# Patient Record
Sex: Male | Born: 1955 | Race: White | Hispanic: No | Marital: Married
Health system: Southern US, Academic
[De-identification: ages and names within clinical notes are randomized; demographics above are authoritative.]

## PROBLEM LIST (undated history)

## (undated) ENCOUNTER — Encounter

## (undated) ENCOUNTER — Ambulatory Visit: Payer: MEDICARE

## (undated) ENCOUNTER — Encounter
Attending: Student in an Organized Health Care Education/Training Program | Primary: Student in an Organized Health Care Education/Training Program

## (undated) ENCOUNTER — Ambulatory Visit

## (undated) ENCOUNTER — Telehealth

## (undated) ENCOUNTER — Telehealth
Attending: Student in an Organized Health Care Education/Training Program | Primary: Student in an Organized Health Care Education/Training Program

## (undated) ENCOUNTER — Ambulatory Visit: Payer: Medicare (Managed Care)

## (undated) ENCOUNTER — Ambulatory Visit
Payer: MEDICARE | Attending: Student in an Organized Health Care Education/Training Program | Primary: Student in an Organized Health Care Education/Training Program

## (undated) DIAGNOSIS — B019 Varicella without complication: Secondary | ICD-10-CM

## (undated) DIAGNOSIS — K5790 Diverticulosis of intestine, part unspecified, without perforation or abscess without bleeding: Secondary | ICD-10-CM

## (undated) DIAGNOSIS — C4491 Basal cell carcinoma of skin, unspecified: Secondary | ICD-10-CM

## (undated) DIAGNOSIS — K635 Polyp of colon: Secondary | ICD-10-CM

## (undated) DIAGNOSIS — K649 Unspecified hemorrhoids: Secondary | ICD-10-CM

## (undated) DIAGNOSIS — E785 Hyperlipidemia, unspecified: Secondary | ICD-10-CM

## (undated) HISTORY — DX: Basal cell carcinoma of skin, unspecified: C44.91

## (undated) HISTORY — DX: Unspecified hemorrhoids: K64.9

## (undated) HISTORY — DX: Polyp of colon: K63.5

## (undated) HISTORY — PX: COLONOSCOPY: SHX174

## (undated) HISTORY — DX: Varicella without complication: B01.9

---

## 1978-07-12 HISTORY — PX: ACHILLES TENDON REPAIR: SUR1153

## 2003-09-09 ENCOUNTER — Encounter (INDEPENDENT_AMBULATORY_CARE_PROVIDER_SITE_OTHER): Payer: Self-pay | Admitting: Specialist

## 2003-09-09 ENCOUNTER — Ambulatory Visit (HOSPITAL_COMMUNITY): Admission: RE | Admit: 2003-09-09 | Discharge: 2003-09-09 | Payer: Self-pay | Admitting: Gastroenterology

## 2007-07-17 ENCOUNTER — Ambulatory Visit: Payer: Self-pay | Admitting: Internal Medicine

## 2007-07-17 DIAGNOSIS — Z860100 Personal history of colon polyps, unspecified: Secondary | ICD-10-CM | POA: Insufficient documentation

## 2007-07-17 DIAGNOSIS — E7800 Pure hypercholesterolemia, unspecified: Secondary | ICD-10-CM | POA: Insufficient documentation

## 2007-07-19 LAB — CONVERTED CEMR LAB
Cholesterol: 194 mg/dL (ref 0–200)
LDL Cholesterol: 145 mg/dL — ABNORMAL HIGH (ref 0–99)
PSA: 1.54 ng/mL (ref 0.10–4.00)

## 2007-08-10 ENCOUNTER — Telehealth (INDEPENDENT_AMBULATORY_CARE_PROVIDER_SITE_OTHER): Payer: Self-pay | Admitting: *Deleted

## 2010-11-27 NOTE — Op Note (Signed)
NAME:  Larry Richardson, Larry Richardson                     ACCOUNT NO.:  [id number]   MEDICAL RECORD NO.:  [id number]                   PATIENT TYPE:  AMB   LOCATION:  ENDO                                 FACILITY:  [facility]   PHYSICIAN:  Graylin Shiver, M.D.                DATE OF BIRTH:  January 18, 1956   DATE OF PROCEDURE:  09/09/2003  DATE OF DISCHARGE:                                 OPERATIVE REPORT   PROCEDURE:  Colonoscopy with biopsy.   INDICATION:  History of colon polyps.   Informed consent was obtained after explanation of the risks of bleeding,  infection, and perforation.   PREMEDICATIONS:  1. Fentanyl 50 mcg IV.  2. Versed 6 mg IV.   DESCRIPTION OF PROCEDURE:  With the patient in the left lateral decubitus  position, a rectal exam was performed; no masses were felt.  The Olympus  colonoscope was inserted into the rectum and advanced around the colon to  the cecum.  Cecal landmarks were identified.  The cecum and ascending colon  were normal.  The transverse colon was normal.  The descending colon was  normal.  In the distal sigmoid, there was a small 2 mm polyp, biopsied off  with cold forceps.  The rectum was normal.  He tolerated the procedure well  without complications.   IMPRESSION:  Small sigmoid polyp.   PLAN:  The pathology will be checked.  I would recommend a follow-up colonoscopy again in 5 years.                                               Graylin Shiver, M.D.    Germain Osgood  D:  09/09/2003  T:  09/09/2003  Job:  [id number]

## 2014-02-01 ENCOUNTER — Other Ambulatory Visit: Payer: Self-pay | Admitting: Gastroenterology

## 2015-05-13 LAB — HEPATIC FUNCTION PANEL
ALK PHOS: 48 U/L (ref 25–125)
ALT: 21 U/L (ref 10–40)
AST: 26 U/L (ref 14–40)
Bilirubin, Total: 0.9 mg/dL

## 2015-05-13 LAB — HEMOGLOBIN A1C: HEMOGLOBIN A1C: 5.5 % (ref 4.0–6.0)

## 2015-05-13 LAB — BASIC METABOLIC PANEL
BUN: 17 mg/dL (ref 4–21)
CREATININE: 1 mg/dL (ref 0.6–1.3)
Glucose: 106 mg/dL
Potassium: 4.3 mmol/L (ref 3.4–5.3)
SODIUM: 142 mmol/L (ref 137–147)

## 2015-05-13 LAB — LIPID PANEL
CHOLESTEROL: 175 mg/dL (ref 0–200)
HDL: 41 mg/dL (ref 35–70)
LDL Cholesterol: 119 mg/dL
TRIGLYCERIDES: 76 mg/dL (ref 40–160)

## 2015-05-13 LAB — PSA: PSA: 1.8

## 2015-05-29 ENCOUNTER — Encounter: Payer: Self-pay | Admitting: Family Medicine

## 2015-05-29 ENCOUNTER — Ambulatory Visit (INDEPENDENT_AMBULATORY_CARE_PROVIDER_SITE_OTHER): Payer: BLUE CROSS/BLUE SHIELD | Admitting: Family Medicine

## 2015-05-29 VITALS — BP 110/66 | HR 81 | Temp 98.1°F | Ht 74.61 in | Wt 194.2 lb

## 2015-05-29 DIAGNOSIS — Z0000 Encounter for general adult medical examination without abnormal findings: Secondary | ICD-10-CM | POA: Diagnosis not present

## 2015-05-29 DIAGNOSIS — Z0001 Encounter for general adult medical examination with abnormal findings: Secondary | ICD-10-CM | POA: Insufficient documentation

## 2015-05-29 DIAGNOSIS — Z23 Encounter for immunization: Secondary | ICD-10-CM

## 2015-05-29 DIAGNOSIS — Z1159 Encounter for screening for other viral diseases: Secondary | ICD-10-CM

## 2015-05-29 DIAGNOSIS — Z114 Encounter for screening for human immunodeficiency virus [HIV]: Secondary | ICD-10-CM

## 2015-05-29 LAB — HIV ANTIBODY (ROUTINE TESTING W REFLEX): HIV: NONREACTIVE

## 2015-05-29 NOTE — Assessment & Plan Note (Signed)
Patient is doing well overall. His BMI is in the normal range. His blood pressures in the normal range. He is up-to-date on colonoscopies. He's had a flu shot this year. Zostavax last year. Tetanus shot was given today. Diet seems adequate. Exercise is good. Discussed continuing this. He had lab work recently drawn through [facility] and we will request these records from them. We will request records from his prior PCP as well. Follow-up in one year for physical.

## 2015-05-29 NOTE — Progress Notes (Signed)
Pre visit review using our clinic review tool, if applicable. No additional management support is needed unless otherwise documented below in the visit note. 

## 2015-05-29 NOTE — Patient Instructions (Signed)
Nice to meet you. Please continue exercising daily. Please continue her current diet. We will obtain some blood work today and request her lab work from [facility]. If there is anything abnormal we will call you, otherwise you will receive a letter in the mail.

## 2015-05-29 NOTE — Progress Notes (Signed)
Patient ID: Larry Richardson, male   DOB: 1955/12/22, 59 y.o.   MRN: [id number]  Larry Rumps, MD Phone: [phone]  Larry Richardson is a 59 y.o. male who presents today for new patient visit and physical exam.  Patient reports no complaints today. He states he is doing well. He had a colonoscopy in 2015. He reports prior colonoscopies have had polyps on them. He does not remember if the most recent colonoscopy had any polyps. He had a flu shot this year in October. He has not had a tetanus shot in greater than 10 years. He has not been checked for HIV or hepatitis C. Drinks wine 1-2 times per week. No tobacco use or illicit drug use. He sees an ophthalmologist yearly. Diet his reported to be pretty good. He does not eat any fried foods. He does eat red meat, chicken, and fish. He eats vegetables at dinner. He typically has a sandwich and fruit for lunch. Patient exercises daily by walking one hour day. He is currently competing against another couple to see who can get the most steps. Reports a family history of breast cancer in his mom. Prostate cancer in his grandfather. Leukemia in his father.  Active Ambulatory Problems    Diagnosis Date Noted  . HYPERLIPIDEMIA 07/17/2007  . COLONIC POLYPS, HX OF 07/17/2007  . Annual physical exam 05/29/2015   Resolved Ambulatory Problems    Diagnosis Date Noted  . No Resolved Ambulatory Problems   Past Medical History  Diagnosis Date  . Hemorrhoids   . Chickenpox   . Colon polyps     Family History  Problem Relation Age of Onset  . Breast cancer      Mom  . Prostate cancer      Grandfather  . Hyperlipidemia      Parent  . Hypertension      Parent  . Leukemia      Father    Social History   Social History  . Marital Status: Married    Spouse Name: N/A  . Number of Children: N/A  . Years of Education: N/A   Occupational History  . Not on file.   Social History Main Topics  . Smoking status: Never Smoker   .  Smokeless tobacco: Not on file  . Alcohol Use: 1.2 oz/week    2 Standard drinks or equivalent per week  . Drug Use: No  . Sexual Activity: Not on file   Other Topics Concern  . Not on file   Social History Narrative  . No narrative on file    ROS   General:  Negative for nexplained weight loss, fever Skin: Negative for new or changing mole, sore that won't heal HEENT: Negative for trouble hearing, trouble seeing, ringing in ears, mouth sores, hoarseness, change in voice, dysphagia. CV:  Negative for chest pain, dyspnea, edema, palpitations Resp: Negative for cough, dyspnea, hemoptysis GI: Negative for nausea, vomiting, diarrhea, constipation, abdominal pain, melena, hematochezia. GU: Negative for dysuria, incontinence, urinary hesitance, hematuria, vaginal or penile discharge, polyuria, sexual difficulty, lumps in testicle or breasts MSK: Negative for muscle cramps or aches, joint pain or swelling Neuro: Negative for headaches, weakness, numbness, dizziness, passing out/fainting Psych: Negative for depression, anxiety, memory problems  Objective  Physical Exam Filed Vitals:   05/29/15 1405  BP: 110/66  Pulse: 81  Temp: 98.1 F (36.7 C)    Physical Exam  Constitutional: He is well-developed, well-nourished, and in no distress.  HENT:  Head: Normocephalic and  atraumatic.  Right Ear: External ear normal.  Left Ear: External ear normal.  Mouth/Throat: Oropharynx is clear and moist. No oropharyngeal exudate.  Eyes: Conjunctivae are normal. Pupils are equal, round, and reactive to light.  Neck: Neck supple.  Cardiovascular: Normal rate, regular rhythm and normal heart sounds.  Exam reveals no gallop and no friction rub.   No murmur heard. Pulmonary/Chest: Effort normal and breath sounds normal. No respiratory distress. He has no wheezes. He has no rales.  Abdominal: Soft. Bowel sounds are normal. He exhibits no distension. There is no tenderness. There is no rebound and no  guarding.  Genitourinary:  Discussed rectal exam, patient opted to decline this today  Musculoskeletal: He exhibits no edema.  Lymphadenopathy:    He has no cervical adenopathy.  Neurological: He is alert. Gait normal.  Skin: Skin is warm and dry. He is not diaphoretic.  Psychiatric: Mood and affect normal.     Assessment/Plan:   Annual physical exam Patient is doing well overall. His BMI is in the normal range. His blood pressures in the normal range. He is up-to-date on colonoscopies. He's had a flu shot this year. Zostavax last year. Tetanus shot was given today. Diet seems adequate. Exercise is good. Discussed continuing this. He had lab work recently drawn through [facility] and we will request these records from them. We will request records from his prior PCP as well. Follow-up in one year for physical.    Orders Placed This Encounter  Procedures  . Tdap vaccine greater than or equal to 7yo IM  . HIV antibody (with reflex)  . Hepatitis C Antibody   Larry Richardson

## 2015-05-30 ENCOUNTER — Encounter: Payer: Self-pay | Admitting: Surgical

## 2015-05-30 LAB — HEPATITIS C ANTIBODY: HCV Ab: NEGATIVE

## 2015-06-04 ENCOUNTER — Telehealth: Payer: Self-pay | Admitting: Family Medicine

## 2015-06-04 MED ORDER — ATORVASTATIN CALCIUM 40 MG PO TABS: 40.0000 mg | ORAL_TABLET | Freq: Every day | ORAL | Status: DC

## 2015-06-04 NOTE — Telephone Encounter (Signed)
Spoke with patient regarding lab results that we received from [place]. advised that his cholesterol is mildly elevated. Advised that his ASCVD risk score was 8%. He is in a statin benefit group. We did discuss continuing diet and exercise, though patient opted for starting medication. We'll send and Lipitor. He will return in 1 month see how he is tolerating this medication.

## 2015-08-04 ENCOUNTER — Encounter: Payer: Self-pay | Admitting: Family Medicine

## 2015-08-04 ENCOUNTER — Ambulatory Visit (INDEPENDENT_AMBULATORY_CARE_PROVIDER_SITE_OTHER): Payer: BLUE CROSS/BLUE SHIELD | Admitting: Family Medicine

## 2015-08-04 VITALS — BP 104/66 | HR 73 | Temp 98.4°F | Ht 74.61 in | Wt 199.0 lb

## 2015-08-04 DIAGNOSIS — E7800 Pure hypercholesterolemia, unspecified: Secondary | ICD-10-CM | POA: Diagnosis not present

## 2015-08-04 DIAGNOSIS — J029 Acute pharyngitis, unspecified: Secondary | ICD-10-CM

## 2015-08-04 NOTE — Assessment & Plan Note (Signed)
Minimal sore throat that may be related to viral illness versus allergies. No focal findings on exam. He'll continue Zyrtec and start on Flonase. He is given return precautions.

## 2015-08-04 NOTE — Patient Instructions (Signed)
Nice to see you. We will check some lab work today and we'll call with the results. You can start on Flonase to help with your symptoms. Please continue Zyrtec. If you develop increasing congestion, cough productive of blood, shortness of breath, chest pain, fevers, or any new or changing symptoms please seek medical attention.

## 2015-08-04 NOTE — Progress Notes (Signed)
Patient ID: Larry Richardson, male   DOB: 27-Aug-1955, 60 y.o.   MRN: [id number]  Larry Rumps, MD Phone: [phone]  Larry Richardson is a 60 y.o. male who presents today for follow-up.  HYPERLIPIDEMIA Symptoms Chest pain on exertion:  No   Leg claudication:   No Medications: Compliance- taking Lipitor Right upper quadrant pain- no  Muscle aches- notes during the first 3 weeks of being on the medicine he felt stiff and achy. This is resolved and has not recurred in the last month. Has increased his exercise as well. Gets 12-18,000 steps a day. Is also watching what he eats. Has cut out fried foods. 1-2 sodas a day.  Sore throat: Patient notes starting with minimal sore throat yesterday. Mildly worse today. No postnasal drip, nasal congestion, cough, fever, shortness of breath, or ear symptoms. Notes his wife came home with the upper respiratory infection and he thinks he got it from her. Does have a history of allergic rhinitis. Take Zyrtec every day. Overall feels well.  PMH: nonsmoker.   ROS see history of present illness  Objective  Physical Exam Filed Vitals:   08/04/15 1454  BP: 104/66  Pulse: 73  Temp: 98.4 F (36.9 C)    Physical Exam  Constitutional: He is well-developed, well-nourished, and in no distress.  HENT:  Head: Normocephalic and atraumatic.  Right Ear: External ear normal.  Left Ear: External ear normal.  Mouth/Throat: Oropharynx is clear and moist. No oropharyngeal exudate.  Normal TMs bilaterally  Eyes: Conjunctivae are normal. Pupils are equal, round, and reactive to light.  Neck: Neck supple.  Cardiovascular: Normal rate, regular rhythm and normal heart sounds.  Exam reveals no gallop and no friction rub.   No murmur heard. Pulmonary/Chest: Effort normal and breath sounds normal. No respiratory distress. He has no wheezes. He has no rales.  Lymphadenopathy:    He has no cervical adenopathy.  Neurological: He is alert. Gait normal.  Skin:  Skin is warm and dry. He is not diaphoretic.     Assessment/Plan: Please see individual problem list.  Elevated LDL cholesterol level Patient with elevated LDL on last lab work. ASCVD risk score calculated at that time to be 8%. Patient has been on Lipitor since then. Did have some muscle aches, though this resolved. He is otherwise asymptomatic. We will continue Lipitor. We will check CMP, direct LDL, and CK.  Sore throat Minimal sore throat that may be related to viral illness versus allergies. No focal findings on exam. He'll continue Zyrtec and start on Flonase. He is given return precautions.    Orders Placed This Encounter  Procedures  . Direct LDL  . Comp Met (CMET)  . CK (Creatine Kinase)     Larry Richardson

## 2015-08-04 NOTE — Assessment & Plan Note (Addendum)
Patient with elevated LDL on last lab work. ASCVD risk score calculated at that time to be 8%. Patient has been on Lipitor since then. Did have some muscle aches, though this resolved. He is otherwise asymptomatic. We will continue Lipitor. We will check CMP, direct LDL, and CK.

## 2015-08-04 NOTE — Progress Notes (Signed)
Pre visit review using our clinic review tool, if applicable. No additional management support is needed unless otherwise documented below in the visit note. 

## 2015-08-05 ENCOUNTER — Telehealth: Payer: Self-pay | Admitting: Family Medicine

## 2015-08-05 DIAGNOSIS — R945 Abnormal results of liver function studies: Secondary | ICD-10-CM

## 2015-08-05 DIAGNOSIS — R7989 Other specified abnormal findings of blood chemistry: Secondary | ICD-10-CM

## 2015-08-05 DIAGNOSIS — R748 Abnormal levels of other serum enzymes: Secondary | ICD-10-CM

## 2015-08-05 LAB — COMPREHENSIVE METABOLIC PANEL
ALT: 34 U/L (ref 0–53)
AST: 40 U/L — AB (ref 0–37)
Albumin: 4.5 g/dL (ref 3.5–5.2)
Alkaline Phosphatase: 41 U/L (ref 39–117)
BILIRUBIN TOTAL: 1.5 mg/dL — AB (ref 0.2–1.2)
BUN: 15 mg/dL (ref 6–23)
CALCIUM: 9.2 mg/dL (ref 8.4–10.5)
CHLORIDE: 108 meq/L (ref 96–112)
CO2: 27 meq/L (ref 19–32)
CREATININE: 1.04 mg/dL (ref 0.40–1.50)
GFR: 77.28 mL/min (ref >60.00)
GLUCOSE: 100 mg/dL — AB (ref 70–99)
Potassium: 4.2 mEq/L (ref 3.5–5.1)
Sodium: 142 mEq/L (ref 135–145)
Total Protein: 6.1 g/dL (ref 6.0–8.3)

## 2015-08-05 LAB — LDL CHOLESTEROL, DIRECT: LDL DIRECT: 45 mg/dL

## 2015-08-05 LAB — CK: Total CK: 624 U/L — ABNORMAL HIGH (ref 7–232)

## 2015-08-05 NOTE — Telephone Encounter (Signed)
Called and spoke to patient regarding lab work. Advised that his CK level was elevated. Advised that this could be related to the Lipitor. We would need him to stop this today and come in later this week for recheck of the CK. Also advised that his liver function tests were minimally elevated. We will recheck these as well. Of note his LDL is improved.

## 2015-08-08 ENCOUNTER — Other Ambulatory Visit: Payer: BLUE CROSS/BLUE SHIELD

## 2015-08-08 DIAGNOSIS — R7989 Other specified abnormal findings of blood chemistry: Secondary | ICD-10-CM

## 2015-08-08 DIAGNOSIS — R748 Abnormal levels of other serum enzymes: Secondary | ICD-10-CM

## 2015-08-08 DIAGNOSIS — R945 Abnormal results of liver function studies: Secondary | ICD-10-CM

## 2015-08-08 LAB — COMPREHENSIVE METABOLIC PANEL
ALT: 34 U/L (ref 9–46)
AST: 31 U/L (ref 10–35)
Albumin: 4.4 g/dL (ref 3.6–5.1)
Alkaline Phosphatase: 44 U/L (ref 40–115)
BUN: 17 mg/dL (ref 7–25)
CHLORIDE: 104 mmol/L (ref 98–110)
CO2: 29 mmol/L (ref 20–31)
Calcium: 9.1 mg/dL (ref 8.6–10.3)
Creat: 1.14 mg/dL (ref 0.70–1.25)
Glucose, Bld: 81 mg/dL (ref 65–99)
POTASSIUM: 4.3 mmol/L (ref 3.5–5.3)
SODIUM: 138 mmol/L (ref 135–146)
TOTAL PROTEIN: 6.2 g/dL (ref 6.1–8.1)
Total Bilirubin: 1.7 mg/dL — ABNORMAL HIGH (ref 0.2–1.2)

## 2015-08-08 LAB — CK: CK TOTAL: 316 U/L — AB (ref 7–232)

## 2015-09-15 ENCOUNTER — Encounter: Payer: Self-pay | Admitting: Family Medicine

## 2015-09-15 ENCOUNTER — Ambulatory Visit (INDEPENDENT_AMBULATORY_CARE_PROVIDER_SITE_OTHER): Payer: BLUE CROSS/BLUE SHIELD | Admitting: Family Medicine

## 2015-09-15 VITALS — BP 124/72 | HR 56 | Temp 97.7°F | Ht 74.61 in | Wt 196.2 lb

## 2015-09-15 DIAGNOSIS — Z889 Allergy status to unspecified drugs, medicaments and biological substances status: Secondary | ICD-10-CM

## 2015-09-15 DIAGNOSIS — E7800 Pure hypercholesterolemia, unspecified: Secondary | ICD-10-CM | POA: Diagnosis not present

## 2015-09-15 DIAGNOSIS — Z789 Other specified health status: Secondary | ICD-10-CM

## 2015-09-15 NOTE — Assessment & Plan Note (Addendum)
Most recent in the normal range. Has recently come off the statin that was likely contributing to his decreased LDL. We will recheck his LDL today given that it has been greater than a month off the statin. We will also recheck a CK and CMP to ensure that these have returned to normal. He will continue off the Lipitor. He will continue diet and exercise. We'll see him back in 3 months.

## 2015-09-15 NOTE — Progress Notes (Signed)
Patient ID: ALIM CATTELL, male   DOB: Mar 13, 1956, 60 y.o.   MRN: [id number]  [name], MD Phone: [phone]  Larry Richardson is a 60 y.o. male who presents today for follow-up.  Elevated LDL: Patient noted to have prior elevated LDL. He was placed on statin and had some muscle aches and elevation of CK and minimal elevation of LFTs. He denies muscle aches at this time. No right upper quadrant pain. He is not currently taking a statin. Notes he's been working on diet and exercise. He has increased his exercise significantly [facility] Does treadmill for an hour per day. Lifts weights every other day. Diet is improved. Eats bacon one time per week. Lots of salads with minimal dressing. Toast for breakfast. Ricky sandwich, raisins, banana, and other fruit for lunch. Typically a meat and vegetable for supper. Some red meat.  PMH: nonsmoker.   ROS see history of present illness  Objective  Physical Exam Filed Vitals:   09/15/15 1602  BP: 124/72  Pulse: 56  Temp: 97.7 F (36.5 C)    BP Readings from Last 3 Encounters:  09/15/15 124/72  08/04/15 104/66  05/29/15 110/66   Wt Readings from Last 3 Encounters:  09/15/15 196 lb 4 oz (89.018 kg)  08/04/15 199 lb (90.266 kg)  05/29/15 194 lb 3.2 oz (88.089 kg)    Physical Exam  Constitutional: He is well-developed, well-nourished, and in no distress.  Cardiovascular: Normal rate, regular rhythm and normal heart sounds.  Exam reveals no gallop and no friction rub.   No murmur heard. Pulmonary/Chest: Effort normal and breath sounds normal. No respiratory distress. He has no wheezes. He has no rales.  Neurological: He is alert. Gait normal.  Skin: Skin is warm and dry. He is not diaphoretic.     Assessment/Plan: Please see individual problem list.  Elevated LDL cholesterol level Most recent in the normal range. Has recently come off the statin that was likely contributing to his decreased LDL. We  will recheck his LDL today given that it has been greater than a month off the statin. We will also recheck a CK and CMP to ensure that these have returned to normal. He will continue off the Lipitor. He will continue diet and exercise. We'll see him back in 3 months.    Orders Placed This Encounter  Procedures  . CK (Creatine Kinase)  . Direct LDL  . Comp Met (CMET)     [name], MD [place]

## 2015-09-15 NOTE — Progress Notes (Signed)
Pre visit review using our clinic review tool, if applicable. No additional management support is needed unless otherwise documented below in the visit note. 

## 2015-09-15 NOTE — Patient Instructions (Signed)
Nice to see you. You are doing a great job with her diet and exercise. Please continue your current regimen. We will check lab work today. If you develop muscle aches, abdominal pain, chest pain, shortness breath, or any new or changing symptoms please seek medical attention.

## 2015-09-16 LAB — COMPREHENSIVE METABOLIC PANEL
ALBUMIN: 4.5 g/dL (ref 3.5–5.2)
ALT: 22 U/L (ref 0–53)
AST: 24 U/L (ref 0–37)
Alkaline Phosphatase: 41 U/L (ref 39–117)
BUN: 16 mg/dL (ref 6–23)
CHLORIDE: 107 meq/L (ref 96–112)
CO2: 24 mEq/L (ref 19–32)
CREATININE: 1.04 mg/dL (ref 0.40–1.50)
Calcium: 9.2 mg/dL (ref 8.4–10.5)
GFR: 77.25 mL/min (ref >60.00)
GLUCOSE: 95 mg/dL (ref 70–99)
Potassium: 4.1 mEq/L (ref 3.5–5.1)
SODIUM: 141 meq/L (ref 135–145)
Total Bilirubin: 1.2 mg/dL (ref 0.2–1.2)
Total Protein: 6.2 g/dL (ref 6.0–8.3)

## 2015-09-16 LAB — CK: Total CK: 159 U/L (ref 7–232)

## 2015-09-16 LAB — LDL CHOLESTEROL, DIRECT: LDL DIRECT: 117 mg/dL

## 2015-11-05 ENCOUNTER — Ambulatory Visit (INDEPENDENT_AMBULATORY_CARE_PROVIDER_SITE_OTHER): Payer: BLUE CROSS/BLUE SHIELD

## 2015-11-05 ENCOUNTER — Ambulatory Visit (INDEPENDENT_AMBULATORY_CARE_PROVIDER_SITE_OTHER): Payer: BLUE CROSS/BLUE SHIELD | Admitting: Podiatry

## 2015-11-05 ENCOUNTER — Encounter: Payer: Self-pay | Admitting: Podiatry

## 2015-11-05 VITALS — BP 120/84 | HR 74 | Resp 16

## 2015-11-05 DIAGNOSIS — M722 Plantar fascial fibromatosis: Secondary | ICD-10-CM | POA: Diagnosis not present

## 2015-11-05 MED ORDER — TRIAMCINOLONE ACETONIDE 10 MG/ML IJ SUSP
10.0000 mg | Freq: Once | INTRAMUSCULAR | Status: AC
  Administered 2015-11-05: 10 mg

## 2015-11-05 NOTE — Patient Instructions (Addendum)

## 2015-11-05 NOTE — Progress Notes (Signed)
Subjective:     Patient ID: Larry Richardson, male   DOB: [birth year], 61 y.o.   MRN: [id number]  HPI patient states I started develop quite a bit of pain in my left heel again and it's mostly on the outside of the heel and I've had shockwave in the past but I think it was my right heel   Review of Systems  All other systems reviewed and are negative.      Objective:   Physical Exam  Constitutional: He is oriented to person, place, and time.  Cardiovascular: Intact distal pulses.   Musculoskeletal: Normal range of motion.  Neurological: He is oriented to person, place, and time.  Skin: Skin is warm.  Nursing note and vitals reviewed.  neurovascular status intact muscle strength adequate range of motion within normal limits with patient found to have discomfort in the plantar lateral aspect of the left heel with inflammation and fluid buildup around the central and lateral band. Patient is a high arch foot type and is had orthotics in the past which wore out but were beneficial and helped him     Assessment:     Plantar fasciitis of the plantar lateral left heel with mild central band involvement    Plan:     H&P conditions reviewed and today careful injection administered lateral side 3 mg Kenalog 5 mg Xylocaine and scanned for custom orthotics to reduce all pressure against his feet  X-ray report indicated spur formation with high arch foot type and no indication of stress fracture

## 2015-11-05 NOTE — Progress Notes (Signed)
   Subjective:    Patient ID: Larry Richardson, male    DOB: 1955/09/29, 60 y.o.   MRN: [id number]  HPI    Review of Systems  All other systems reviewed and are negative.      Objective:   Physical Exam        Assessment & Plan:

## 2015-11-26 ENCOUNTER — Encounter: Payer: Self-pay | Admitting: Podiatry

## 2015-11-26 ENCOUNTER — Ambulatory Visit (INDEPENDENT_AMBULATORY_CARE_PROVIDER_SITE_OTHER): Payer: BLUE CROSS/BLUE SHIELD | Admitting: Podiatry

## 2015-11-26 DIAGNOSIS — M722 Plantar fascial fibromatosis: Secondary | ICD-10-CM

## 2015-11-26 NOTE — Progress Notes (Signed)
   Subjective:    Patient ID: Larry Richardson, male    DOB: 1955-07-13, 60 y.o.   MRN: [id number]  HPI "I'm here to pick up my orthotics."  Patient presents to pick up orthotics, instructions were given.    Review of Systems     Objective:   Physical Exam        Assessment & Plan:

## 2015-11-26 NOTE — Patient Instructions (Signed)

## 2015-11-26 NOTE — Progress Notes (Signed)
Subjective:     Patient ID: Larry Richardson, male   DOB: [birth year], 60 y.o.   MRN: [id number]  HPI patient presents stating the pain in my heel is still present but improved quite dramatically   Review of Systems     Objective:   Physical Exam Neurovascular status intact muscle strength adequate with significant diminishment of discomfort plantar central and lateral aspect of the heel    Assessment:     Improved fasciitis symptoms    Plan:     Discussed physical therapy anti-inflammatory and dispensed orthotics which feel good to the patient. Patient will be seen back as needed and may require further treatment in the future

## 2016-05-31 ENCOUNTER — Encounter: Payer: BLUE CROSS/BLUE SHIELD | Admitting: Family Medicine

## 2016-08-09 ENCOUNTER — Encounter: Payer: Self-pay | Admitting: Family Medicine

## 2016-08-09 ENCOUNTER — Telehealth: Payer: Self-pay | Admitting: Family Medicine

## 2016-08-09 ENCOUNTER — Ambulatory Visit (INDEPENDENT_AMBULATORY_CARE_PROVIDER_SITE_OTHER): Payer: BLUE CROSS/BLUE SHIELD | Admitting: Family Medicine

## 2016-08-09 VITALS — BP 116/84 | HR 67 | Temp 98.4°F | Ht 75.5 in | Wt 204.8 lb

## 2016-08-09 DIAGNOSIS — R7309 Other abnormal glucose: Secondary | ICD-10-CM

## 2016-08-09 DIAGNOSIS — Z0001 Encounter for general adult medical examination with abnormal findings: Secondary | ICD-10-CM | POA: Diagnosis not present

## 2016-08-09 NOTE — Patient Instructions (Signed)
Nice to see you. Please start exercising again. Please contact us with the name of your GI physician who performed your colonoscopy. Please monitor your nasal congestion and continue the Zyrtec, Mucinex, and Flonase. You can also try Tylenol or ibuprofen for any discomfort.

## 2016-08-09 NOTE — Assessment & Plan Note (Signed)
Physical exam completed today. Overall doing well. Stressed diet and exercise. He'll contact us with the name of his GI physician so we can request colonoscopy reports. His work health assessment will be scanned into the system. Discussed continuing Zyrtec, Mucinex, and Flonase for likely viral upper respiratory infection. He will continue to monitor that and if it does not improve he'll let us know. Further lab work as outlined below.

## 2016-08-09 NOTE — Progress Notes (Signed)
Pre visit review using our clinic review tool, if applicable. No additional management support is needed unless otherwise documented below in the visit note. 

## 2016-08-09 NOTE — Progress Notes (Signed)
Larry Rumps, MD Phone: [phone]  Larry Richardson is a 61 y.o. male who presents today for physical exam.  Has not been exercising as much as he got plantar fasciitis though this is improved and he plans to start exercising. Diet has been relatively good. Not eating any fried foods. Trying to eat vegetables and fruits and healthy meats. Reports most recent colonoscopy was several years ago. He'll call us with the name of the doctor. PSA checked recently with health screening through work and it was 2.9. Up-to-date on vaccinations and HIV testing and hepatitis C. No tobacco use or illicit drug use. Drinks about a glass of wine a week. Sees a dentist and an ophthalmologist. Patient notes some mild sore throat and congestion with ear fullness and postnasal drip over the last several days. Blowing clear mucus out of his nose. Has tried Zyrtec, Mucinex, and Flonase. These were somewhat beneficial.  Active Ambulatory Problems    Diagnosis Date Noted  . Elevated LDL cholesterol level 07/17/2007  . COLONIC POLYPS, HX OF 07/17/2007  . Encounter for general adult medical examination with abnormal findings 05/29/2015  . Sore throat 08/04/2015   Resolved Ambulatory Problems    Diagnosis Date Noted  . No Resolved Ambulatory Problems   Past Medical History:  Diagnosis Date  . Chickenpox   . Colon polyps   . Hemorrhoids     Family History  Problem Relation Age of Onset  . Breast cancer      Mom  . Prostate cancer      Grandfather  . Hyperlipidemia      Parent  . Hypertension      Parent  . Leukemia      Father    Social History   Social History  . Marital status: Married    Spouse name: N/A  . Number of children: N/A  . Years of education: N/A   Occupational History  . Not on file.   Social History Main Topics  . Smoking status: Never Smoker  . Smokeless tobacco: Never Used  . Alcohol use 1.2 oz/week    2 Standard drinks or equivalent per week  . Drug use:  No  . Sexual activity: Not on file   Other Topics Concern  . Not on file   Social History Narrative  . No narrative on file    ROS  General:  Negative for nexplained weight loss, fever Skin: Negative for new or changing mole, sore that won't heal HEENT: Negative for trouble hearing, trouble seeing, ringing in ears, mouth sores, hoarseness, change in voice, dysphagia. CV:  Negative for chest pain, dyspnea, edema, palpitations Resp: Negative for cough, dyspnea, hemoptysis GI: Negative for nausea, vomiting, diarrhea, constipation, abdominal pain, melena, hematochezia. GU: Negative for dysuria, incontinence, urinary hesitance, hematuria, vaginal or penile discharge, polyuria, sexual difficulty, lumps in testicle or breasts MSK: Negative for muscle cramps or aches, joint pain or swelling Neuro: Negative for headaches, weakness, numbness, dizziness, passing out/fainting Psych: Negative for depression, anxiety, memory problems  Objective  Physical Exam Vitals:   08/09/16 1538  BP: 116/84  Pulse: 67  Temp: 98.4 F (36.9 C)    BP Readings from Last 3 Encounters:  08/09/16 116/84  11/05/15 120/84  09/15/15 124/72   Wt Readings from Last 3 Encounters:  08/09/16 204 lb 12.8 oz (92.9 kg)  09/15/15 196 lb 4 oz (89 kg)  08/04/15 199 lb (90.3 kg)    Physical Exam  Constitutional: No distress.  HENT:  Head: Normocephalic  and atraumatic.  Mouth/Throat: Oropharynx is clear and moist. No oropharyngeal exudate.  Normal TMs bilaterally  Eyes: Conjunctivae are normal. Pupils are equal, round, and reactive to light.  Neck: Neck supple.  Cardiovascular: Normal rate, regular rhythm and normal heart sounds.   Pulmonary/Chest: Effort normal and breath sounds normal.  Abdominal: Soft. Bowel sounds are normal. He exhibits no distension. There is no tenderness. There is no rebound and no guarding.  Musculoskeletal: He exhibits no edema.  Lymphadenopathy:    He has no cervical adenopathy.    Neurological: He is alert. Gait normal.  Skin: Skin is warm and dry. He is not diaphoretic.  Psychiatric: Mood and affect normal.     Assessment/Plan:   Encounter for general adult medical examination with abnormal findings Physical exam completed today. Overall doing well. Stressed diet and exercise. He'll contact us with the name of his GI physician so we can request colonoscopy reports. His work health assessment will be scanned into the system. Discussed continuing Zyrtec, Mucinex, and Flonase for likely viral upper respiratory infection. He will continue to monitor that and if it does not improve he'll let us know. Further lab work as outlined below.   Orders Placed This Encounter  Procedures  . HgB A1c  . Comp Met (CMET)    Larry Rumps, MD [place]

## 2016-08-09 NOTE — Telephone Encounter (Signed)
Pt called back in stating he was advised by Dr Caryl Bis to call and give some information. Pt colonscopy was done on 02/01/2014 at [facility] endoscopy center Dr Anson Fret. Thank you!

## 2016-08-10 LAB — HEMOGLOBIN A1C: Hgb A1c MFr Bld: 5.5 % (ref 4.6–6.5)

## 2016-08-10 LAB — COMPREHENSIVE METABOLIC PANEL
ALBUMIN: 4.7 g/dL (ref 3.5–5.2)
ALT: 19 U/L (ref 0–53)
AST: 22 U/L (ref 0–37)
Alkaline Phosphatase: 44 U/L (ref 39–117)
BILIRUBIN TOTAL: 0.9 mg/dL (ref 0.2–1.2)
BUN: 13 mg/dL (ref 6–23)
CALCIUM: 9.5 mg/dL (ref 8.4–10.5)
CO2: 33 mEq/L — ABNORMAL HIGH (ref 19–32)
Chloride: 108 mEq/L (ref 96–112)
Creatinine, Ser: 1.16 mg/dL (ref 0.40–1.50)
GFR: 67.9 mL/min (ref >60.00)
GLUCOSE: 106 mg/dL — AB (ref 70–99)
POTASSIUM: 4.4 meq/L (ref 3.5–5.1)
Sodium: 142 mEq/L (ref 135–145)
TOTAL PROTEIN: 6.8 g/dL (ref 6.0–8.3)

## 2016-08-10 NOTE — Telephone Encounter (Signed)
Noted and faxed.  

## 2017-09-26 ENCOUNTER — Encounter: Payer: Self-pay | Admitting: Podiatry

## 2017-09-26 ENCOUNTER — Ambulatory Visit: Payer: BLUE CROSS/BLUE SHIELD | Admitting: Podiatry

## 2017-09-26 DIAGNOSIS — L600 Ingrowing nail: Secondary | ICD-10-CM | POA: Diagnosis not present

## 2017-09-26 DIAGNOSIS — B351 Tinea unguium: Secondary | ICD-10-CM | POA: Diagnosis not present

## 2017-09-26 NOTE — Progress Notes (Signed)
Subjective:   Patient ID: Larry Richardson, male   DOB: 62 y.o.   MRN: [id number]   HPI Patient presents with 2 yellow toenails and the hallux nail being loose on the right foot with approximate 6-9 months duration   ROS      Objective:  Physical Exam  Neurovascular status intact with patient hallux nail being discolored in the distal two thirds of the nail bed with looseness of the nail bed noted and no current drainage with patient also noted to have discoloration of the fifth nail     Assessment:  Combination mycotic nail infection along with probability for trauma to the right hallux     Plan:  Discussed treatment options and I do not recommend treatment as it is not hurting now it appears to be more of a soft tissue nature and related to trauma.  May require removal of the nail at one point future possible treatment with antifungal depending on how it presents but at this time it appears to be mostly trauma

## 2017-09-27 ENCOUNTER — Encounter: Payer: BLUE CROSS/BLUE SHIELD | Admitting: Family Medicine

## 2018-03-03 ENCOUNTER — Telehealth: Payer: Self-pay

## 2018-03-03 NOTE — Telephone Encounter (Signed)
Patient notified that he does not need another tdap at this time

## 2018-03-03 NOTE — Telephone Encounter (Signed)
Copied from [place] [id number]. Topic: Inquiry >> Mar 03, 2018 11:06 AM Mylinda Latina, NT wrote: Reason for CRM:  Patient wife called and states they are having a grand baby soon and he is wondering if he needs another Tdap. He got his Tdap in 2016 please call  CB# [phone] leave a detailed message on voicemail

## 2020-05-16 LAB — HM COLONOSCOPY

## 2020-06-30 ENCOUNTER — Encounter: Payer: Self-pay | Admitting: Family Medicine

## 2020-06-30 ENCOUNTER — Ambulatory Visit (INDEPENDENT_AMBULATORY_CARE_PROVIDER_SITE_OTHER): Payer: Medicare HMO | Admitting: Family Medicine

## 2020-06-30 ENCOUNTER — Other Ambulatory Visit: Payer: Self-pay

## 2020-06-30 VITALS — BP 118/70 | HR 68 | Temp 97.8°F | Ht 74.0 in | Wt 208.2 lb

## 2020-06-30 DIAGNOSIS — Z23 Encounter for immunization: Secondary | ICD-10-CM | POA: Diagnosis not present

## 2020-06-30 DIAGNOSIS — Z125 Encounter for screening for malignant neoplasm of prostate: Secondary | ICD-10-CM

## 2020-06-30 DIAGNOSIS — M7918 Myalgia, other site: Secondary | ICD-10-CM | POA: Diagnosis not present

## 2020-06-30 DIAGNOSIS — E7800 Pure hypercholesterolemia, unspecified: Secondary | ICD-10-CM | POA: Diagnosis not present

## 2020-06-30 DIAGNOSIS — Z130 Encounter for screening for diseases of the blood and blood-forming organs and certain disorders involving the immune mechanism: Secondary | ICD-10-CM | POA: Diagnosis not present

## 2020-06-30 DIAGNOSIS — E663 Overweight: Secondary | ICD-10-CM

## 2020-06-30 DIAGNOSIS — B351 Tinea unguium: Secondary | ICD-10-CM | POA: Insufficient documentation

## 2020-06-30 DIAGNOSIS — Z1329 Encounter for screening for other suspected endocrine disorder: Secondary | ICD-10-CM

## 2020-06-30 DIAGNOSIS — Z0001 Encounter for general adult medical examination with abnormal findings: Secondary | ICD-10-CM | POA: Diagnosis not present

## 2020-06-30 NOTE — Progress Notes (Signed)
Marikay Alar, MD Phone: [phone]  Larry Richardson is a 64 y.o. male who presents today for CPE.  Diet: Generally healthy.  No fried foods.  Eats plenty of fruits and vegetables.  Eats a variety of meats.  One soda per day. Exercise: Exercises 4 days a week by doing cardio and weightlifting. Colonoscopy: Reports he had this done sometime in the last month. Prostate cancer screening: Due Family history-  Prostate cancer: No  Colon cancer: No Vaccines-   Flu: Up-to-date  Tetanus: Up-to-date  Shingles: Up-to-date  COVID19: Up-to-date  Pneumonia: Due HIV screening: Up-to-date Hep C Screening: Up-to-date Tobacco use: No Alcohol use: Rare Illicit Drug use: No  Dentist: Yes Ophthalmology: Yes  Right buttock pain: This has been going on intermittently for years.  Currently bothers him more if he is blowing leaves or after he uses it.  He will do some squats and that will help.  He takes Aleve and that resolves symptoms.  Onychomycosis: Patient notes his right toenail is thickened and discolored.  Its been this way for some time.  He wanted to make sure this was onychomycosis.   Active Ambulatory Problems    Diagnosis Date Noted  . Elevated LDL cholesterol level 07/17/2007  . COLONIC POLYPS, HX OF 07/17/2007  . Encounter for general adult medical examination with abnormal findings 05/29/2015  . Right buttock pain 06/30/2020  . Onychomycosis 06/30/2020   Resolved Ambulatory Problems    Diagnosis Date Noted  . Sore throat 08/04/2015   Past Medical History:  Diagnosis Date  . Chickenpox   . Colon polyps   . Hemorrhoids     Family History  Problem Relation Age of Onset  . Breast cancer Unknown        Mom  . Prostate cancer Unknown        Grandfather  . Hyperlipidemia Unknown        Parent  . Hypertension Unknown        Parent  . Leukemia Unknown        Father    Social History   Socioeconomic History  . Marital status: Married    Spouse name: Not on  file  . Number of children: Not on file  . Years of education: Not on file  . Highest education level: Not on file  Occupational History  . Not on file  Tobacco Use  . Smoking status: Never Smoker  . Smokeless tobacco: Never Used  Substance and Sexual Activity  . Alcohol use: Yes    Alcohol/week: 2.0 standard drinks    Types: 2 Standard drinks or equivalent per week  . Drug use: No  . Sexual activity: Not on file  Other Topics Concern  . Not on file  Social History Narrative  . Not on file   Social Determinants of Health   Financial Resource Strain: Not on file  Food Insecurity: Not on file  Transportation Needs: Not on file  Physical Activity: Not on file  Stress: Not on file  Social Connections: Not on file  Intimate Partner Violence: Not on file    ROS  General:  Negative for nexplained weight loss, fever Skin: Negative for new or changing mole, sore that won't heal HEENT: Negative for trouble hearing, trouble seeing, ringing in ears, mouth sores, hoarseness, change in voice, dysphagia. CV:  Negative for chest pain, dyspnea, edema, palpitations Resp: Negative for cough, dyspnea, hemoptysis GI: Negative for nausea, vomiting, diarrhea, constipation, abdominal pain, melena, hematochezia. GU: Negative for dysuria, incontinence,  urinary hesitance, hematuria, vaginal or penile discharge, polyuria, sexual difficulty, lumps in testicle or breasts MSK: Negative for muscle cramps or aches, joint pain or swelling Neuro: Negative for headaches, weakness, numbness, dizziness, passing out/fainting Psych: Negative for depression, anxiety, memory problems  Objective  Physical Exam Vitals:   06/30/20 1449  BP: 118/70  Pulse: 68  Temp: 97.8 F (36.6 C)  SpO2: 99%    BP Readings from Last 3 Encounters:  06/30/20 118/70  08/09/16 116/84  11/05/15 120/84   Wt Readings from Last 3 Encounters:  06/30/20 208 lb 3.2 oz (94.4 kg)  08/09/16 204 lb 12.8 oz (92.9 kg)  09/15/15  196 lb 4 oz (89 kg)    Physical Exam Constitutional:      General: He is not in acute distress.    Appearance: He is not diaphoretic.  HENT:     Head: Normocephalic and atraumatic.  Eyes:     Conjunctiva/sclera: Conjunctivae normal.     Pupils: Pupils are equal, round, and reactive to light.  Cardiovascular:     Rate and Rhythm: Normal rate and regular rhythm.     Heart sounds: Normal heart sounds.  Pulmonary:     Effort: Pulmonary effort is normal.     Breath sounds: Normal breath sounds.  Abdominal:     General: Bowel sounds are normal. There is no distension.     Palpations: Abdomen is soft.     Tenderness: There is no abdominal tenderness. There is no guarding or rebound.  Musculoskeletal:        General: No edema.     Comments: Right buttocks with no tenderness  Lymphadenopathy:     Cervical: No cervical adenopathy.  Skin:    General: Skin is warm and dry.     Comments: Right great toenail thickened with yellow/darkened discoloration  Neurological:     Mental Status: He is alert.      Assessment/Plan:   Problem List Items Addressed This Visit    Elevated LDL cholesterol level   Relevant Orders   Comp Met (CMET)   Lipid panel   Encounter for general adult medical examination with abnormal findings - Primary    Physical exam completed.  Encouraged continued healthy diet and exercise.  Discussed decreasing sodas.  We will confirm vaccine history with his pharmacy.  Prevnar given today.  We will request colonoscopy records.  Lab work as outlined.      Onychomycosis    Reassurance provided.  Discussed that we could treat with topical medication though they are not terribly effective.  Discussed oral Lamisil though I did note the risk to the liver with this medication and he declined.  He will monitor.      Right buttock pain    Likely muscular strain.  We will have him complete exercises for this and if not improving he will let us know.  Discussed the next step  would be seeing sports medicine or orthopedics.       Other Visit Diagnoses    Overweight       Relevant Orders   HgB A1c   Screening for deficiency anemia       Relevant Orders   CBC   Thyroid disorder screen       Relevant Orders   TSH   Prostate cancer screening       Relevant Orders   PSA, Medicare ( [place] only)      This visit occurred during the SARS-CoV-2 public health emergency.  Safety protocols were in place, including screening questions prior to the visit, additional usage of staff PPE, and extensive cleaning of exam room while observing appropriate contact time as indicated for disinfecting solutions.    [name], MD [place]

## 2020-06-30 NOTE — Patient Instructions (Signed)
Nice to see you. Please complete the exercises. We will contact you with your lab results.   Piriformis Syndrome Rehab Ask your health care provider which exercises are safe for you. Do exercises exactly as told by your health care provider and adjust them as directed. It is normal to feel mild stretching, pulling, tightness, or discomfort as you do these exercises. Stop right away if you feel sudden pain or your pain gets worse. Do not begin these exercises until told by your health care provider. Stretching and range-of-motion exercises These exercises warm up your muscles and joints and improve the movement and flexibility of your hip and pelvis. The exercises also help to relieve pain, numbness, and tingling. Hip rotation This is an exercise in which you lie on your back and stretch the muscles that rotate your hip (hip rotators) to stretch your buttocks. 1. Lie on your back on a firm surface. 2. Pull your left / right knee toward your same shoulder with your left / right hand until your knee is pointing toward the ceiling. Hold your left / right ankle with your other hand. 3. Keeping your knee steady, gently pull your left / right ankle toward your other shoulder until you feel a stretch in your buttocks. 4. Hold this position for __________ seconds. Repeat __________ times. Complete this exercise __________ times a day. Hip extensor This is an exercise in which you lie on your back and pull your knee to your chest. 1. Lie on your back on a firm surface. Both of your legs should be straight. 2. Pull your left / right knee to your chest. Hold your leg in this position by holding onto the back of your thigh or the front of your knee. 3. Hold this position for __________ seconds. 4. Slowly return to the starting position. Repeat __________ times. Complete this exercise __________ times a day. Strengthening exercises These exercises build strength and endurance in your hip and thigh muscles.  Endurance is the ability to use your muscles for a long time, even after they get tired. Straight leg raises, side-lying This exercise strengthens the muscles that rotate the leg at the hip and move it away from your body (hip abductors). 1. Lie on your side with your left / right leg in the top position. Lie so your head, shoulder, knee, and hip line up. Bend your bottom knee to help you balance. 2. Lift your top leg 4-6 inches (10-15 cm) while keeping your toes pointed straight ahead. 3. Hold this position for __________ seconds. 4. Slowly lower your leg to the starting position. 5. Let your muscles relax completely after each repetition. Repeat __________ times. Complete this exercise __________ times a day. Hip abduction and rotation This is sometimes called quadruped (on hands and knees) exercises. 1. Get on your hands and knees on a firm, lightly padded surface. Your hands should be directly below your shoulders, and your knees should be directly below your hips. 2. Lift your left / right knee out to the side. Keep your knee bent. Do not twist your body. 3. Hold this position for __________ seconds. 4. Slowly lower your leg. Repeat __________ times. Complete this exercise __________ times a day. Straight leg raises, face-down This exercise stretches the muscles that move your hips away from the front of the pelvis (hip extensors). 1. Lie on your abdomen on a bed or a firm surface with a pillow under your hips. 2. Squeeze your buttocks muscles and lift your left /  right leg about 4-6 inches (10-15 cm) off the bed. Do not let your back arch. 3. Hold this position for __________ seconds. 4. Slowly lower your leg to the starting position. 5. Let your muscles relax completely after each repetition. Repeat __________ times. Complete this exercise __________ times a day. This information is not intended to replace advice given to you by your health care provider. Make sure you discuss any  questions you have with your health care provider. Document Revised: 10/19/2018 Document Reviewed: 04/20/2018 Elsevier Patient Education  [place].

## 2020-06-30 NOTE — Assessment & Plan Note (Signed)
Physical exam completed.  Encouraged continued healthy diet and exercise.  Discussed decreasing sodas.  We will confirm vaccine history with his pharmacy.  Prevnar given today.  We will request colonoscopy records.  Lab work as outlined.

## 2020-06-30 NOTE — Addendum Note (Signed)
Addended by: Fulton Mole D on: 06/30/2020 03:45 PM   Modules accepted: Orders

## 2020-06-30 NOTE — Assessment & Plan Note (Addendum)
Likely muscular strain.  We will have him complete exercises for this and if not improving he will let us know.  Discussed the next step would be seeing sports medicine or orthopedics.

## 2020-06-30 NOTE — Assessment & Plan Note (Signed)
Reassurance provided.  Discussed that we could treat with topical medication though they are not terribly effective.  Discussed oral Lamisil though I did note the risk to the liver with this medication and he declined.  He will monitor.

## 2020-07-01 LAB — LIPID PANEL
Cholesterol: 199 mg/dL (ref 0–200)
HDL: 33 mg/dL — ABNORMAL LOW (ref >39.00)
LDL Cholesterol: 144 mg/dL — ABNORMAL HIGH (ref 0–99)
NonHDL: 166.09
Total CHOL/HDL Ratio: 6
Triglycerides: 110 mg/dL (ref 0.0–149.0)
VLDL: 22 mg/dL (ref 0.0–40.0)

## 2020-07-01 LAB — COMPREHENSIVE METABOLIC PANEL
ALT: 23 U/L (ref 0–53)
AST: 25 U/L (ref 0–37)
Albumin: 4.7 g/dL (ref 3.5–5.2)
Alkaline Phosphatase: 55 U/L (ref 39–117)
BUN: 18 mg/dL (ref 6–23)
CO2: 31 mEq/L (ref 19–32)
Calcium: 9.6 mg/dL (ref 8.4–10.5)
Chloride: 105 mEq/L (ref 96–112)
Creatinine, Ser: 1.18 mg/dL (ref 0.40–1.50)
GFR: 64.78 mL/min (ref >60.00)
Glucose, Bld: 91 mg/dL (ref 70–99)
Potassium: 4.4 mEq/L (ref 3.5–5.1)
Sodium: 141 mEq/L (ref 135–145)
Total Bilirubin: 1.2 mg/dL (ref 0.2–1.2)
Total Protein: 6.7 g/dL (ref 6.0–8.3)

## 2020-07-01 LAB — CBC
HCT: 45.3 % (ref 39.0–52.0)
Hemoglobin: 15.6 g/dL (ref 13.0–17.0)
MCHC: 34.4 g/dL (ref 30.0–36.0)
MCV: 87.1 fl (ref 78.0–100.0)
Platelets: 196 10*3/uL (ref 150.0–400.0)
RBC: 5.2 Mil/uL (ref 4.22–5.81)
RDW: 13.3 % (ref 11.5–15.5)
WBC: 7.5 10*3/uL (ref 4.0–10.5)

## 2020-07-01 LAB — HEMOGLOBIN A1C: Hgb A1c MFr Bld: 5.5 % (ref 4.6–6.5)

## 2020-07-01 LAB — PSA, MEDICARE: PSA: 2.29 ng/ml (ref 0.10–4.00)

## 2020-07-01 LAB — TSH: TSH: 2.51 u[IU]/mL (ref 0.35–4.50)

## 2020-07-01 NOTE — Progress Notes (Signed)
I called to [facility] and updated the patient's chart with the vaccines.  Charlene Cowdrey,cma

## 2020-07-07 ENCOUNTER — Telehealth: Payer: Self-pay

## 2020-07-07 DIAGNOSIS — E785 Hyperlipidemia, unspecified: Secondary | ICD-10-CM

## 2020-07-07 MED ORDER — ROSUVASTATIN CALCIUM 20 MG PO TABS: 20.0000 mg | ORAL_TABLET | Freq: Every day | ORAL | 1 refills | Status: DC

## 2020-07-07 NOTE — Telephone Encounter (Signed)
-----   Message from Leone Haven, MD sent at 07/05/2020 10:23 AM EST ----- Please let the patient know that his LDL cholesterol is elevated. He is at increased risk for a stroke or heart attack based on his 10 year risk calculation and he would benefit from going on a statin to help lower this risk. I would like to place him on crestor. This can be sent in for crestor 20 mg tablets to take one tablet daily. 90 tablets with 1 refill. He would need a hepatic function panel and direct LDL for a diagnosis of hyperlipidemia in 6 weeks if he starts the crestor. His other labs are acceptable.   The 10-year ASCVD risk score Mikey Bussing DC Brooke Bonito., et al., 2013) is: 14%   Values used to calculate the score:     Age: 64 years     Sex: Male     Is Non-Hispanic African American: No     Diabetic: No     Tobacco smoker: No     Systolic Blood Pressure: 818 mmHg     Is BP treated: No     HDL Cholesterol: 33 mg/dL     Total Cholesterol: 199 mg/dL

## 2020-07-07 NOTE — Telephone Encounter (Signed)
Called the patient and gave lab results and he understood.  I ordered his future labs and his Crestor 20 mg was sent to pharmacy.  Brittnei Jagiello,cma

## 2020-07-14 NOTE — Addendum Note (Signed)
Addended by: Glori Luis on: 07/14/2020 09:50 AM   Modules accepted: Orders

## 2020-08-18 ENCOUNTER — Other Ambulatory Visit (INDEPENDENT_AMBULATORY_CARE_PROVIDER_SITE_OTHER): Payer: Medicare HMO

## 2020-08-18 ENCOUNTER — Other Ambulatory Visit: Payer: Self-pay

## 2020-08-18 DIAGNOSIS — E785 Hyperlipidemia, unspecified: Secondary | ICD-10-CM

## 2020-08-18 LAB — HEPATIC FUNCTION PANEL
ALT: 38 U/L (ref 0–53)
AST: 31 U/L (ref 0–37)
Albumin: 4.5 g/dL (ref 3.5–5.2)
Alkaline Phosphatase: 49 U/L (ref 39–117)
Bilirubin, Direct: 0.2 mg/dL (ref 0.0–0.3)
Total Bilirubin: 1 mg/dL (ref 0.2–1.2)
Total Protein: 6.3 g/dL (ref 6.0–8.3)

## 2020-08-18 LAB — LDL CHOLESTEROL, DIRECT: Direct LDL: 57 mg/dL

## 2020-08-22 DIAGNOSIS — H43812 Vitreous degeneration, left eye: Secondary | ICD-10-CM | POA: Diagnosis not present

## 2020-11-07 ENCOUNTER — Other Ambulatory Visit: Payer: Self-pay

## 2020-11-10 ENCOUNTER — Encounter: Payer: Self-pay | Admitting: Family Medicine

## 2020-11-10 ENCOUNTER — Ambulatory Visit (INDEPENDENT_AMBULATORY_CARE_PROVIDER_SITE_OTHER): Payer: Medicare HMO | Admitting: Family Medicine

## 2020-11-10 ENCOUNTER — Other Ambulatory Visit: Payer: Self-pay

## 2020-11-10 DIAGNOSIS — R112 Nausea with vomiting, unspecified: Secondary | ICD-10-CM

## 2020-11-10 DIAGNOSIS — R1110 Vomiting, unspecified: Secondary | ICD-10-CM | POA: Insufficient documentation

## 2020-11-10 LAB — COMPREHENSIVE METABOLIC PANEL
ALT: 15 U/L (ref 0–53)
AST: 16 U/L (ref 0–37)
Albumin: 3.6 g/dL (ref 3.5–5.2)
Alkaline Phosphatase: 55 U/L (ref 39–117)
BUN: 15 mg/dL (ref 6–23)
CO2: 34 mEq/L — ABNORMAL HIGH (ref 19–32)
Calcium: 8.9 mg/dL (ref 8.4–10.5)
Chloride: 103 mEq/L (ref 96–112)
Creatinine, Ser: 1.2 mg/dL (ref 0.40–1.50)
GFR: 63.32 mL/min (ref >60.00)
Glucose, Bld: 121 mg/dL — ABNORMAL HIGH (ref 70–99)
Potassium: 3.9 mEq/L (ref 3.5–5.1)
Sodium: 145 mEq/L (ref 135–145)
Total Bilirubin: 0.6 mg/dL (ref 0.2–1.2)
Total Protein: 6.1 g/dL (ref 6.0–8.3)

## 2020-11-10 LAB — CBC WITH DIFFERENTIAL/PLATELET
Basophils Absolute: 0 10*3/uL (ref 0.0–0.1)
Basophils Relative: 0.3 % (ref 0.0–3.0)
Eosinophils Absolute: 0.4 10*3/uL (ref 0.0–0.7)
Eosinophils Relative: 5.4 % — ABNORMAL HIGH (ref 0.0–5.0)
HCT: 39.8 % (ref 39.0–52.0)
Hemoglobin: 13.5 g/dL (ref 13.0–17.0)
Lymphocytes Relative: 20.3 % (ref 12.0–46.0)
Lymphs Abs: 1.5 10*3/uL (ref 0.7–4.0)
MCHC: 33.9 g/dL (ref 30.0–36.0)
MCV: 83.4 fl (ref 78.0–100.0)
Monocytes Absolute: 0.6 10*3/uL (ref 0.1–1.0)
Monocytes Relative: 7.4 % (ref 3.0–12.0)
Neutro Abs: 5 10*3/uL (ref 1.4–7.7)
Neutrophils Relative %: 66.6 % (ref 43.0–77.0)
Platelets: 300 10*3/uL (ref 150.0–400.0)
RBC: 4.77 Mil/uL (ref 4.22–5.81)
RDW: 12.6 % (ref 11.5–15.5)
WBC: 7.5 10*3/uL (ref 4.0–10.5)

## 2020-11-10 NOTE — Assessment & Plan Note (Signed)
The patient has had 3 episodes of vomiting and nausea followed by right mid abdominal discomfort and tenderness.  These are spaced out by about every 6 to 7 months.  He is recovering at this time.  It is certainly possible these all represent separate gastroenteritis episodes though could also represent some other underlying cause.  He does have some tenderness on exam today though no guarding or rebound.  We will check lab work to evaluate for underlying cause.  Discussed the potential for an ultrasound or CT scan depending on lab results.  Advised to seek medical attention for worsening abdominal pain, blood in his stool, blood in his vomit, or other worsening symptoms.

## 2020-11-10 NOTE — Patient Instructions (Signed)
Nice to see you. We will check lab work today and contact you with results. If you develop worsening symptoms or blood in your stool or blood in your vomit please seek medical attention.

## 2020-11-10 NOTE — Progress Notes (Signed)
[name], MD Phone: [phone]  Larry Richardson is a 65 y.o. male who presents today for same day visit.   Nausea/vomiting: The patient notes last week he had a day where he had a soft bowel movement then about 2 to 3 hours later he had several hours worth of vomiting and then dry heaves.  The vomiting was nonbloody nonbilious.  There was no diarrhea.  After vomiting for so long he developed right mid abdominal discomfort and soreness that has progressively been improving.  He has had no blood in his stool.  No dysphagia.  No fevers.  No sick contacts.  He does still have his gallbladder and appendix.  He had a colonoscopy in November that had sigmoid diverticulosis.  He notes 2 prior episodes of the same exact thing occurring 6 to 7 months ago and also a little over a year prior to now as well.  Social History   Tobacco Use  Smoking Status Never Smoker  Smokeless Tobacco Never Used    Current Outpatient Medications on File Prior to Visit  Medication Sig Dispense Refill  . cetirizine (ZYRTEC) 10 MG tablet Take 10 mg by mouth daily.    . fluticasone (FLONASE) 50 MCG/ACT nasal spray Place into both nostrils daily.    . naproxen (NAPROSYN) 500 MG tablet Take 500 mg by mouth 2 (two) times daily with a meal.    . rosuvastatin (CRESTOR) 20 MG tablet Take 1 tablet (20 mg total) by mouth daily. 90 tablet 1   No current facility-administered medications on file prior to visit.     ROS see history of present illness  Objective  Physical Exam Vitals:   11/10/20 1325 11/10/20 1335  BP: (!) 150/80 130/80  Pulse: (!) 102   Temp: 97.9 F (36.6 C)   SpO2: 97%     BP Readings from Last 3 Encounters:  11/10/20 130/80  06/30/20 118/70  08/09/16 116/84   Wt Readings from Last 3 Encounters:  11/10/20 195 lb 3.2 oz (88.5 kg)  06/30/20 208 lb 3.2 oz (94.4 kg)  08/09/16 204 lb 12.8 oz (92.9 kg)    Physical Exam Constitutional:      General: He is not in acute distress.     Appearance: He is not diaphoretic.  Cardiovascular:     Rate and Rhythm: Normal rate and regular rhythm.     Heart sounds: Normal heart sounds.  Pulmonary:     Effort: Pulmonary effort is normal.     Breath sounds: Normal breath sounds.  Abdominal:     General: Bowel sounds are normal. There is no distension.     Palpations: Abdomen is soft.     Tenderness: There is abdominal tenderness (Right mid abdominal tenderness, does not worsen with tensing of the abdominal muscles). There is no guarding or rebound.  Skin:    General: Skin is warm and dry.  Neurological:     Mental Status: He is alert.      Assessment/Plan: Please see individual problem list.  Problem List Items Addressed This Visit    Vomiting    The patient has had 3 episodes of vomiting and nausea followed by right mid abdominal discomfort and tenderness.  These are spaced out by about every 6 to 7 months.  He is recovering at this time.  It is certainly possible these all represent separate gastroenteritis episodes though could also represent some other underlying cause.  He does have some tenderness on exam today though no guarding or  rebound.  We will check lab work to evaluate for underlying cause.  Discussed the potential for an ultrasound or CT scan depending on lab results.  Advised to seek medical attention for worsening abdominal pain, blood in his stool, blood in his vomit, or other worsening symptoms.      Relevant Orders   Comp Met (CMET)   CBC w/Diff     This visit occurred during the SARS-CoV-2 public health emergency.  Safety protocols were in place, including screening questions prior to the visit, additional usage of staff PPE, and extensive cleaning of exam room while observing appropriate contact time as indicated for disinfecting solutions.    [name], MD [place]

## 2020-11-11 NOTE — Progress Notes (Signed)
Error. ng 

## 2020-11-17 ENCOUNTER — Other Ambulatory Visit: Payer: Self-pay | Admitting: Family Medicine

## 2020-11-17 DIAGNOSIS — R112 Nausea with vomiting, unspecified: Secondary | ICD-10-CM

## 2020-11-18 ENCOUNTER — Telehealth: Payer: Self-pay | Admitting: Family Medicine

## 2020-11-18 NOTE — Telephone Encounter (Signed)
Patient called about his referral for a MRI. Patient is going out of town next week.

## 2020-11-28 ENCOUNTER — Telehealth: Payer: Self-pay | Admitting: Family Medicine

## 2020-11-28 NOTE — Telephone Encounter (Signed)
lft vm for pt to call ofc to sch CT abdomen and pelvis. thanks

## 2020-11-28 NOTE — Telephone Encounter (Signed)
Pt returned your call.  

## 2020-12-10 DIAGNOSIS — C801 Malignant (primary) neoplasm, unspecified: Secondary | ICD-10-CM

## 2020-12-10 HISTORY — DX: Malignant (primary) neoplasm, unspecified: C80.1

## 2020-12-12 ENCOUNTER — Other Ambulatory Visit: Payer: Self-pay

## 2020-12-12 ENCOUNTER — Ambulatory Visit
Admission: RE | Admit: 2020-12-12 | Discharge: 2020-12-12 | Disposition: A | Discharge status: Home / Self Care | Payer: Medicare HMO | Source: Ambulatory Visit | Attending: Family Medicine | Admitting: Family Medicine

## 2020-12-12 DIAGNOSIS — R1110 Vomiting, unspecified: Secondary | ICD-10-CM | POA: Diagnosis not present

## 2020-12-12 DIAGNOSIS — N2889 Other specified disorders of kidney and ureter: Secondary | ICD-10-CM | POA: Diagnosis not present

## 2020-12-12 DIAGNOSIS — R112 Nausea with vomiting, unspecified: Secondary | ICD-10-CM | POA: Insufficient documentation

## 2020-12-12 DIAGNOSIS — R109 Unspecified abdominal pain: Secondary | ICD-10-CM | POA: Diagnosis not present

## 2020-12-12 LAB — POCT I-STAT CREATININE: Creatinine, Ser: 1.1 mg/dL (ref 0.61–1.24)

## 2020-12-12 MED ORDER — IOHEXOL 300 MG/ML  SOLN
100.0000 mL | Freq: Once | INTRAMUSCULAR | Status: AC | PRN
  Administered 2020-12-12: 100 mL via INTRAVENOUS

## 2020-12-13 ENCOUNTER — Other Ambulatory Visit: Payer: Self-pay | Admitting: Family Medicine

## 2020-12-13 DIAGNOSIS — N2889 Other specified disorders of kidney and ureter: Secondary | ICD-10-CM

## 2020-12-16 ENCOUNTER — Ambulatory Visit: Payer: Medicare HMO | Admitting: Urology

## 2020-12-16 ENCOUNTER — Encounter: Payer: Self-pay | Admitting: Urology

## 2020-12-16 ENCOUNTER — Other Ambulatory Visit: Payer: Self-pay

## 2020-12-16 VITALS — BP 152/77 | HR 82 | Ht 74.0 in | Wt 193.0 lb

## 2020-12-16 DIAGNOSIS — N2889 Other specified disorders of kidney and ureter: Secondary | ICD-10-CM | POA: Diagnosis not present

## 2020-12-16 LAB — URINALYSIS, COMPLETE
Bilirubin, UA: NEGATIVE
Glucose, UA: NEGATIVE
Ketones, UA: NEGATIVE
Leukocytes,UA: NEGATIVE
Nitrite, UA: NEGATIVE
Protein,UA: NEGATIVE
RBC, UA: NEGATIVE
Specific Gravity, UA: <1.005 — ABNORMAL LOW (ref 1.005–1.030)
Urobilinogen, Ur: 0.2 mg/dL (ref 0.2–1.0)
pH, UA: 6 (ref 5.0–7.5)

## 2020-12-16 LAB — MICROSCOPIC EXAMINATION
Bacteria, UA: NONE SEEN
Epithelial Cells (non renal): NONE SEEN /hpf (ref 0–10)
RBC, Urine: NONE SEEN /hpf (ref 0–2)

## 2020-12-16 NOTE — H&P (View-Only) (Signed)
12/16/2020 3:45 PM   Altha Harm 01/01/1956 [id number]  Referring provider: Leone Haven, MD [address] [place],  [place] [address]  Chief Complaint  Patient presents with  . Renal Mass    New Patient    HPI: 65 year old male who presents for evaluation of large right renal mass.  He initially presented to his primary care physician with several episodes of nausea vomiting and right abdominal/flank pain.  These been going on for up to a year and were somewhat cyclic in nature.  He ended up having abdominal CT abdomen pelvis with contrast completed on 12/12/2020 which shows a large 10.7 x 9.9 cm right posterior midpole renal mass which is enhancing with a necrotic center.  The tumor abuts the right psoas.  There is no evidence of lymphadenopathy or renal vein/IVC thrombus.  Left kidney is normal.  Baseline creatinine appears to be around 1.1.  CMP 11/10/2020 normal.  He is otherwise very healthy.  No previous abdominal surgeries.  No gross hematuria or weight loss.    PMH: Past Medical History:  Diagnosis Date  . Chickenpox   . Colon polyps   . Hemorrhoids    Had blood in his stool 12-13 years ago, had colonoscopy and was found to have hemorrhoids    Surgical History: No past surgical history on file.  Home Medications:  Allergies as of 12/16/2020   No Known Allergies     Medication List       Accurate as of December 16, 2020  3:45 PM. If you have any questions, ask your nurse or doctor.        STOP taking these medications   fluticasone 50 MCG/ACT nasal spray Commonly known as: FLONASE Stopped by: Hollice Espy, MD   naproxen 500 MG tablet Commonly known as: NAPROSYN Stopped by: Hollice Espy, MD     TAKE these medications   cetirizine 10 MG tablet Commonly known as: ZYRTEC Take 10 mg by mouth daily.   rosuvastatin 20 MG tablet Commonly known as: Crestor Take 1 tablet (20 mg total) by mouth daily.       Allergies: No Known  Allergies  Family History: Family History  Problem Relation Age of Onset  . Breast cancer Unknown        Mom  . Prostate cancer Unknown        Grandfather  . Hyperlipidemia Unknown        Parent  . Hypertension Unknown        Parent  . Leukemia Unknown        Father    Social History:  reports that he has never smoked. He has never used smokeless tobacco. He reports current alcohol use of about 2.0 standard drinks of alcohol per week. He reports that he does not use drugs.   Physical Exam: BP (!) 152/77   Pulse 82   Ht 6\' 2"  (1.88 m)   Wt 193 lb (87.5 kg)   BMI 24.78 kg/m   Constitutional:  Alert and oriented, No acute distress.  Accompanied by his wife today. HEENT: [place] AT, moist mucus membranes.  Trachea midline, no masses. Cardiovascular: No clubbing, cyanosis, or edema. Respiratory: Normal respiratory effort, no increased work of breathing. GI: Abdomen is soft, nontender, nondistended, no abdominal masses GU: Right renal mass is palpable Skin: No rashes, bruises or suspicious lesions. Neurologic: Grossly intact, no focal deficits, moving all 4 extremities. Psychiatric: Normal mood and affect.  Laboratory Data: Lab Results  Component  Value Date   WBC 7.5 11/10/2020   HGB 13.5 11/10/2020   HCT 39.8 11/10/2020   MCV 83.4 11/10/2020   PLT 300.0 11/10/2020    Lab Results  Component Value Date   CREATININE 1.10 12/12/2020    Lab Results  Component Value Date   HGBA1C 5.5 06/30/2020    Pertinent Imaging: CLINICAL DATA:  Right-sided abdominal pain for several months. Nausea and vomiting.  EXAM: CT ABDOMEN AND PELVIS WITH CONTRAST  TECHNIQUE: Multidetector CT imaging of the abdomen and pelvis was performed using the standard protocol following bolus administration of intravenous contrast.  CONTRAST:  16mL OMNIPAQUE IOHEXOL 300 MG/ML  SOLN  COMPARISON:  None.  FINDINGS: Lower Chest: No acute findings.  Hepatobiliary: No hepatic masses  identified. A few tiny sub-cm cysts are noted. Gallbladder is unremarkable. No evidence of biliary ductal dilatation.  Pancreas:  No mass or inflammatory changes.  Spleen: Within normal limits in size and appearance.  Adrenals/Urinary Tract: A large heterogeneously enhancing soft tissue mass is seen arising from the posterior midpole of the right kidney which measures 10.7 x 9.9 cm. This abuts the right psoas muscle, indicating invasion through Gerota's fascia. The left kidney and adrenal glands are normal in appearance. No evidence ureteral calculi or hydronephrosis.  Stomach/Bowel: No evidence of obstruction, inflammatory process or abnormal fluid collections.  Vascular/Lymphatic: No pathologically enlarged lymph nodes. No acute vascular findings. No evidence of renal vein or IVC thrombus.  Reproductive:  No mass or other significant abnormality.  Other:  None.  Musculoskeletal:  No suspicious bone lesions identified.  IMPRESSION: 11 cm right renal mass, consistent with renal cell carcinoma, with probable extension through Gerota fascia adjacent to the right psoas muscle.  No evidence of metastatic disease.  These results will be called to the ordering clinician or representative by the Radiologist Assistant, and communication documented in the PACS or [employer].   Electronically Signed   By: Marlaine Hind M.D.   On: 12/12/2020 22:09  CT abdomen/pelvis with contrast personally reviewed.  Agree with radiologic interpretation.  There is not extensive fat around the kidney and this is unclear whether or not this is direct tumor extension to the psoas versus just absence of fat plane around the kidney  Assessment & Plan:    1.  Right renal mass Large 10.7 cm right renal mass without evidence of hilar invasion or lymphadenopathy  There is questionable involvement of the psoas however he is very little Gerota's fat and thus it is unclear whether or  not the tumor locally invasive psoas based on the study alone, discussed my intraoperative experience with local invasion  I recommended right radical nephrectomy.  We will approach this via laparoscopic hand-assisted with low threshold to convert to an open procedure especially if there is evidence of psoas invasion.  We discussed the procedure today at length.  We discussed the risks of significant/serious life threatening bleeding, risk of infection, damage surrounding structures, hernia formation, ileus and other bowel related complications, progression of end-stage renal disease and risk of recurrence amongst others.  We also discussed more generalized risk of complications such as heart attack, stroke, blood clot, death, etc.  He understands all this and is willing to proceed.  We also had a lengthy discussion today about postoperative pathology dictating need for surveillance imaging interval postoperatively versus additional adjuvant treatment.  We discussed the postoperative course including lifting restrictions for 4 to 6 weeks.  All questions were answered.  We will plan to complete  the staging with a noncontrast chest CT (contrast shortage).  He is agreeable this plan.  All questions were answered today.    Imaging shared with patient today.  Care coordinated with Dr. Diamantina Providence who has aggreed to assist.  Surgery schedule for June 24th.    - Urinalysis, Complete   Return for surgery.  Hollice Espy, MD  [facility] [address] [place], [place] [address] [phone]   I spent 60 total minutes on the day of the encounter including pre-visit review of the medical record, face-to-face time with the patient, and post visit ordering of labs/imaging/tests.

## 2020-12-16 NOTE — Patient Instructions (Signed)
Minimally Invasive Nephrectomy Minimally invasive nephrectomy is a surgical procedure to remove a kidney. This procedure is done through several small incisions in the abdomen. You may have surgery to:  Remove the entire kidney and some surrounding structures (radical nephrectomy).  Remove only the damaged or diseased part of the kidney (partial nephrectomy). You may need this surgery if:  Your kidney is severely damaged from disease, infection, or cancer.  You were born with an abnormal kidney.  You are donating a healthy kidney. Tell a health care provider about:  Any allergies you have.  All medicines you are taking, including vitamins, herbs, eye drops, creams, and over-the-counter medicines.  Any problems you or family members have had with anesthetic medicines.  Any blood disorders you have.  Any surgeries you have had.  Any medical conditions you have.  Whether you are pregnant or may be pregnant. What are the risks? Generally, this is a safe procedure. However, problems may occur, including:  Bleeding.  Infection.  Damage to other body structures near the kidney.  Urine leaking into the abdomen.  Pneumonia.  A blood clot that forms in the leg and travels to the lung (pulmonary embolism).  Allergic reactions to medicines. A bone (usually part of a rib) or more tissue may need to be removed so the surgeon can get to the kidney. If this happens, the minimally invasive procedure may need to be changed to an open procedure. What happens before the procedure? Staying hydrated Follow instructions from your health care provider about hydration, which may include:  Up to 2 hours before the procedure - you may continue to drink clear liquids, such as water, clear fruit juice, black coffee, and plain tea.   Eating and drinking restrictions Follow instructions from your health care provider about eating and drinking, which may include:  8 hours before the procedure -  stop eating heavy meals or foods, such as meat, fried foods, or fatty foods.  6 hours before the procedure - stop eating light meals or foods, such as toast or cereal.  6 hours before the procedure - stop drinking milk or drinks that contain milk.  2 hours before the procedure - stop drinking clear liquids. Medicines Ask your health care provider about:  Changing or stopping your regular medicines. This is especially important if you are taking diabetes medicines or blood thinners.  Taking medicines such as aspirin and ibuprofen. These medicines can thin your blood. Do not take these medicines unless your health care provider tells you to take them.  Taking over-the-counter medicines, vitamins, herbs, and supplements. Surgery safety Ask your health care provider:  How your surgery site will be marked.  What steps will be taken to help prevent infection. These steps may include: ? Removing hair at the surgery site. ? Washing skin with a germ-killing soap. ? Taking antibiotic medicine. General instructions  Do not use any products that contain nicotine or tobacco for at least 4 weeks before the procedure. These products include cigarettes, e-cigarettes, and chewing tobacco. If you need help quitting, ask your health care provider.  Your health care provider may instruct you to do breathing exercises before the procedure to help prevent pneumonia. Do these exercises as directed.  You may need to have your blood drawn (type and cross) in case you need to receive blood (get a transfusion) during the procedure.  Plan to have a responsible adult take you home from the hospital or clinic.  Plan to have a responsible adult  care for you for the time you are told after you leave the hospital or clinic. This is important. What happens during the procedure? Before surgery begins  An IV will be inserted into one of your veins.  You will be given a medicine to make you fall asleep (general  anesthetic).  Your health care provider may take steps to help blood flow in your legs. You may have: ? Tight stockings, also called compression stockings, on your legs. ? Compression sleeves wrapped around your legs. A machine called a sequential compression device (SCD) will pump air into the sleeves.  A small, thin tube will be placed in your bladder (urinary catheter) to drain urine.  A tube will be placed through your nose and into your stomach (nasogastric tube) to drain stomach fluids. During surgery  A small incision will be made in your abdomen to insert a thin, lighted tube (laparoscope) with a camera attached. This lets your surgeon see your kidney during the procedure.  More small incisions will be made to insert other surgical tools. One incision may be slightly larger to remove your kidney. ? In some cases, the surgeon will do the procedure by controlling tools that are attached to a surgical robot. This is called a robot-assisted nephrectomy.  The next steps depend on the type of procedure you are having. ? If you are having a partial nephrectomy:  The blood vessels attached to your kidney will be clamped.  Part of your kidney will be removed. The kidney will be closed with stitches (sutures), and the clamp on the blood vessels will be removed. ? If you are having a radical nephrectomy:  All of the blood vessels that attach to your kidney will be closed and cut.  Part of the tube that carries urine from your kidney to your bladder (ureter) will be removed.  Your kidney will be removed.  All of the surgical tools will be removed from your body.  A small tube (drain) may be placed near one of the incisions to drain extra fluid from the surgery area.  The incisions may be closed with sutures or another type of closure.  A bandage (dressing) will be placed over the incision area. The procedure may vary among health care providers and hospitals.      What happens  after the procedure?  Your blood pressure, heart rate, breathing rate, and blood oxygen level will be monitored until you leave the hospital or clinic.  Your nasogastric tube will be removed.  You may continue to have: ? An IV until you can drink fluids on your own. ? A urinary catheter. Your urine output will be checked. ? A drain. Your surgical drain will be removed after a few days. Your health care provider will tell you how to care for the drain at home. ? Pain medicine. This will be given through your IV. ? Compression stockings or compression sleeves.This helps to prevent blood clots and reduce swelling in your legs.  You will be encouraged to: ? Get out of bed and walk as soon as possible. ? Do breathing exercises, such as coughing and breathing deeply. This helps prevent pneumonia. Summary  Minimally invasive nephrectomy is a surgical procedure to remove a kidney. In some cases, only the damaged or diseased part of the kidney is removed.  This procedure is done through several small incisions in the abdomen.  Follow instructions from your health care provider about taking medicines and about eating and drinking before  the procedure.  You will be given a medicine to make you fall asleep (general anesthetic) during the procedure. This information is not intended to replace advice given to you by your health care provider. Make sure you discuss any questions you have with your health care provider. Document Revised: 10/22/2019 Document Reviewed: 10/22/2019 Elsevier Patient Education  2021 [employer].

## 2020-12-16 NOTE — Progress Notes (Signed)
12/16/2020 3:45 PM   Larry Richardson January 24, 1956 [id number]  Referring provider: Leone Haven, MD [address] [place],  [place] [address]  Chief Complaint  Patient presents with  . Renal Mass    New Patient    HPI: 65 year old male who presents for evaluation of large right renal mass.  He initially presented to his primary care physician with several episodes of nausea vomiting and right abdominal/flank pain.  These been going on for up to a year and were somewhat cyclic in nature.  He ended up having abdominal CT abdomen pelvis with contrast completed on 12/12/2020 which shows a large 10.7 x 9.9 cm right posterior midpole renal mass which is enhancing with a necrotic center.  The tumor abuts the right psoas.  There is no evidence of lymphadenopathy or renal vein/IVC thrombus.  Left kidney is normal.  Baseline creatinine appears to be around 1.1.  CMP 11/10/2020 normal.  He is otherwise very healthy.  No previous abdominal surgeries.  No gross hematuria or weight loss.    PMH: Past Medical History:  Diagnosis Date  . Chickenpox   . Colon polyps   . Hemorrhoids    Had blood in his stool 12-13 years ago, had colonoscopy and was found to have hemorrhoids    Surgical History: No past surgical history on file.  Home Medications:  Allergies as of 12/16/2020   No Known Allergies     Medication List       Accurate as of December 16, 2020  3:45 PM. If you have any questions, ask your nurse or doctor.        STOP taking these medications   fluticasone 50 MCG/ACT nasal spray Commonly known as: FLONASE Stopped by: Hollice Espy, MD   naproxen 500 MG tablet Commonly known as: NAPROSYN Stopped by: Hollice Espy, MD     TAKE these medications   cetirizine 10 MG tablet Commonly known as: ZYRTEC Take 10 mg by mouth daily.   rosuvastatin 20 MG tablet Commonly known as: Crestor Take 1 tablet (20 mg total) by mouth daily.       Allergies: No Known  Allergies  Family History: Family History  Problem Relation Age of Onset  . Breast cancer Unknown        Mom  . Prostate cancer Unknown        Grandfather  . Hyperlipidemia Unknown        Parent  . Hypertension Unknown        Parent  . Leukemia Unknown        Father    Social History:  reports that he has never smoked. He has never used smokeless tobacco. He reports current alcohol use of about 2.0 standard drinks of alcohol per week. He reports that he does not use drugs.   Physical Exam: BP (!) 152/77   Pulse 82   Ht 6\' 2"  (1.88 m)   Wt 193 lb (87.5 kg)   BMI 24.78 kg/m   Constitutional:  Alert and oriented, No acute distress.  Accompanied by his wife today. HEENT: [place] AT, moist mucus membranes.  Trachea midline, no masses. Cardiovascular: No clubbing, cyanosis, or edema. Respiratory: Normal respiratory effort, no increased work of breathing. GI: Abdomen is soft, nontender, nondistended, no abdominal masses GU: Right renal mass is palpable Skin: No rashes, bruises or suspicious lesions. Neurologic: Grossly intact, no focal deficits, moving all 4 extremities. Psychiatric: Normal mood and affect.  Laboratory Data: Lab Results  Component  Value Date   WBC 7.5 11/10/2020   HGB 13.5 11/10/2020   HCT 39.8 11/10/2020   MCV 83.4 11/10/2020   PLT 300.0 11/10/2020    Lab Results  Component Value Date   CREATININE 1.10 12/12/2020    Lab Results  Component Value Date   HGBA1C 5.5 06/30/2020    Pertinent Imaging: CLINICAL DATA:  Right-sided abdominal pain for several months. Nausea and vomiting.  EXAM: CT ABDOMEN AND PELVIS WITH CONTRAST  TECHNIQUE: Multidetector CT imaging of the abdomen and pelvis was performed using the standard protocol following bolus administration of intravenous contrast.  CONTRAST:  135mL OMNIPAQUE IOHEXOL 300 MG/ML  SOLN  COMPARISON:  None.  FINDINGS: Lower Chest: No acute findings.  Hepatobiliary: No hepatic masses  identified. A few tiny sub-cm cysts are noted. Gallbladder is unremarkable. No evidence of biliary ductal dilatation.  Pancreas:  No mass or inflammatory changes.  Spleen: Within normal limits in size and appearance.  Adrenals/Urinary Tract: A large heterogeneously enhancing soft tissue mass is seen arising from the posterior midpole of the right kidney which measures 10.7 x 9.9 cm. This abuts the right psoas muscle, indicating invasion through Gerota's fascia. The left kidney and adrenal glands are normal in appearance. No evidence ureteral calculi or hydronephrosis.  Stomach/Bowel: No evidence of obstruction, inflammatory process or abnormal fluid collections.  Vascular/Lymphatic: No pathologically enlarged lymph nodes. No acute vascular findings. No evidence of renal vein or IVC thrombus.  Reproductive:  No mass or other significant abnormality.  Other:  None.  Musculoskeletal:  No suspicious bone lesions identified.  IMPRESSION: 11 cm right renal mass, consistent with renal cell carcinoma, with probable extension through Gerota fascia adjacent to the right psoas muscle.  No evidence of metastatic disease.  These results will be called to the ordering clinician or representative by the Radiologist Assistant, and communication documented in the PACS or [employer].   Electronically Signed   By: Marlaine Hind M.D.   On: 12/12/2020 22:09  CT abdomen/pelvis with contrast personally reviewed.  Agree with radiologic interpretation.  There is not extensive fat around the kidney and this is unclear whether or not this is direct tumor extension to the psoas versus just absence of fat plane around the kidney  Assessment & Plan:    1.  Right renal mass Large 10.7 cm right renal mass without evidence of hilar invasion or lymphadenopathy  There is questionable involvement of the psoas however he is very little Gerota's fat and thus it is unclear whether or  not the tumor locally invasive psoas based on the study alone, discussed my intraoperative experience with local invasion  I recommended right radical nephrectomy.  We will approach this via laparoscopic hand-assisted with low threshold to convert to an open procedure especially if there is evidence of psoas invasion.  We discussed the procedure today at length.  We discussed the risks of significant/serious life threatening bleeding, risk of infection, damage surrounding structures, hernia formation, ileus and other bowel related complications, progression of end-stage renal disease and risk of recurrence amongst others.  We also discussed more generalized risk of complications such as heart attack, stroke, blood clot, death, etc.  He understands all this and is willing to proceed.  We also had a lengthy discussion today about postoperative pathology dictating need for surveillance imaging interval postoperatively versus additional adjuvant treatment.  We discussed the postoperative course including lifting restrictions for 4 to 6 weeks.  All questions were answered.  We will plan to complete  the staging with a noncontrast chest CT (contrast shortage).  He is agreeable this plan.  All questions were answered today.    Imaging shared with patient today.  Care coordinated with Dr. Diamantina Providence who has aggreed to assist.  Surgery schedule for June 24th.    - Urinalysis, Complete   Return for surgery.  Hollice Espy, MD  [facility] [address] [place], [place] [address] [phone]   I spent 60 total minutes on the day of the encounter including pre-visit review of the medical record, face-to-face time with the patient, and post visit ordering of labs/imaging/tests.

## 2020-12-18 ENCOUNTER — Encounter: Payer: Self-pay | Admitting: Urology

## 2020-12-18 LAB — CULTURE, URINE COMPREHENSIVE

## 2020-12-19 ENCOUNTER — Ambulatory Visit: Payer: Self-pay | Admitting: Urology

## 2020-12-19 ENCOUNTER — Other Ambulatory Visit: Payer: Self-pay | Admitting: Urology

## 2020-12-19 DIAGNOSIS — N2889 Other specified disorders of kidney and ureter: Secondary | ICD-10-CM

## 2020-12-21 ENCOUNTER — Other Ambulatory Visit: Payer: Self-pay | Admitting: Family Medicine

## 2020-12-21 DIAGNOSIS — E785 Hyperlipidemia, unspecified: Secondary | ICD-10-CM

## 2020-12-24 ENCOUNTER — Other Ambulatory Visit: Payer: Self-pay

## 2020-12-24 ENCOUNTER — Ambulatory Visit
Admission: RE | Admit: 2020-12-24 | Discharge: 2020-12-24 | Disposition: A | Discharge status: Home / Self Care | Payer: Medicare HMO | Source: Ambulatory Visit | Attending: Urology | Admitting: Urology

## 2020-12-24 DIAGNOSIS — R911 Solitary pulmonary nodule: Secondary | ICD-10-CM | POA: Diagnosis not present

## 2020-12-24 DIAGNOSIS — N2889 Other specified disorders of kidney and ureter: Secondary | ICD-10-CM | POA: Insufficient documentation

## 2020-12-25 ENCOUNTER — Telehealth: Payer: Self-pay | Admitting: *Deleted

## 2020-12-25 NOTE — Telephone Encounter (Addendum)
Patient advised, voiced understanding.  ----- Message from Hollice Espy, MD sent at 12/24/2020  2:19 PM EDT ----- The chest CT looks fine, no obvious metastatic disease.  Great news.  Hollice Espy, MD

## 2020-12-25 NOTE — Telephone Encounter (Addendum)
Patient notified, voiced understanding.   ----- Message from Hollice Espy, MD sent at 12/24/2020  2:19 PM EDT ----- The chest CT looks fine, no obvious metastatic disease.  Great news.  Hollice Espy, MD

## 2020-12-26 ENCOUNTER — Other Ambulatory Visit: Payer: Self-pay

## 2020-12-26 ENCOUNTER — Encounter
Admission: RE | Admit: 2020-12-26 | Discharge: 2020-12-26 | Disposition: A | Discharge status: Home / Self Care | Payer: Medicare HMO | Source: Ambulatory Visit | Attending: Urology | Admitting: Urology

## 2020-12-26 HISTORY — DX: Hyperlipidemia, unspecified: E78.5

## 2020-12-26 HISTORY — DX: Diverticulosis of intestine, part unspecified, without perforation or abscess without bleeding: K57.90

## 2020-12-26 NOTE — Patient Instructions (Addendum)
INSTRUCTIONS FOR SURGERY     Your surgery is scheduled for: Friday, June 24TH       To find out your arrival time for the day of surgery,          please call [phone] between 1 pm and 3 pm on :  Thursday, June 23RD     When you arrive for surgery, report to the [facility]. ONCE THEY HAVE COMPLETED THEIR PROCESS, PROCEED TO THE  SECOND FLOOR AND CHECK IN AT THE SURGICAL DESK.    REMEMBER: Instructions that are not followed completely may result in serious medical risk,  up to and including death, or upon the discretion of your surgeon and anesthesiologist,            your surgery may need to be rescheduled.  __X__ 1. Do not eat food after midnight the night before your procedure.                    No gum, candy, lozenger, tic tacs, tums or hard candies.                  ABSOLUTELY NOTHING SOLID IN YOUR MOUTH AFTER MIDNIGHT                    You may drink unlimited clear liquids up to 2 hours before you are scheduled to arrive for surgery.                   Do not drink anything within those 2 hours unless you need to take medicine, then take the                   smallest amount you need.  Clear liquids include:  water, apple juice without pulp,                   any flavor Gatorade, Black coffee, black tea.  Sugar may be added but no dairy/ honey /lemon.                        Broth and jello is not considered a clear liquid.  __x__  2. On the morning of surgery, please brush your teeth with toothpaste and water. You may rinse with                  mouthwash if you wish but DO NOT SWALLOW TOOTHPASTE OR MOUTHWASH  __X___3. NO alcohol for 24 hours before or after surgery.  __x___ 4.  Do NOT smoke or use e-cigarettes for 24 HOURS PRIOR TO SURGERY.                      DO NOT  Use any chewable tobacco products for at least 6 hours prior to surgery.  __x___ 5. If you start any new  medication after this appointment and prior to surgery, please                   Bring it with you on the day of surgery.  ___x__ 6.  Notify your doctor if there is any change in your medical condition, such as fever,                   infection, vomitting, diarrhea or any open sores.  __x___ 7.  USE the CHG SOAP as instructed, the night before surgery and the day of surgery.                   Once you have washed with this soap, do NOT use any of the following: Powders, perfumes                    or lotions. Please do not wear make up, hairpins, clips or nail polish. You may wear deodorant.                                                              Men may shave their face and neck.                     DO NOT wear ANY jewelry on the day of surgery. If there are rings that are too tight to                    remove easily, please address this prior to the surgery day. Piercings need to be removed.                                                                     NO METAL ON YOUR BODY.                    Do NOT bring any valuables.  If you came to Pre-Admit testing then you will not need license,                     insurance card or credit card.  If you will be staying overnight, please either leave your things in                     the car or have your family be responsible for these items.                     [facility] IS NOT RESPONSIBLE FOR BELONGINGS OR VALUABLES.  ___X__ 8. DO NOT wear contact lenses on surgery day.  You may not have dentures,                     Hearing aides, contacts or glasses in the operating room. These items can be                    Placed in the Recovery Room to receive immediately after surgery.  ___x__ 10. Take the following medications on the morning of surgery with a sip of water:  1. CRESTOR                     2.                     3.  _____ 11.  Follow any instructions provided to you by your surgeon.                         Such as enema, clear liquid bowel prep  __X__  12. STOP ALL ASPIRIN PRODUCTS ONE WEEK PRIOR TO SURGERY (12/26/20)                       THIS INCLUDES BC POWDERS / GOODIES POWDER  __x___ 13. STOP Anti-inflammatories as of ONE WEEK PRIOR TO SURGERY (12/26/20)                      This includes IBUPROFEN / MOTRIN / ADVIL / [place] TO SURGERY.  __X___ 14.  Stop supplements until after surgery.                ___X___18. If staying overnight, please have appropriate shoes to wear to be able to walk around the unit.                   Wear clean and comfortable clothing to the hospital.  REMEMBER TO [facility] PHONE AND CHARGER. HAVE PHONE NUMBERS FOR YOUR CONTACTS.  GOOD LUCK!!

## 2020-12-27 ENCOUNTER — Encounter: Payer: Self-pay | Admitting: Family Medicine

## 2020-12-27 DIAGNOSIS — E785 Hyperlipidemia, unspecified: Secondary | ICD-10-CM

## 2020-12-28 ENCOUNTER — Encounter: Payer: Self-pay | Admitting: Urology

## 2020-12-31 ENCOUNTER — Other Ambulatory Visit: Payer: Self-pay

## 2020-12-31 ENCOUNTER — Encounter
Admission: RE | Admit: 2020-12-31 | Discharge: 2020-12-31 | Disposition: A | Discharge status: Home / Self Care | Payer: Medicare HMO | Source: Ambulatory Visit | Attending: Urology | Admitting: Urology

## 2020-12-31 DIAGNOSIS — Z20822 Contact with and (suspected) exposure to covid-19: Secondary | ICD-10-CM | POA: Insufficient documentation

## 2020-12-31 DIAGNOSIS — Z01818 Encounter for other preprocedural examination: Secondary | ICD-10-CM | POA: Insufficient documentation

## 2020-12-31 DIAGNOSIS — Z01810 Encounter for preprocedural cardiovascular examination: Secondary | ICD-10-CM | POA: Diagnosis not present

## 2020-12-31 LAB — BASIC METABOLIC PANEL
Anion gap: 5 (ref 5–15)
BUN: 10 mg/dL (ref 8–23)
CO2: 29 mmol/L (ref 22–32)
Calcium: 8.8 mg/dL — ABNORMAL LOW (ref 8.9–10.3)
Chloride: 106 mmol/L (ref 98–111)
Creatinine, Ser: 1.09 mg/dL (ref 0.61–1.24)
GFR, Estimated: >60 mL/min (ref >60)
Glucose, Bld: 108 mg/dL — ABNORMAL HIGH (ref 70–99)
Potassium: 4.2 mmol/L (ref 3.5–5.1)
Sodium: 140 mmol/L (ref 135–145)

## 2020-12-31 LAB — TYPE AND SCREEN
ABO/RH(D): A POS
Antibody Screen: NEGATIVE

## 2020-12-31 LAB — PROTIME-INR
INR: 1.2 (ref 0.8–1.2)
Prothrombin Time: 15.3 seconds — ABNORMAL HIGH (ref 11.4–15.2)

## 2020-12-31 LAB — SARS CORONAVIRUS 2 (TAT 6-24 HRS): SARS Coronavirus 2: NEGATIVE

## 2020-12-31 LAB — APTT: aPTT: 36 seconds (ref 24–36)

## 2021-01-01 MED ORDER — ORAL CARE MOUTH RINSE: 15.0000 mL | Freq: Once | OROMUCOSAL | Status: AC

## 2021-01-01 MED ORDER — CHLORHEXIDINE GLUCONATE 0.12 % MT SOLN: 15.0000 mL | Freq: Once | OROMUCOSAL | Status: AC

## 2021-01-01 MED ORDER — FAMOTIDINE 20 MG PO TABS: 20.0000 mg | ORAL_TABLET | Freq: Once | ORAL | Status: AC

## 2021-01-01 MED ORDER — CEFAZOLIN SODIUM-DEXTROSE 2-4 GM/100ML-% IV SOLN
2.0000 g | INTRAVENOUS | Status: AC
Start: 2021-01-01 — End: 2021-01-02
  Administered 2021-01-02: 2 g via INTRAVENOUS

## 2021-01-01 MED ORDER — LACTATED RINGERS IV SOLN: INTRAVENOUS | Status: DC

## 2021-01-02 ENCOUNTER — Encounter
Admission: RE | Disposition: A | Discharge status: Home / Self Care | Payer: Self-pay | Source: Home / Self Care | Attending: Urology

## 2021-01-02 ENCOUNTER — Inpatient Hospital Stay: Payer: Medicare HMO | Admitting: Anesthesiology

## 2021-01-02 ENCOUNTER — Inpatient Hospital Stay
Admission: RE | Admit: 2021-01-02 | Discharge: 2021-01-03 | DRG: 658 | Disposition: A | Discharge status: Home / Self Care | Payer: Medicare HMO | Attending: Urology | Admitting: Urology

## 2021-01-02 ENCOUNTER — Other Ambulatory Visit: Payer: Self-pay

## 2021-01-02 ENCOUNTER — Encounter: Payer: Self-pay | Admitting: Urology

## 2021-01-02 DIAGNOSIS — Z20822 Contact with and (suspected) exposure to covid-19: Secondary | ICD-10-CM | POA: Diagnosis present

## 2021-01-02 DIAGNOSIS — N2889 Other specified disorders of kidney and ureter: Secondary | ICD-10-CM | POA: Diagnosis not present

## 2021-01-02 DIAGNOSIS — D3001 Benign neoplasm of right kidney: Secondary | ICD-10-CM | POA: Diagnosis present

## 2021-01-02 DIAGNOSIS — Z7689 Persons encountering health services in other specified circumstances: Secondary | ICD-10-CM | POA: Diagnosis not present

## 2021-01-02 DIAGNOSIS — N289 Disorder of kidney and ureter, unspecified: Secondary | ICD-10-CM | POA: Diagnosis not present

## 2021-01-02 DIAGNOSIS — C641 Malignant neoplasm of right kidney, except renal pelvis: Principal | ICD-10-CM | POA: Diagnosis present

## 2021-01-02 HISTORY — PX: LAPAROSCOPIC NEPHRECTOMY, HAND ASSISTED: SHX1929

## 2021-01-02 LAB — ABO/RH: ABO/RH(D): A POS

## 2021-01-02 SURGERY — NEPHRECTOMY, HAND-ASSISTED, LAPAROSCOPIC
Anesthesia: General | Laterality: Right

## 2021-01-02 MED ORDER — SODIUM CHLORIDE 0.9 % IV SOLN: INTRAVENOUS | Status: DC

## 2021-01-02 MED ORDER — BELLADONNA ALKALOIDS-OPIUM 16.2-60 MG RE SUPP: 1.0000 | Freq: Four times a day (QID) | RECTAL | Status: DC | PRN

## 2021-01-02 MED ORDER — DOCUSATE SODIUM 100 MG PO CAPS: 100.0000 mg | ORAL_CAPSULE | Freq: Two times a day (BID) | ORAL | 0 refills | Status: AC

## 2021-01-02 MED ORDER — THROMBIN 5000 UNITS EX SOLR
CUTANEOUS | Status: AC
  Filled 2021-01-02: qty 5000

## 2021-01-02 MED ORDER — BUPIVACAINE LIPOSOME 1.3 % IJ SUSP
INTRAMUSCULAR | Status: AC
  Filled 2021-01-02: qty 20

## 2021-01-02 MED ORDER — ROCURONIUM BROMIDE 100 MG/10ML IV SOLN
INTRAVENOUS | Status: DC | PRN
  Administered 2021-01-02: 10 mg via INTRAVENOUS
  Administered 2021-01-02 (×2): 20 mg via INTRAVENOUS
  Administered 2021-01-02: 50 mg via INTRAVENOUS

## 2021-01-02 MED ORDER — FAMOTIDINE 20 MG PO TABS
ORAL_TABLET | ORAL | Status: AC
  Administered 2021-01-02: 20 mg via ORAL
  Filled 2021-01-02: qty 1

## 2021-01-02 MED ORDER — SODIUM CHLORIDE 0.9 % IV SOLN
INTRAVENOUS | Status: DC | PRN
  Administered 2021-01-02: 25 ug/min via INTRAVENOUS

## 2021-01-02 MED ORDER — PROPOFOL 10 MG/ML IV BOLUS
INTRAVENOUS | Status: DC | PRN
  Administered 2021-01-02: 200 mg via INTRAVENOUS

## 2021-01-02 MED ORDER — CHLORHEXIDINE GLUCONATE CLOTH 2 % EX PADS
6.0000 | MEDICATED_PAD | Freq: Every day | CUTANEOUS | Status: DC
  Administered 2021-01-03: 6 via TOPICAL

## 2021-01-02 MED ORDER — MIDAZOLAM HCL 2 MG/2ML IJ SOLN
INTRAMUSCULAR | Status: AC
  Filled 2021-01-02: qty 2

## 2021-01-02 MED ORDER — BUPIVACAINE HCL 0.5 % IJ SOLN
INTRAMUSCULAR | Status: DC | PRN
  Administered 2021-01-02: 20 mL

## 2021-01-02 MED ORDER — DOCUSATE SODIUM 100 MG PO CAPS
100.0000 mg | ORAL_CAPSULE | Freq: Two times a day (BID) | ORAL | Status: DC
  Administered 2021-01-02 – 2021-01-03 (×2): 100 mg via ORAL
  Filled 2021-01-02 (×2): qty 1

## 2021-01-02 MED ORDER — ACETAMINOPHEN 10 MG/ML IV SOLN
INTRAVENOUS | Status: DC | PRN
  Administered 2021-01-02: 1000 mg via INTRAVENOUS

## 2021-01-02 MED ORDER — ROSUVASTATIN CALCIUM 10 MG PO TABS
20.0000 mg | ORAL_TABLET | Freq: Every day | ORAL | Status: DC
  Administered 2021-01-03: 20 mg via ORAL
  Filled 2021-01-02: qty 2

## 2021-01-02 MED ORDER — CHLORHEXIDINE GLUCONATE 0.12 % MT SOLN
OROMUCOSAL | Status: AC
  Administered 2021-01-02: 15 mL via OROMUCOSAL
  Filled 2021-01-02: qty 15

## 2021-01-02 MED ORDER — DIPHENHYDRAMINE HCL 12.5 MG/5ML PO ELIX
12.5000 mg | ORAL_SOLUTION | Freq: Four times a day (QID) | ORAL | Status: DC | PRN
  Filled 2021-01-02: qty 5

## 2021-01-02 MED ORDER — FENTANYL CITRATE (PF) 100 MCG/2ML IJ SOLN
25.0000 ug | INTRAMUSCULAR | Status: DC | PRN
  Administered 2021-01-02 (×5): 25 ug via INTRAVENOUS

## 2021-01-02 MED ORDER — BUPIVACAINE HCL (PF) 0.5 % IJ SOLN
INTRAMUSCULAR | Status: AC
  Filled 2021-01-02: qty 30

## 2021-01-02 MED ORDER — THROMBIN 5000 UNITS EX SOLR
CUTANEOUS | Status: DC | PRN
  Administered 2021-01-02: 5000 [IU] via TOPICAL

## 2021-01-02 MED ORDER — FENTANYL CITRATE (PF) 100 MCG/2ML IJ SOLN
INTRAMUSCULAR | Status: DC | PRN
  Administered 2021-01-02 (×2): 50 ug via INTRAVENOUS
  Administered 2021-01-02: 100 ug via INTRAVENOUS

## 2021-01-02 MED ORDER — LIDOCAINE HCL (CARDIAC) PF 100 MG/5ML IV SOSY
PREFILLED_SYRINGE | INTRAVENOUS | Status: DC | PRN
  Administered 2021-01-02: 100 mg via INTRAVENOUS

## 2021-01-02 MED ORDER — ACETAMINOPHEN 325 MG PO TABS: 650.0000 mg | ORAL_TABLET | ORAL | Status: DC | PRN

## 2021-01-02 MED ORDER — FENTANYL CITRATE (PF) 100 MCG/2ML IJ SOLN
INTRAMUSCULAR | Status: AC
  Filled 2021-01-02: qty 2

## 2021-01-02 MED ORDER — ONDANSETRON HCL 4 MG/2ML IJ SOLN
4.0000 mg | INTRAMUSCULAR | Status: DC | PRN
  Administered 2021-01-03: 4 mg via INTRAVENOUS
  Filled 2021-01-02: qty 2

## 2021-01-02 MED ORDER — MEPERIDINE HCL 25 MG/ML IJ SOLN: 6.2500 mg | INTRAMUSCULAR | Status: DC | PRN

## 2021-01-02 MED ORDER — DIPHENHYDRAMINE HCL 50 MG/ML IJ SOLN: 12.5000 mg | Freq: Four times a day (QID) | INTRAMUSCULAR | Status: DC | PRN

## 2021-01-02 MED ORDER — DEXAMETHASONE SODIUM PHOSPHATE 10 MG/ML IJ SOLN
INTRAMUSCULAR | Status: DC | PRN
  Administered 2021-01-02: 10 mg via INTRAVENOUS

## 2021-01-02 MED ORDER — 0.9 % SODIUM CHLORIDE (POUR BTL) OPTIME
TOPICAL | Status: DC | PRN
  Administered 2021-01-02: 500 mL

## 2021-01-02 MED ORDER — HEPARIN SODIUM (PORCINE) 5000 UNIT/ML IJ SOLN: 5000.0000 [IU] | Freq: Three times a day (TID) | INTRAMUSCULAR | Status: DC

## 2021-01-02 MED ORDER — ONDANSETRON HCL 4 MG/2ML IJ SOLN: 4.0000 mg | Freq: Once | INTRAMUSCULAR | Status: DC | PRN

## 2021-01-02 MED ORDER — OXYCODONE-ACETAMINOPHEN 5-325 MG PO TABS
1.0000 | ORAL_TABLET | ORAL | Status: DC | PRN
  Administered 2021-01-02 – 2021-01-03 (×4): 2 via ORAL
  Filled 2021-01-02 (×4): qty 2

## 2021-01-02 MED ORDER — ACETAMINOPHEN 10 MG/ML IV SOLN
INTRAVENOUS | Status: AC
  Filled 2021-01-02: qty 100

## 2021-01-02 MED ORDER — CEFAZOLIN SODIUM-DEXTROSE 2-4 GM/100ML-% IV SOLN
INTRAVENOUS | Status: AC
  Filled 2021-01-02: qty 100

## 2021-01-02 MED ORDER — CEFAZOLIN SODIUM-DEXTROSE 1-4 GM/50ML-% IV SOLN
1.0000 g | Freq: Three times a day (TID) | INTRAVENOUS | Status: AC
  Administered 2021-01-02 (×2): 1 g via INTRAVENOUS
  Filled 2021-01-02 (×3): qty 50

## 2021-01-02 MED ORDER — FENTANYL CITRATE (PF) 250 MCG/5ML IJ SOLN
INTRAMUSCULAR | Status: AC
  Filled 2021-01-02: qty 5

## 2021-01-02 MED ORDER — MIDAZOLAM HCL 2 MG/2ML IJ SOLN
INTRAMUSCULAR | Status: DC | PRN
  Administered 2021-01-02: 2 mg via INTRAVENOUS

## 2021-01-02 MED ORDER — PHENYLEPHRINE HCL (PRESSORS) 10 MG/ML IV SOLN
INTRAVENOUS | Status: DC | PRN
  Administered 2021-01-02 (×3): 100 ug via INTRAVENOUS
  Administered 2021-01-02: 200 ug via INTRAVENOUS

## 2021-01-02 MED ORDER — MORPHINE SULFATE (PF) 2 MG/ML IV SOLN
2.0000 mg | INTRAVENOUS | Status: DC | PRN
  Administered 2021-01-02 – 2021-01-03 (×4): 2 mg via INTRAVENOUS
  Filled 2021-01-02 (×4): qty 1

## 2021-01-02 MED ORDER — OXYBUTYNIN CHLORIDE 5 MG PO TABS: 5.0000 mg | ORAL_TABLET | Freq: Three times a day (TID) | ORAL | Status: DC | PRN

## 2021-01-02 MED ORDER — OXYCODONE-ACETAMINOPHEN 5-325 MG PO TABS: 1.0000 | ORAL_TABLET | ORAL | 0 refills | Status: DC | PRN

## 2021-01-02 MED ORDER — PROPOFOL 10 MG/ML IV BOLUS
INTRAVENOUS | Status: AC
  Filled 2021-01-02: qty 20

## 2021-01-02 MED ORDER — ONDANSETRON HCL 4 MG/2ML IJ SOLN
INTRAMUSCULAR | Status: DC | PRN
  Administered 2021-01-02: 4 mg via INTRAVENOUS

## 2021-01-02 MED ORDER — PHENYLEPHRINE HCL (PRESSORS) 10 MG/ML IV SOLN
INTRAVENOUS | Status: AC
  Filled 2021-01-02: qty 1

## 2021-01-02 MED ORDER — LACTATED RINGERS IV SOLN: INTRAVENOUS | Status: DC | PRN

## 2021-01-02 MED ORDER — SUGAMMADEX SODIUM 200 MG/2ML IV SOLN
INTRAVENOUS | Status: DC | PRN
  Administered 2021-01-02: 200 mg via INTRAVENOUS

## 2021-01-02 MED ORDER — ZOLPIDEM TARTRATE 5 MG PO TABS: 5.0000 mg | ORAL_TABLET | Freq: Every evening | ORAL | Status: DC | PRN

## 2021-01-02 MED ORDER — BUPIVACAINE LIPOSOME 1.3 % IJ SUSP
INTRAMUSCULAR | Status: DC | PRN
  Administered 2021-01-02: 20 mL

## 2021-01-02 SURGICAL SUPPLY — 100 items
"PENCIL ELECTRO HAND CTR " (MISCELLANEOUS) ×1 IMPLANT
ADH SKN CLS APL DERMABOND .7 (GAUZE/BANDAGES/DRESSINGS) ×2
AGENT HMST MTR 8 SURGIFLO (HEMOSTASIS) ×1
APL ESCP 34 STRL LF DISP (HEMOSTASIS) ×1
APL PRP STRL LF DISP 70% ISPRP (MISCELLANEOUS) ×1
APPLICATOR SURGIFLO ENDO (HEMOSTASIS) ×2 IMPLANT
APPLIER CLIP ROT 10 11.4 M/L (STAPLE)
APPLIER CLIP ROT 13.4 12 LRG (CLIP)
APR CLP LRG 13.4X12 ROT 20 MLT (CLIP)
APR CLP MED LRG 11.4X10 (STAPLE)
BAG LAPAROSCOPIC 12 15 PORT 16 (BASKET) ×1 IMPLANT
BAG RETRIEVAL 12/15 (BASKET) ×2
BAG RETRIEVAL 12/15MM (BASKET) ×1
CANISTER SUCT 1200ML W/VALVE (MISCELLANEOUS) ×1 IMPLANT
CHLORAPREP W/TINT 26 (MISCELLANEOUS) ×3 IMPLANT
CLEANER CAUTERY TIP 5X5 PAD (MISCELLANEOUS) ×1 IMPLANT
CLIP APPLIE ROT 10 11.4 M/L (STAPLE) IMPLANT
CLIP APPLIE ROT 13.4 12 LRG (CLIP) IMPLANT
CLIP VESOLOCK LG 6/CT PURPLE (CLIP) ×5 IMPLANT
CLOSURE WOUND 1/2 X4 (GAUZE/BANDAGES/DRESSINGS)
COVER WAND RF STERILE (DRAPES) ×3 IMPLANT
CUTTER ECHEON FLEX ENDO 45 340 (ENDOMECHANICALS) IMPLANT
DEFOGGER SCOPE WARMER CLEARIFY (MISCELLANEOUS) ×3 IMPLANT
DERMABOND ADVANCED (GAUZE/BANDAGES/DRESSINGS) ×4
DERMABOND ADVANCED .7 DNX12 (GAUZE/BANDAGES/DRESSINGS) ×2 IMPLANT
DRAPE INCISE IOBAN 66X45 STRL (DRAPES) ×3 IMPLANT
DRAPE STERI POUCH LG 24X46 STR (DRAPES) ×3 IMPLANT
DRAPE SURG 17X11 SM STRL (DRAPES) ×12 IMPLANT
DRSG OPSITE POSTOP 4X8 (GAUZE/BANDAGES/DRESSINGS) ×2 IMPLANT
DRSG TEGADERM 2-3/8X2-3/4 SM (GAUZE/BANDAGES/DRESSINGS) ×4 IMPLANT
DRSG TEGADERM 4X4.75 (GAUZE/BANDAGES/DRESSINGS) ×2 IMPLANT
DRSG TELFA 3X8 NADH (GAUZE/BANDAGES/DRESSINGS) ×3 IMPLANT
ELECT CAUTERY BLADE 6.4 (BLADE) ×2 IMPLANT
ELECT CAUTERY NDL 2.0 MIC (NEEDLE) IMPLANT
ELECT CAUTERY NEEDLE 2.0 MIC (NEEDLE) ×3 IMPLANT
ELECT REM PT RETURN 9FT ADLT (ELECTROSURGICAL) ×3
ELECTRODE REM PT RTRN 9FT ADLT (ELECTROSURGICAL) ×1 IMPLANT
GLOVE SURG ENC MOIS LTX SZ6.5 (GLOVE) ×6 IMPLANT
GLOVE SURG UNDER LTX SZ6.5 (GLOVE) ×3 IMPLANT
GOWN STRL REUS W/ TWL LRG LVL3 (GOWN DISPOSABLE) ×3 IMPLANT
GOWN STRL REUS W/TWL LRG LVL3 (GOWN DISPOSABLE) ×9
GRASPER SUT TROCAR 14GX15 (MISCELLANEOUS) ×5 IMPLANT
HANDLE YANKAUER SUCT BULB TIP (MISCELLANEOUS) ×3 IMPLANT
HEMOSTAT SURGICEL 2X14 (HEMOSTASIS) IMPLANT
HOLDER FOLEY CATH W/STRAP (MISCELLANEOUS) ×3 IMPLANT
IRRIGATION STRYKERFLOW (MISCELLANEOUS) ×1 IMPLANT
IRRIGATOR STRYKERFLOW (MISCELLANEOUS) ×3
IV NS 1000ML (IV SOLUTION) ×3
IV NS 1000ML BAXH (IV SOLUTION) ×1 IMPLANT
KIT PINK PAD W/HEAD ARE REST (MISCELLANEOUS) ×3
KIT PINK PAD W/HEAD ARM REST (MISCELLANEOUS) ×1 IMPLANT
KIT TURNOVER KIT A (KITS) ×3 IMPLANT
KITTNER LAPARASCOPIC 5X40 (MISCELLANEOUS) ×3 IMPLANT
L-HOOK LAP DISP 36CM (ELECTROSURGICAL) ×3
LABEL OR SOLS (LABEL) ×3 IMPLANT
LHOOK LAP DISP 36CM (ELECTROSURGICAL) ×1 IMPLANT
LIGASURE LAP ATLAS 10MM 37CM (INSTRUMENTS) ×5 IMPLANT
LOOP RED MAXI  1X406MM (MISCELLANEOUS) ×2
LOOP VESSEL MAXI  1X406 RED (MISCELLANEOUS) ×1
LOOP VESSEL MAXI 1X406 RED (MISCELLANEOUS) ×1 IMPLANT
MANIFOLD NEPTUNE II (INSTRUMENTS) ×3 IMPLANT
NDL HYPO 21X1.5 SAFETY (NEEDLE) ×1 IMPLANT
NEEDLE HYPO 21X1.5 SAFETY (NEEDLE) ×3 IMPLANT
PACK LAP CHOLECYSTECTOMY (MISCELLANEOUS) ×3 IMPLANT
PAD CLEANER CAUTERY TIP 5X5 (MISCELLANEOUS) ×2
PAD DRESSING TELFA 3X8 NADH (GAUZE/BANDAGES/DRESSINGS) IMPLANT
PENCIL ELECTRO HAND CTR (MISCELLANEOUS) ×3 IMPLANT
RELOAD STAPLE 35X2.5 WHT THIN (STAPLE) IMPLANT
RELOAD STAPLE 45 2.6 WHT THIN (STAPLE) IMPLANT
RELOAD STAPLE 60 2.6 WHT THN (STAPLE) IMPLANT
RELOAD STAPLER WHITE 60MM (STAPLE) ×7 IMPLANT
SCISSORS METZENBAUM CVD 33 (INSTRUMENTS) ×3 IMPLANT
SET TUBE SMOKE EVAC HIGH FLOW (TUBING) ×3 IMPLANT
SPOGE SURGIFLO 8M (HEMOSTASIS) ×3
SPONGE LAP 18X18 RF (DISPOSABLE) ×5 IMPLANT
SPONGE SURGIFLO 8M (HEMOSTASIS) IMPLANT
STAPLE ECHEON FLEX 60 POW ENDO (STAPLE) ×2 IMPLANT
STAPLE RELOAD 2.5MM WHITE (STAPLE) IMPLANT
STAPLE RELOAD 45 WHT (STAPLE) IMPLANT
STAPLE RELOAD 45MM WHITE (STAPLE)
STAPLER RELOAD WHITE 60MM (STAPLE) ×21
STAPLER SKIN PROX 35W (STAPLE) ×5 IMPLANT
STAPLER VASCULAR ECHELON 35 (CUTTER) IMPLANT
STRIP CLOSURE SKIN 1/2X4 (GAUZE/BANDAGES/DRESSINGS) IMPLANT
SUT CHROMIC 0 CT 1 (SUTURE) IMPLANT
SUT MNCRL AB 4-0 PS2 18 (SUTURE) ×6 IMPLANT
SUT PDS AB 1 CT1 36 (SUTURE) ×12 IMPLANT
SUT VIC AB 0 CT1 36 (SUTURE) ×6 IMPLANT
SUT VIC AB 1 CT1 36 (SUTURE) IMPLANT
SUT VIC AB 2-0 SH 27 (SUTURE)
SUT VIC AB 2-0 SH 27XBRD (SUTURE) IMPLANT
SUT VIC AB 2-0 UR6 27 (SUTURE) IMPLANT
SUT VIC AB 4-0 FS2 27 (SUTURE) ×3 IMPLANT
SUT VICRYL 0 AB UR-6 (SUTURE) ×3 IMPLANT
SYS LAPSCP GELPORT 120MM (MISCELLANEOUS) ×3
SYSTEM LAPSCP GELPORT 120MM (MISCELLANEOUS) ×1 IMPLANT
TRAY FOLEY MTR SLVR 16FR STAT (SET/KITS/TRAYS/PACK) ×3 IMPLANT
TROCAR ENDOPATH XCEL 12X100 BL (ENDOMECHANICALS) ×3 IMPLANT
TROCAR XCEL 12X100 BLDLESS (ENDOMECHANICALS) ×3 IMPLANT
TROCAR XCEL NON-BLD 5MMX100MML (ENDOMECHANICALS) IMPLANT

## 2021-01-02 NOTE — Anesthesia Procedure Notes (Signed)
Procedure Name: Intubation Date/Time: 01/02/2021 7:52 AM Performed by: Philbert Riser, CRNA Pre-anesthesia Checklist: Patient identified, Emergency Drugs available, Suction available and Patient being monitored Patient Re-evaluated:Patient Re-evaluated prior to induction Oxygen Delivery Method: Circle system utilized Preoxygenation: Pre-oxygenation with 100% oxygen Induction Type: IV induction Ventilation: Mask ventilation without difficulty Laryngoscope Size: McGraph and 4 Grade View: Grade II Tube type: Oral Tube size: 7.5 mm Number of attempts: 1 Airway Equipment and Method: Stylet and Oral airway Placement Confirmation: ETT inserted through vocal cords under direct vision, positive ETCO2 and breath sounds checked- equal and bilateral Secured at: 23 cm Tube secured with: Tape Dental Injury: Teeth and Oropharynx as per pre-operative assessment

## 2021-01-02 NOTE — Interval H&P Note (Signed)
History and Physical Interval Note:  01/02/2021 7:19 AM  Larry Richardson  has presented today for surgery, with the diagnosis of Right Renal Mass.  The various methods of treatment have been discussed with the patient and family. After consideration of risks, benefits and other options for treatment, the patient has consented to  Procedure(s): HAND ASSISTED LAPAROSCOPIC NEPHRECTOMY (Right) as a surgical intervention.  The patient's history has been reviewed, patient examined, no change in status, stable for surgery.  I have reviewed the patient's chart and labs.  Questions were answered to the patient's satisfaction.    [facility] CTAB  Hollice Espy

## 2021-01-02 NOTE — Anesthesia Postprocedure Evaluation (Signed)
Anesthesia Post Note  Patient: Larry Richardson  Procedure(s) Performed: HAND ASSISTED LAPAROSCOPIC NEPHRECTOMY (Right)  Patient location during evaluation: PACU Anesthesia Type: General Level of consciousness: awake and alert, awake and oriented Pain management: pain level controlled Vital Signs Assessment: post-procedure vital signs reviewed and stable Respiratory status: spontaneous breathing, nonlabored ventilation and respiratory function stable Cardiovascular status: blood pressure returned to baseline and stable Postop Assessment: no apparent nausea or vomiting Anesthetic complications: no   No notable events documented.   Last Vitals:  Vitals:   01/02/21 1200 01/02/21 1215  BP: 111/72 111/71  Pulse: 70 63  Resp: 13 14  Temp: 36.6 C   SpO2: 98% 96%    Last Pain:  Vitals:   01/02/21 1215  TempSrc:   PainSc: 5                  Phill Mutter

## 2021-01-02 NOTE — Discharge Instructions (Signed)
·   Activity:  You are encouraged to ambulate frequently (about every hour during waking hours) to help prevent blood clots from forming in your legs or lungs.  However, you should not engage in any heavy lifting (> 5-10 lbs), strenuous activity, or straining. ° °· Diet: You should advance your diet as instructed by your physician.  It will be normal to have some bloating, nausea, and abdominal discomfort intermittently. ° °· Prescriptions:  You will be provided a prescription for pain medication to take as needed.  If your pain is not severe enough to require the prescription pain medication, you may take extra strength Tylenol instead which will have less side effects.  You should also take a prescribed stool softener to avoid straining with bowel movements as the prescription pain medication may constipate you. ° °· Incisions: You may remove your dressing bandages 48 hours after surgery if not removed in the hospital.  You will either have some small staples or special tissue glue at each of the incision sites. Once the bandages are removed (if present), the incisions may stay open to air.  You may start showering (but not soaking or bathing in water) the 2nd day after surgery and the incisions simply need to be patted dry after the shower.  No additional care is needed. ° °What to call us about: You should call the office if you develop fever > 101 or develop persistent vomiting, redness or draining around your incision, or any other concerning symptoms.   ° °[facility] °[address], [address] °[place], [place] [address] °[phone] ° ° °

## 2021-01-02 NOTE — Transfer of Care (Signed)
Immediate Anesthesia Transfer of Care Note  Patient: Larry Richardson  Procedure(s) Performed: HAND ASSISTED LAPAROSCOPIC NEPHRECTOMY (Right)  Patient Location: PACU  Anesthesia Type:General  Level of Consciousness: awake, alert  and oriented  Airway & Oxygen Therapy: Patient Spontanous Breathing and Patient connected to face mask oxygen  Post-op Assessment: Report given to RN and Post -op Vital signs reviewed and stable  Post vital signs: Reviewed and stable  Last Vitals:  Vitals Value Taken Time  BP    Temp    Pulse    Resp    SpO2      Last Pain:  Vitals:   01/02/21 0629  TempSrc: Temporal  PainSc: 0-No pain         Complications: No notable events documented.

## 2021-01-02 NOTE — Op Note (Signed)
PREOP DIAGNOSIS: Right renal mass  POSTOPERATIVE DIAGNOSIS: Right renal mass  OPERATION PERFORMED: Hand-assisted laparoscopic right radical nephrectomy.   SURGEON: Hollice Espy, MD   Assistant: Nickolas Madrid, MD   ANESTHESIA: General.   ESTIMATED BLOOD LOSS: 150 cc   DRAINS: 16-French Foley catheter.   COMPLICATIONS: None.  Indications: Large right renal mass, see H&P  FINDINGS: Large mass which clinically appeared to be encapsulated, was able to develop a posterior plane although slightly stuck in this area.  Hilum taken en bloc.  Minimal bleeding.   DESCRIPTION OF OPERATION: Informed consent was obtained. The patient was marked on the right side. IV antibiotics were given for bacterial prophylaxis on call to the operating room. SCDs were provided for DVT prophylaxis. The patient was taken to the operating room and placed supine on the operating table. General anesthesia was provided. A Foley catheter was placed to drain the bladder.  The patient was positioned in right lateral decubitus with the Right flank elevated about 70 degrees and the table flexed Lateral in the mid back. The right arm was placed in a padded airplane for support.  The patient was secured to the table with soft straps and then prepped and draped sterilely.   We had a time-out confirming the patient identification, planned procedure, surgical site, and all present were in agreement. All present were in agreement.   A 12 cm incision was made for the hand port in the midline which was carried around the umbilicus on the right side and back to the midline. The anterior fascia was incised and elevated.  The peritoneum was incised. An incisional block was provided with liposomal Marcaine. The GelPort was assembled. A 12 mm trocar was placed above the umbilicus in the midclavicular line in the right upper quadrant, and then the abdomen was insufflated. Laparoscopic survey revealed  no abnormalities or injuries. An additional 12 mm trocar placed slightly more lateral and inferior to the first to triangulate the kidney.  A 5 mm assistant port was placed superior near the midline and uses a liver retractor.  All port sites were then infiltrated with liposomal Marcaine. Zero Vicryls were placed at the 12 mm trocar sites with the Carter-Thomason device for closure at the end of the case.   The white line of Toldt was incised. The colon was reflected from the spleen to the pelvis. Gerota's fascia was lifted up off the lower pole of the kidney and the ureter and gonadal vessels were identified.  The upper pole of the kidney was mobilized off of the quadratus lumborum and further mobilized away from the liver. The adrenal gland was identified and spared during the upper pole dissection. The lower pole and lateral attachments were freed. The kidney was held laterally and the hilar dissection was completed.  I was able to encircle the hilar vessels en bloc including the renal artery and vein with my fingers.    The ureter was exposed, clipped distally using a Weck clip, and divided sharply. The ureter stump was confirmed hemostatic.  At this point, the renal hilum was divided with a 60 mm vascular load staple using the battery operated endovascular stapler.  I used additional staple loads posteriorly was relatively stuck as well as superior and superior medially to free the kidney.  The kidney was too large once it was completely freed to place an Endo Catch bag.  Pneumoperitoneal pressure was reduced to 7 mmHg and the abdomen was inspected; hemostasis was confirmed. The 12 mm trocars  were removed and the port sites closed with previously placed 0 Vicryl. The Gelport was removed.  I ended up having to extend this incision both a little superiorly and inferiorly in order to accommodate the very large tumor which not fit in the Endo Catch bag.  Ultimately, the tumor and kidney  was able to be delivered.  The midline fascia was closed using #1 PDS in figure-of-eight interrupted.  The subcutaneous tissue was closed using 0 vicryl. All the incisions were irrigated, patted dry, and then the skin was reapproximated with staples. The wounds were cleaned and dried and covered with honeycomb dressing and the smaller with Telfa and Tegaderm. All sponge, needle, and instrument counts were reported correct x2.   The patient was awakened from anesthesia and transferred to recovery in stable condition. There were no complications. The patient tolerated the procedure well. ______________________________    Hollice Espy, MD

## 2021-01-02 NOTE — Anesthesia Preprocedure Evaluation (Signed)
Anesthesia Evaluation  Patient identified by MRN, date of birth, ID band Patient awake    Reviewed: Allergy & Precautions, NPO status , Patient's Chart, lab work & pertinent test results  Airway Mallampati: II  TM Distance: >3 FB Neck ROM: Full    Dental no notable dental hx. (+) Chipped,    Pulmonary neg pulmonary ROS,    Pulmonary exam normal        Cardiovascular negative cardio ROS Normal cardiovascular exam     Neuro/Psych negative neurological ROS  negative psych ROS   GI/Hepatic negative GI ROS, Neg liver ROS,   Endo/Other  negative endocrine ROS  Renal/GU negative Renal ROS  negative genitourinary   Musculoskeletal negative musculoskeletal ROS (+)   Abdominal   Peds negative pediatric ROS (+)  Hematology negative hematology ROS (+)   Anesthesia Other Findings   Reproductive/Obstetrics negative OB ROS                            Anesthesia Physical Anesthesia Plan  ASA: 2  Anesthesia Plan: General   Post-op Pain Management:    Induction: Intravenous  PONV Risk Score and Plan: 2 and Ondansetron, Midazolam and Propofol infusion  Airway Management Planned: Oral ETT  Additional Equipment:   Intra-op Plan:   Post-operative Plan: Extubation in OR  Informed Consent:   Plan Discussed with: CRNA, Anesthesiologist and Surgeon  Anesthesia Plan Comments:         Anesthesia Quick Evaluation

## 2021-01-03 LAB — BASIC METABOLIC PANEL
Anion gap: 4 — ABNORMAL LOW (ref 5–15)
BUN: 18 mg/dL (ref 8–23)
CO2: 26 mmol/L (ref 22–32)
Calcium: 8.4 mg/dL — ABNORMAL LOW (ref 8.9–10.3)
Chloride: 109 mmol/L (ref 98–111)
Creatinine, Ser: 1.67 mg/dL — ABNORMAL HIGH (ref 0.61–1.24)
GFR, Estimated: 45 mL/min — ABNORMAL LOW (ref >60)
Glucose, Bld: 141 mg/dL — ABNORMAL HIGH (ref 70–99)
Potassium: 5 mmol/L (ref 3.5–5.1)
Sodium: 139 mmol/L (ref 135–145)

## 2021-01-03 LAB — CBC
HCT: 36.5 % — ABNORMAL LOW (ref 39.0–52.0)
Hemoglobin: 12.2 g/dL — ABNORMAL LOW (ref 13.0–17.0)
MCH: 28 pg (ref 26.0–34.0)
MCHC: 33.4 g/dL (ref 30.0–36.0)
MCV: 83.7 fL (ref 80.0–100.0)
Platelets: 194 10*3/uL (ref 150–400)
RBC: 4.36 MIL/uL (ref 4.22–5.81)
RDW: 13.2 % (ref 11.5–15.5)
WBC: 13.2 10*3/uL — ABNORMAL HIGH (ref 4.0–10.5)
nRBC: 0 % (ref 0.0–0.2)

## 2021-01-03 MED ORDER — ONDANSETRON HCL 4 MG PO TABS: 4.0000 mg | ORAL_TABLET | Freq: Three times a day (TID) | ORAL | 0 refills | Status: DC | PRN

## 2021-01-03 NOTE — Discharge Summary (Signed)
Date of admission: 01/02/2021  Date of discharge: 01/03/2021  Admission diagnosis: Right renal mass  Discharge diagnosis: Right renal mass  Secondary diagnoses:  Patient Active Problem List   Diagnosis Date Noted   Right renal mass 01/02/2021   Vomiting 11/10/2020   Right buttock pain 06/30/2020   Onychomycosis 06/30/2020   Encounter for general adult medical examination with abnormal findings 05/29/2015   Elevated LDL cholesterol level 07/17/2007   COLONIC POLYPS, HX OF 07/17/2007    History and Physical: For full details, please see admission history and physical. Briefly, Larry Richardson is a 65 y.o. year old patient with 11 cm right renal mass. He has elected to peruse radical lap HA nephrectomy.  Hospital Course: Patient tolerated the procedure well.  He was then transferred to the floor after an uneventful PACU stay.  His hospital course was uncomplicated.  On POD#1 he had met discharge criteria: was eating a regular diet, was up and ambulating independently,  pain was well controlled, was voiding without a catheter, and was ready to for discharge.  Physical Exam Constitutional:      Appearance: Normal appearance.  Cardiovascular:     Rate and Rhythm: Normal rate.  Abdominal:     General: Abdomen is flat.     Comments: Wounds c/d/I, no erythema under honeycomb dressing, c/d/i  Genitourinary:    Penis: Normal.      Comments: Clear yellow urine with foley Skin:    General: Skin is warm and dry.  Neurological:     General: No focal deficit present.     Mental Status: He is alert and oriented to person, place, and time.  Psychiatric:        Mood and Affect: Mood normal.      Laboratory values:  Recent Labs    01/03/21 0513  WBC 13.2*  HGB 12.2*  HCT 36.5*   Recent Labs    01/03/21 0513  NA 139  K 5.0  CL 109  CO2 26  GLUCOSE 141*  BUN 18  CREATININE 1.67*  CALCIUM 8.4*   No results for input(s): LABPT, INR in the last 72 hours. No results for  input(s): LABURIN in the last 72 hours. Results for orders placed or performed during the hospital encounter of 12/31/20  SARS CORONAVIRUS 2 (TAT 6-24 HRS) Nasopharyngeal Nasopharyngeal Swab     Status: None   Collection Time: 12/31/20  9:08 AM   Specimen: Nasopharyngeal Swab  Result Value Ref Range Status   SARS Coronavirus 2 NEGATIVE NEGATIVE Final    Comment: (NOTE) SARS-CoV-2 target nucleic acids are NOT DETECTED.  The SARS-CoV-2 RNA is generally detectable in upper and lower respiratory specimens during the acute phase of infection. Negative results do not preclude SARS-CoV-2 infection, do not rule out co-infections with other pathogens, and should not be used as the sole basis for treatment or other patient management decisions. Negative results must be combined with clinical observations, patient history, and epidemiological information. The expected result is Negative.  Fact Sheet for Patients: SugarRoll.be  Fact Sheet for Healthcare Providers: https://www.woods-mathews.com/  This test is not yet approved or cleared by the [place] FDA and  has been authorized for detection and/or diagnosis of SARS-CoV-2 by FDA under an Emergency Use Authorization (EUA). This EUA will remain  in effect (meaning this test can be used) for the duration of the COVID-19 declaration under Se ction 564(b)(1) of the Act, 21 U.S.C. section 360bbb-3(b)(1), unless the authorization is terminated or revoked sooner.  Performed  at [facility], [place] [address] [place], [place] [address]     Disposition: Home  Discharge instruction: The patient was instructed to be ambulatory but told to refrain from heavy lifting, strenuous activity, or driving.   Discharge medications:  Allergies as of 01/03/2021   No Active Allergies      Medication List     TAKE these medications    docusate sodium 100 MG capsule Commonly known as: COLACE Take 1  capsule (100 mg total) by mouth 2 (two) times daily.   ondansetron 4 MG tablet Commonly known as: Zofran Take 1 tablet (4 mg total) by mouth every 8 (eight) hours as needed for nausea or vomiting.   oxyCODONE-acetaminophen 5-325 MG tablet Commonly known as: Percocet Take 1-2 tablets by mouth every 4 (four) hours as needed for moderate pain or severe pain.       ASK your doctor about these medications    rosuvastatin 20 MG tablet Commonly known as: CRESTOR TAKE 1 TABLET BY MOUTH EVERY DAY        Followup:   Follow-up Information     Debroah Loop, PA-C Follow up in 1 week(s).   Specialty: Urology Why: staple removal Contact information: [place] [address] [phone]

## 2021-01-03 NOTE — Plan of Care (Signed)
Patient has been up and ambulating, foley removed and has voided, tolerating diet.  Discharged to home with wife

## 2021-01-08 ENCOUNTER — Encounter: Payer: Self-pay | Admitting: Physician Assistant

## 2021-01-08 ENCOUNTER — Ambulatory Visit: Payer: Medicare HMO | Admitting: Physician Assistant

## 2021-01-08 ENCOUNTER — Other Ambulatory Visit: Payer: Self-pay

## 2021-01-08 VITALS — BP 167/80 | HR 103 | Ht 74.0 in | Wt 183.0 lb

## 2021-01-08 DIAGNOSIS — N2889 Other specified disorders of kidney and ureter: Secondary | ICD-10-CM

## 2021-01-08 NOTE — Progress Notes (Signed)
Patient presented to clinic today for postoperative staple removal.  He is accompanied by his wife, who contributes to HPI.  He reports overall feeling well since surgery with mild abdominal soreness.  His primary complaint today is ongoing postoperative constipation.  He continues to take stool softeners, drink high-fiber fruit juices, ambulate, and has tried MiraLAX as well.  He has not yet had a bowel movement since surgery, however he is passing flatus and he denies severe abdominal pain or distention today.  On physical exam, patient has 4 abdominal wounds which appear to be well-healing without significant erythema or purulent drainage.  Each of these are held together in part by overlying staples.  Using Betadine , I cleaned along the incisions and staple lines, then I removed his staples in an alternating pattern, replacing Steri-Strips following removal.  I then removed his remaining staples and added additional Steri-Strips where there was sufficient gap to allow for placement.  I elected not to place a Steri-Strip over his smallest incision, located anteriorly on the right hemiabdomen, which was held with a solitary staple as this appeared to be well-healed and I did not feel it was necessary.  I counseled the patient that the Steri-Strips will fall off on their own over the course of the next 7 to 10 days.  Patient tolerated staple removal well.  I counseled him to continue stool softeners, hydration, and ambulation and return to clinic or proceed to the emergency department if he develops abdominal distention, pain, or stops passing flatus.  He expressed understanding.   Surgical pathology pending, will defer to Dr. Erlene Quan to discuss results with the patient.  Additionally, we have scheduled his postop follow-up with her in 4 weeks.

## 2021-01-15 ENCOUNTER — Other Ambulatory Visit: Payer: Self-pay

## 2021-01-15 ENCOUNTER — Telehealth: Payer: Self-pay

## 2021-01-15 ENCOUNTER — Ambulatory Visit (INDEPENDENT_AMBULATORY_CARE_PROVIDER_SITE_OTHER): Payer: Medicare HMO | Admitting: Physician Assistant

## 2021-01-15 ENCOUNTER — Encounter: Payer: Self-pay | Admitting: Emergency Medicine

## 2021-01-15 ENCOUNTER — Emergency Department
Admission: EM | Admit: 2021-01-15 | Discharge: 2021-01-15 | Disposition: A | Discharge status: Home / Self Care | Payer: Medicare HMO | Attending: Emergency Medicine | Admitting: Emergency Medicine

## 2021-01-15 ENCOUNTER — Ambulatory Visit
Admission: RE | Admit: 2021-01-15 | Discharge: 2021-01-15 | Disposition: A | Discharge status: Home / Self Care | Payer: Medicare HMO | Source: Ambulatory Visit | Attending: Physician Assistant | Admitting: Physician Assistant

## 2021-01-15 ENCOUNTER — Telehealth: Payer: Self-pay | Admitting: Urology

## 2021-01-15 ENCOUNTER — Other Ambulatory Visit
Admission: RE | Admit: 2021-01-15 | Discharge: 2021-01-15 | Disposition: A | Discharge status: Home / Self Care | Payer: Medicare HMO | Source: Ambulatory Visit | Attending: Urology | Admitting: Urology

## 2021-01-15 ENCOUNTER — Emergency Department: Payer: Medicare HMO

## 2021-01-15 ENCOUNTER — Telehealth: Payer: Self-pay | Admitting: Physician Assistant

## 2021-01-15 ENCOUNTER — Encounter: Payer: Self-pay | Admitting: Physician Assistant

## 2021-01-15 VITALS — BP 114/78 | HR 89 | Ht 74.0 in | Wt 183.0 lb

## 2021-01-15 DIAGNOSIS — R0602 Shortness of breath: Secondary | ICD-10-CM | POA: Insufficient documentation

## 2021-01-15 DIAGNOSIS — K7689 Other specified diseases of liver: Secondary | ICD-10-CM | POA: Diagnosis not present

## 2021-01-15 DIAGNOSIS — Z905 Acquired absence of kidney: Secondary | ICD-10-CM | POA: Diagnosis not present

## 2021-01-15 DIAGNOSIS — Z9889 Other specified postprocedural states: Secondary | ICD-10-CM

## 2021-01-15 DIAGNOSIS — R42 Dizziness and giddiness: Secondary | ICD-10-CM | POA: Diagnosis not present

## 2021-01-15 DIAGNOSIS — R0902 Hypoxemia: Secondary | ICD-10-CM

## 2021-01-15 DIAGNOSIS — R55 Syncope and collapse: Secondary | ICD-10-CM | POA: Diagnosis not present

## 2021-01-15 DIAGNOSIS — N2889 Other specified disorders of kidney and ureter: Secondary | ICD-10-CM | POA: Diagnosis not present

## 2021-01-15 DIAGNOSIS — R188 Other ascites: Secondary | ICD-10-CM | POA: Diagnosis not present

## 2021-01-15 DIAGNOSIS — Z85528 Personal history of other malignant neoplasm of kidney: Secondary | ICD-10-CM | POA: Diagnosis not present

## 2021-01-15 DIAGNOSIS — N400 Enlarged prostate without lower urinary tract symptoms: Secondary | ICD-10-CM | POA: Diagnosis not present

## 2021-01-15 LAB — COMPREHENSIVE METABOLIC PANEL
ALT: 49 U/L — ABNORMAL HIGH (ref 0–44)
AST: 28 U/L (ref 15–41)
Albumin: 3.9 g/dL (ref 3.5–5.0)
Alkaline Phosphatase: 57 U/L (ref 38–126)
Anion gap: 7 (ref 5–15)
BUN: 18 mg/dL (ref 8–23)
CO2: 29 mmol/L (ref 22–32)
Calcium: 9.1 mg/dL (ref 8.9–10.3)
Chloride: 103 mmol/L (ref 98–111)
Creatinine, Ser: 1.49 mg/dL — ABNORMAL HIGH (ref 0.61–1.24)
GFR, Estimated: 52 mL/min — ABNORMAL LOW (ref >60)
Glucose, Bld: 105 mg/dL — ABNORMAL HIGH (ref 70–99)
Potassium: 4.6 mmol/L (ref 3.5–5.1)
Sodium: 139 mmol/L (ref 135–145)
Total Bilirubin: 0.6 mg/dL (ref 0.3–1.2)
Total Protein: 6.9 g/dL (ref 6.5–8.1)

## 2021-01-15 LAB — BASIC METABOLIC PANEL
Anion gap: 8 (ref 5–15)
BUN: 18 mg/dL (ref 8–23)
CO2: 30 mmol/L (ref 22–32)
Calcium: 9.1 mg/dL (ref 8.9–10.3)
Chloride: 103 mmol/L (ref 98–111)
Creatinine, Ser: 1.44 mg/dL — ABNORMAL HIGH (ref 0.61–1.24)
GFR, Estimated: 54 mL/min — ABNORMAL LOW (ref >60)
Glucose, Bld: 101 mg/dL — ABNORMAL HIGH (ref 70–99)
Potassium: 4.9 mmol/L (ref 3.5–5.1)
Sodium: 141 mmol/L (ref 135–145)

## 2021-01-15 LAB — CBC WITH DIFFERENTIAL/PLATELET
Abs Immature Granulocytes: 0.05 10*3/uL (ref 0.00–0.07)
Abs Immature Granulocytes: 0.05 10*3/uL (ref 0.00–0.07)
Basophils Absolute: 0.1 10*3/uL (ref 0.0–0.1)
Basophils Absolute: 0.1 10*3/uL (ref 0.0–0.1)
Basophils Relative: 1 %
Basophils Relative: 1 %
Eosinophils Absolute: 0.9 10*3/uL — ABNORMAL HIGH (ref 0.0–0.5)
Eosinophils Absolute: 0.9 10*3/uL — ABNORMAL HIGH (ref 0.0–0.5)
Eosinophils Relative: 9 %
Eosinophils Relative: 8 %
HCT: 40.8 % (ref 39.0–52.0)
HCT: 38.5 % — ABNORMAL LOW (ref 39.0–52.0)
Hemoglobin: 13.5 g/dL (ref 13.0–17.0)
Hemoglobin: 12.8 g/dL — ABNORMAL LOW (ref 13.0–17.0)
Immature Granulocytes: 1 %
Immature Granulocytes: 1 %
Lymphocytes Relative: 27 %
Lymphocytes Relative: 19 %
Lymphs Abs: 2.8 10*3/uL (ref 0.7–4.0)
Lymphs Abs: 2 10*3/uL (ref 0.7–4.0)
MCH: 28.1 pg (ref 26.0–34.0)
MCH: 28.4 pg (ref 26.0–34.0)
MCHC: 33.1 g/dL (ref 30.0–36.0)
MCHC: 33.2 g/dL (ref 30.0–36.0)
MCV: 85 fL (ref 80.0–100.0)
MCV: 85.4 fL (ref 80.0–100.0)
Monocytes Absolute: 0.7 10*3/uL (ref 0.1–1.0)
Monocytes Absolute: 0.6 10*3/uL (ref 0.1–1.0)
Monocytes Relative: 6 %
Monocytes Relative: 6 %
Neutro Abs: 6.2 10*3/uL (ref 1.7–7.7)
Neutro Abs: 7 10*3/uL (ref 1.7–7.7)
Neutrophils Relative %: 56 %
Neutrophils Relative %: 65 %
Platelets: 233 10*3/uL (ref 150–400)
Platelets: 214 10*3/uL (ref 150–400)
RBC: 4.8 MIL/uL (ref 4.22–5.81)
RBC: 4.51 MIL/uL (ref 4.22–5.81)
RDW: 13.4 % (ref 11.5–15.5)
RDW: 13.5 % (ref 11.5–15.5)
WBC: 10.7 10*3/uL — ABNORMAL HIGH (ref 4.0–10.5)
WBC: 10.6 10*3/uL — ABNORMAL HIGH (ref 4.0–10.5)
nRBC: 0 % (ref 0.0–0.2)
nRBC: 0 % (ref 0.0–0.2)

## 2021-01-15 LAB — TROPONIN I (HIGH SENSITIVITY): Troponin I (High Sensitivity): 4 ng/L (ref <18)

## 2021-01-15 LAB — LACTIC ACID, PLASMA: Lactic Acid, Venous: 0.9 mmol/L (ref 0.5–1.9)

## 2021-01-15 MED ORDER — IOHEXOL 350 MG/ML SOLN
60.0000 mL | Freq: Once | INTRAVENOUS | Status: AC | PRN
  Administered 2021-01-15: 60 mL via INTRAVENOUS

## 2021-01-15 NOTE — Telephone Encounter (Signed)
Patient's wife called the office this morning to report syncopal episodes (once on Sunday evening and twice yesterday).

## 2021-01-15 NOTE — Addendum Note (Signed)
Addended by: Mickle Plumb on: 01/15/2021 02:25 PM   Modules accepted: Orders

## 2021-01-15 NOTE — Discharge Instructions (Addendum)
Dr. Erlene Quan should call you with a follow-up appointment tomorrow.  Please return here for further episodes of lightheadedness or passing out.  Also return for chest pain fever vomiting or if you develop anything more than mild pain in your back or belly.

## 2021-01-15 NOTE — Telephone Encounter (Signed)
Both.  We need to see him ASAP for labs (at hospital lab) and vitals.    Hollice Espy, MD

## 2021-01-15 NOTE — ED Provider Notes (Signed)
[facility] Emergency Department Provider Note  ____________________________________________   Event Date/Time   First MD Initiated Contact with Patient 01/15/21 1953     (approximate)  I have reviewed the triage vital signs and the nursing notes.   HISTORY  Chief Complaint Loss of Consciousness    HPI Larry Richardson is a 65 y.o. male who comes in after having a nephrectomy for renal cell cancer last month on the 24th.  On Sunday he got lightheaded and passed out.  Last night Wednesday night he also got lightheaded twice but did not pass out.  Today he feels fine.  He has been eating and drinking normally.  Has been urinating normally.  He is not having any belly pain.  He does have a small bruise on his left flank it was a right nephrectomy.  That has been there for at least 2 days without changing any.  Patient had some shortness of breath today.  He had a CT angio of the chest today which did not show any PE but did show some fluid in the bed of the kidney and a lot of free air more than was thought likely due to the surgery on the 24th.  Patient came to the emergency room for further evaluation.  Patient is not been having any chest pain or belly pain today.         Past Medical History:  Diagnosis Date   Cancer ([place]) 12/2020   renal cell cancer   Chickenpox    Colon polyps    Diverticulosis    Hemorrhoids    Had blood in his stool 12-13 years ago, had colonoscopy and was found to have hemorrhoids   Hyperlipidemia     Patient Active Problem List   Diagnosis Date Noted   Right renal mass 01/02/2021   Vomiting 11/10/2020   Right buttock pain 06/30/2020   Onychomycosis 06/30/2020   Encounter for general adult medical examination with abnormal findings 05/29/2015   Elevated LDL cholesterol level 07/17/2007   COLONIC POLYPS, HX OF 07/17/2007    Past Surgical History:  Procedure Laterality Date   ACHILLES TENDON REPAIR Right 1980    COLONOSCOPY     LAPAROSCOPIC NEPHRECTOMY, HAND ASSISTED Right 01/02/2021   Procedure: HAND ASSISTED LAPAROSCOPIC NEPHRECTOMY;  Surgeon: Hollice Espy, MD;  Location: [facility] ORS;  Service: Urology;  Laterality: Right;    Prior to Admission medications   Medication Sig Start Date End Date Taking? Authorizing Provider  docusate sodium (COLACE) 100 MG capsule Take 1 capsule (100 mg total) by mouth 2 (two) times daily. 01/02/21   Hollice Espy, MD  ondansetron (ZOFRAN) 4 MG tablet Take 1 tablet (4 mg total) by mouth every 8 (eight) hours as needed for nausea or vomiting. Patient not taking: Reported on 01/15/2021 01/03/21   Hollice Espy, MD  oxyCODONE-acetaminophen (PERCOCET) 5-325 MG tablet Take 1-2 tablets by mouth every 4 (four) hours as needed for moderate pain or severe pain. Patient not taking: Reported on 01/15/2021 01/02/21   Hollice Espy, MD  rosuvastatin (CRESTOR) 20 MG tablet TAKE 1 TABLET BY MOUTH EVERY DAY Patient taking differently: Take 20 mg by mouth daily. 12/22/20   Leone Haven, MD    Allergies Patient has no known allergies.  Family History  Problem Relation Age of Onset   Breast cancer Unknown        Mom   Prostate cancer Unknown        Grandfather   Hyperlipidemia Unknown  Parent   Hypertension Unknown        Parent   Leukemia Unknown        Father    Social History Social History   Tobacco Use   Smoking status: Never    Passive exposure: Never   Smokeless tobacco: Never  Vaping Use   Vaping Use: Never used  Substance Use Topics   Alcohol use: Yes    Alcohol/week: 2.0 standard drinks    Types: 2 Standard drinks or equivalent per week   Drug use: No    Review of Systems Currently Constitutional: No fever/chills Eyes: No visual changes. ENT: No sore throat. Cardiovascular: Denies chest pain. Respiratory: Denies shortness of breath. Gastrointestinal: No abdominal pain.  No nausea, no vomiting.  No diarrhea.  No  constipation. Genitourinary: Negative for dysuria. Musculoskeletal: Negative for back pain. Skin: Negative for rash. Neurological: Negative for headaches, focal weakness   ____________________________________________   PHYSICAL EXAM:  VITAL SIGNS: ED Triage Vitals  Enc Vitals Group     BP 01/15/21 1710 130/76     Pulse Rate 01/15/21 1952 63     Resp 01/15/21 1710 20     Temp 01/15/21 1710 97.9 F (36.6 C)     Temp Source 01/15/21 1710 Oral     SpO2 01/15/21 1710 98 %     Weight 01/15/21 1707 183 lb (83 kg)     Height 01/15/21 1707 6\' 2"  (1.88 m)     Head Circumference --      Peak Flow --      Pain Score 01/15/21 1706 0     Pain Loc --      Pain Edu? --      Excl. in [place]? --    Constitutional: Alert and oriented. Well appearing and in no acute distress. Eyes: Conjunctivae are normal. PERRL. EOMI. Head: Atraumatic except for healing laceration along the left eyebrow from his first fall. Nose: No congestion/rhinnorhea. Mouth/Throat: Mucous membranes are moist.  Oropharynx non-erythematous. Neck: No stridor.   Cardiovascular: Normal rate, regular rhythm. Grossly normal heart sounds.  Good peripheral circulation. Respiratory: Normal respiratory effort.  No retractions. Lungs CTAB. Gastrointestinal: Soft and nontender except for minimally around incision. No distention. No abdominal bruits. No CVA tenderness. Musculoskeletal: No lower extremity tenderness nor edema.   Neurologic:  Normal speech and language. No gross focal neurologic deficits are appreciated.  Skin:  Skin is warm, dry and intact. No rash noted. Psychiatric: Mood and affect are normal. Speech and behavior are normal.  ____________________________________________   LABS (all labs ordered are listed, but only abnormal results are displayed)  Labs Reviewed  COMPREHENSIVE METABOLIC PANEL - Abnormal; Notable for the following components:      Result Value   Glucose, Bld 105 (*)    Creatinine, Ser 1.49 (*)     ALT 49 (*)    GFR, Estimated 52 (*)    All other components within normal limits  CBC WITH DIFFERENTIAL/PLATELET - Abnormal; Notable for the following components:   WBC 10.6 (*)    Hemoglobin 12.8 (*)    HCT 38.5 (*)    Eosinophils Absolute 0.9 (*)    All other components within normal limits  LACTIC ACID, PLASMA  LACTIC ACID, PLASMA  TROPONIN I (HIGH SENSITIVITY)  TROPONIN I (HIGH SENSITIVITY)   ____________________________________________  EKG  EKG read interpreted by me shows inverted P waves diffusely otherwise normal.  The computer is reading ST elevation consider inferior injury.  There may be less than  a millimeter of ST elevation in lead III only. ____________________________________________  RADIOLOGY Gertha Calkin, personally viewed and evaluated these images (plain radiographs) as part of my medical decision making, as well as reviewing the written report by the radiologist.  ED MD interpretation: CT reviewed, report below appears to be correct.  Official radiology report(s): CT ABDOMEN PELVIS WO CONTRAST  Result Date: 01/15/2021 CLINICAL DATA:  Abnormal chest CT showing free intraperitoneal air. History of right nephrectomy 15 days ago. EXAM: CT ABDOMEN AND PELVIS WITHOUT CONTRAST TECHNIQUE: Multidetector CT imaging of the abdomen and pelvis was performed following the standard protocol without IV contrast. COMPARISON:  Chest CT from earlier today. FINDINGS: Lower chest: The lung bases are clear. Hepatobiliary: Small hepatic cysts are noted but no worrisome hepatic lesions or intrahepatic biliary dilatation. The gallbladder is grossly normal. No common bile duct dilatation. Pancreas: No mass, inflammation or ductal dilatation. Spleen: Normal size.  No focal lesions. Adrenals/Urinary Tract: Surgical changes from a right nephrectomy. There is a sizable air and fluid collection in the right nephrectomy bed consistent with an abscess. This measures approximately 5.7 x 4.3  cm. Both adrenal glands in the left kidney are unremarkable. Stomach/Bowel: The stomach, duodenum, small bowel colon grossly normal without contrast. I do not see any obvious bowel injury, bowel wall thickening or definite site bowel perforation. The hepatic flexure region of the colon is near the nephrectomy site but I do not see any obvious colonic injury. It is possible the patient perforated a gastric ulcer from the stress of surgery that sealed off. There is also a small to moderate amount of complex fluid in the pelvis which could be hematoma. Vascular/Lymphatic: Scattered vascular calcifications. No aneurysm. No mesenteric or retroperitoneal adenopathy. Reproductive: The prostate gland is mildly enlarged. The seminal vesicles are unremarkable. Other: Expected surgical changes involving the anterior abdominal. Musculoskeletal: No significant bony findings. IMPRESSION: 1. 5.7 x 4.3 cm air and fluid collection in the right nephrectomy bed consistent with an abscess. 2. Large amount of free intraperitoneal air suggesting a bowel perforation although no obvious source is identified. It is possible there was a gastric ulcer that perforated and sealed off. 3. Small to moderate high attenuation fluid in the pelvis suggesting hematoma or infection. Electronically Signed   By: Marijo Sanes M.D.   On: 01/15/2021 20:48   CT Angio Chest Pulmonary Embolism (PE) W or WO Contrast  Result Date: 01/15/2021 CLINICAL DATA:  Hypoxia for 4 days.  Recent right nephrectomy. EXAM: CT ANGIOGRAPHY CHEST WITH CONTRAST TECHNIQUE: Multidetector CT imaging of the chest was performed using the standard protocol during bolus administration of intravenous contrast. Multiplanar CT image reconstructions and MIPs were obtained to evaluate the vascular anatomy. CONTRAST:  69mL OMNIPAQUE IOHEXOL 350 MG/ML SOLN COMPARISON:  12/24/2020 FINDINGS: Cardiovascular: The heart is normal in size. No pericardial effusion. The aorta is normal in caliber.  No dissection. No aortic or coronary artery calcifications. The pulmonary arterial tree is fairly well opacified. No filling defects to suggest pulmonary embolism. Mediastinum/Nodes: No mediastinal or hilar mass or lymphadenopathy. The esophagus is grossly normal. Lungs/Pleura: No acute pulmonary findings. No pulmonary lesions or pulmonary nodules. Upper Abdomen: Surgical changes from recent right nephrectomy 2 weeks ago. There is a significant amount of free intraperitoneal air not expected 2 weeks after surgery and worrisome for bowel perforation. There is also an air-fluid level in the nephrectomy bed which could represent an abscess. Recommend surgical consultation. A CT abdomen pelvis may be helpful for further  evaluation. Musculoskeletal: No significant bony findings. Review of the MIP images confirms the above findings. IMPRESSION: 1. No CT findings for pulmonary embolism. 2. Normal thoracic aorta. 3. No acute pulmonary findings. 4. Significant amount of free intraperitoneal air, not expected 2 weeks after surgery and worrisome for bowel perforation. There is also an air-fluid level in the nephrectomy bed which could represent an abscess. These results were called by telephone at the time of interpretation on 01/15/2021 at 4:46 pm to provider Debroah Loop , who verbally acknowledged these results. Electronically Signed   By: Marijo Sanes M.D.   On: 01/15/2021 16:46   DG Chest Portable 1 View  Result Date: 01/15/2021 CLINICAL DATA:  Syncope EXAM: PORTABLE CHEST 1 VIEW COMPARISON:  Chest CT 01/15/2021 FINDINGS: The heart size and mediastinal contours are within normal limits. Both lungs are clear. The visualized skeletal structures are unremarkable. There is free air beneath the diaphragm. IMPRESSION: 1. Clear lung fields. 2. Free air beneath the diaphragm as seen on chest CT performed earlier today. Consider further evaluation with dedicated abdominal and pelvic CT as concern was raised for bowel  perforation on the chest CT performed earlier today. Electronically Signed   By: Donavan Foil M.D.   On: 01/15/2021 19:47    ____________________________________________   PROCEDURES  Procedure(s) performed (including Critical Care):  Procedures   ____________________________________________   INITIAL IMPRESSION / ASSESSMENT AND PLAN / ED COURSE  Patient looks well his labs are normal.  He is feeling well today.  I discussed the patient in detail with Dr. Glori Luis and Dr. Erlene Quan.  They reviewed his labs and CT scans and discussed his case among themselves.  They feel he is stable to go home they will follow him up outpatient and he will will of course return if he has any further problems.              ____________________________________________   FINAL CLINICAL IMPRESSION(S) / ED DIAGNOSES  Final diagnoses:  Syncope, unspecified syncope type     ED Discharge Orders     None        Note:  This document was prepared using Dragon voice recognition software and may include unintentional dictation errors.    Nena Polio, MD 01/15/21 [phone]

## 2021-01-15 NOTE — Progress Notes (Addendum)
Patient presented to clinic today with his wife with reports of 3 syncopal episodes over the past 4 days.  He states they have occurred when he stood up rather quickly and he immediately felt things were "going dark."  Stat labs obtained in the hospital lab today reveal no electrolyte abnormalities, downtrending creatinine following nephrectomy, and improving H&H.  He is normotensive and normocardic, but SpO2 is low at 94%. He reports no chest pain, but states he has felt "winded" over the past two days.  On physical exam, abdomen is flat, soft, and nontender. Surgical incisions are well healing with SteriStrips in place.  With hypoxemia, syncope, postoperative status, SOB, and known malignancy, recommend STAT CT angio chest to evaluate for possible PE.  Vitals:   01/15/21 1252  BP: 114/78  Pulse: 89  SpO2: 94%    Results for orders placed or performed during the hospital encounter of 88/28/00  Basic metabolic panel  Result Value Ref Range   Sodium 141 135 - 145 mmol/L   Potassium 4.9 3.5 - 5.1 mmol/L   Chloride 103 98 - 111 mmol/L   CO2 30 22 - 32 mmol/L   Glucose, Bld 101 (H) 70 - 99 mg/dL   BUN 18 8 - 23 mg/dL   Creatinine, Ser 1.44 (H) 0.61 - 1.24 mg/dL   Calcium 9.1 8.9 - 10.3 mg/dL   GFR, Estimated 54 (L) >60 mL/min   Anion gap 8 5 - 15  CBC with Differential/Platelet  Result Value Ref Range   WBC 10.7 (H) 4.0 - 10.5 K/uL   RBC 4.80 4.22 - 5.81 MIL/uL   Hemoglobin 13.5 13.0 - 17.0 g/dL   HCT 40.8 39.0 - 52.0 %   MCV 85.0 80.0 - 100.0 fL   MCH 28.1 26.0 - 34.0 pg   MCHC 33.1 30.0 - 36.0 g/dL   RDW 13.4 11.5 - 15.5 %   Platelets 233 150 - 400 K/uL   nRBC 0.0 0.0 - 0.2 %   Neutrophils Relative % 56 %   Neutro Abs 6.2 1.7 - 7.7 K/uL   Lymphocytes Relative 27 %   Lymphs Abs 2.8 0.7 - 4.0 K/uL   Monocytes Relative 6 %   Monocytes Absolute 0.7 0.1 - 1.0 K/uL   Eosinophils Relative 9 %   Eosinophils Absolute 0.9 (H) 0.0 - 0.5 K/uL   Basophils Relative 1 %   Basophils  Absolute 0.1 0.0 - 0.1 K/uL   Immature Granulocytes 1 %   Abs Immature Granulocytes 0.05 0.00 - 0.07 K/uL

## 2021-01-15 NOTE — Telephone Encounter (Signed)
Contacted patient's wife and notified her that we would like for patient to have labs done at the hospital today and for him to follow up with PA after for review and vital check. If normal we will then send to PCP for further evaluation. Patient's wife verbalized understanding and patient was added on to PA schedule at 1pm today, orders placed for lab draw at [facility]

## 2021-01-15 NOTE — ED Triage Notes (Signed)
Pt reports that he he has been having passing out they took his blood work and did a CT scan today here. He reports that his wife took a phone call for him to come to the ER to have an EKG and to be evaluated.

## 2021-01-15 NOTE — Telephone Encounter (Signed)
CT angio chest has resulted and I spoke with Dr. Candise Che via telephone.  No evidence of PE, however there is free intraperitoneal air with an air-fluid level in the nephrectomy bed.  In consultation with Dr. Erlene Quan, given no evidence of acute abdomen on physical exam today, stable vitals, and no leukocytosis, I have low concern for an intra-abdominal process at this time.  I attempted to expedite PCP follow-up, however they were not able to schedule him for another 6 days.  Given concerns for possible cardiogenic source of his syncopal episodes, I spoke with the patient and his wife via telephone today to report his results and encouraged him to follow-up in the emergency department for further evaluation including likely EKG.  They expressed understanding and are on their way there.  I contacted the emergency department to notify them of his impending arrival.

## 2021-01-15 NOTE — Telephone Encounter (Signed)
Sw Social worker at [facility]. CT angiogram approved. PA # [id number]. Radiology notified appt made STAT.

## 2021-01-16 ENCOUNTER — Encounter: Payer: Self-pay | Admitting: Urology

## 2021-01-16 NOTE — Telephone Encounter (Signed)
Pt called office and I read message from [employer].

## 2021-01-16 NOTE — Telephone Encounter (Signed)
Called patient to inform-scheduled follow up with Dr. Erlene Quan. Asked to return call

## 2021-01-21 ENCOUNTER — Ambulatory Visit: Payer: Medicare HMO | Admitting: Family Medicine

## 2021-01-21 ENCOUNTER — Telehealth: Payer: Self-pay | Admitting: Urology

## 2021-01-21 ENCOUNTER — Ambulatory Visit: Payer: Self-pay | Admitting: Urology

## 2021-01-21 NOTE — Telephone Encounter (Signed)
Called to check in with the patient today.  His pathology is still pending thus we pushed out his appointment for another week.  He is doing extremely well.  He is walking a mile a day.  No fevers or chills.  He has not had any more vasovagal/passing out episodes.  He denies any abdominal pain other than some small incisional pain.  He is having normal bowel movements.  Very reassuring, speaks against evolving abscess or bowel injury.  As such, we will continue to follow clinically.  Follow-up next week as scheduled.  Hollice Espy, MD

## 2021-01-22 ENCOUNTER — Other Ambulatory Visit: Payer: Self-pay | Admitting: Urology

## 2021-01-27 ENCOUNTER — Ambulatory Visit (INDEPENDENT_AMBULATORY_CARE_PROVIDER_SITE_OTHER): Payer: Medicare HMO | Admitting: Urology

## 2021-01-27 ENCOUNTER — Other Ambulatory Visit: Payer: Self-pay

## 2021-01-27 ENCOUNTER — Encounter: Payer: Self-pay | Admitting: Urology

## 2021-01-27 VITALS — BP 142/83 | HR 99 | Ht 74.0 in | Wt 183.0 lb

## 2021-01-27 DIAGNOSIS — N1831 Chronic kidney disease, stage 3a: Secondary | ICD-10-CM

## 2021-01-27 DIAGNOSIS — C499 Malignant neoplasm of connective and soft tissue, unspecified: Secondary | ICD-10-CM

## 2021-01-28 LAB — SURGICAL PATHOLOGY

## 2021-01-29 ENCOUNTER — Other Ambulatory Visit: Payer: Self-pay | Admitting: Urology

## 2021-01-29 DIAGNOSIS — C499 Malignant neoplasm of connective and soft tissue, unspecified: Secondary | ICD-10-CM

## 2021-01-29 NOTE — Progress Notes (Unsigned)
[email or web address]  [phone]) [phone]   Sharyn Lull, please help me refer this patient to [facility] sarcoma clinic

## 2021-01-30 ENCOUNTER — Ambulatory Visit: Admit: 2021-01-30 | Discharge: 2021-01-31 | Payer: MEDICARE

## 2021-01-30 ENCOUNTER — Encounter: Payer: Self-pay | Admitting: Urology

## 2021-01-30 DIAGNOSIS — C499 Malignant neoplasm of connective and soft tissue, unspecified: Principal | ICD-10-CM

## 2021-01-30 DIAGNOSIS — Z905 Acquired absence of kidney: Secondary | ICD-10-CM | POA: Diagnosis not present

## 2021-01-30 DIAGNOSIS — K661 Hemoperitoneum: Secondary | ICD-10-CM | POA: Diagnosis not present

## 2021-01-30 DIAGNOSIS — R932 Abnormal findings on diagnostic imaging of liver and biliary tract: Secondary | ICD-10-CM | POA: Diagnosis not present

## 2021-02-02 ENCOUNTER — Encounter
Admit: 2021-02-02 | Discharge: 2021-02-02 | Payer: MEDICARE | Attending: Student in an Organized Health Care Education/Training Program | Primary: Student in an Organized Health Care Education/Training Program

## 2021-02-02 DIAGNOSIS — C450 Mesothelioma of pleura: Principal | ICD-10-CM

## 2021-02-02 DIAGNOSIS — C499 Malignant neoplasm of connective and soft tissue, unspecified: Secondary | ICD-10-CM | POA: Diagnosis not present

## 2021-02-02 NOTE — Progress Notes (Signed)
01/27/2021 6:15 PM   Larry Richardson April 17, 1956 [id number]  Referring provider: Leone Haven, MD [place],  [place] [id number]  Chief Complaint  Patient presents with   Routine Post Op    HPI: 65 year old male who presents today for 4-week status post right radical nephrectomy for a large greater than 12 cm renal mass.  Surgical pathology had been pending for 4 weeks, second opinion sent to [facility] for identification.  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  Surgical margins were negative.    Postoperative course complicated by what is probably vasovagal episodes, dizziness/passing out with standing.  He underwent full evaluation including cardiac which was negative.  CT PE was also negative without evidence of metastatic disease.  He did have some postoperative changes in his abdomen with air-fluid levels concerning for possible abscess.  He does report that he is doing extremely well today.  He is having bowel movements.  He is looking to increase his activity but walking several miles a day.  He denies any abdominal pain.  He does feel the sutures underneath his skin but denies any distention.     PMH: Past Medical History:  Diagnosis Date   Cancer ([place]) 12/2020   renal cell cancer   Chickenpox    Colon polyps    Diverticulosis    Hemorrhoids    Had blood in his stool 12-13 years ago, had colonoscopy and was found to have hemorrhoids   Hyperlipidemia     Surgical History: Past Surgical History:  Procedure Laterality Date   ACHILLES TENDON REPAIR Right 1980   COLONOSCOPY     LAPAROSCOPIC NEPHRECTOMY, HAND ASSISTED Right 01/02/2021   Procedure: HAND ASSISTED LAPAROSCOPIC NEPHRECTOMY;  Surgeon: Hollice Espy, MD;  Location: [facility] ORS;  Service: Urology;  Laterality: Right;    Home Medications:  Allergies as of 01/27/2021   No Known  Allergies      Medication List        Accurate as of January 27, 2021 11:59 PM. If you have any questions, ask your nurse or doctor.          STOP taking these medications    ondansetron 4 MG tablet Commonly known as: Zofran Stopped by: Hollice Espy, MD   oxyCODONE-acetaminophen 5-325 MG tablet Commonly known as: Percocet Stopped by: Hollice Espy, MD       TAKE these medications    docusate sodium 100 MG capsule Commonly known as: COLACE Take 1 capsule (100 mg total) by mouth 2 (two) times daily.   rosuvastatin 20 MG tablet Commonly known as: CRESTOR TAKE 1 TABLET BY MOUTH EVERY DAY        Allergies: No Known Allergies  Family History: Family History  Problem Relation Age of Onset   Breast cancer Unknown        Mom   Prostate cancer Unknown        Grandfather   Hyperlipidemia Unknown        Parent   Hypertension Unknown        Parent   Leukemia Unknown        Father    Social History:  reports that he has never smoked. He has never been exposed to tobacco smoke. He has never used smokeless tobacco. He reports current alcohol use of about 2.0 standard drinks of alcohol per week. He reports that he does not use drugs.   Physical Exam: BP Marland Kitchen)  142/83   Pulse 99   Ht '6\' 2"'$  (1.88 m)   Wt 183 lb (83 kg)   BMI 23.50 kg/m   Constitutional:  Alert and oriented, No acute distress. HEENT: [name] AT, moist mucus membranes.  Trachea midline, no masses. Cardiovascular: No clubbing, cyanosis, or edema. Respiratory: Normal respiratory effort, no increased work of breathing. GI: Abdomen is soft, nontender, nondistended, midline incision now well-healed. Skin: No rashes, bruises or suspicious lesions. Neurologic: Grossly intact, no focal deficits, moving all 4 extremities. Psychiatric: Normal mood and affect.  Laboratory Data: Lab Results  Component Value Date   WBC 10.6 (H) 01/15/2021   HGB 12.8 (L) 01/15/2021   HCT 38.5 (L) 01/15/2021   MCV 85.4  01/15/2021   PLT 214 01/15/2021    Lab Results  Component Value Date   CREATININE 1.49 (H) 01/15/2021    Lab Results  Component Value Date   HGBA1C 5.5 06/30/2020    Urinalysis    Component Value Date/Time   APPEARANCEUR Clear 12/16/2020 1404   GLUCOSEU Negative 12/16/2020 1404   BILIRUBINUR Negative 12/16/2020 1404   PROTEINUR Negative 12/16/2020 1404   NITRITE Negative 12/16/2020 1404   LEUKOCYTESUR Negative 12/16/2020 1404    Lab Results  Component Value Date   LABMICR See below: 12/16/2020   WBCUA 0-5 12/16/2020   LABEPIT None seen 12/16/2020   BACTERIA None seen 12/16/2020     Assessment & Plan:    1. Sarcoma [facility]) Surgical pathology reviewed with the patient  We will plan on presenting him at tumor board on Thursday.  I will likely refer him to sarcoma center at [facility] for second opinion.  We will tentatively plan for cross-sectional imaging again in 3 months.  Notably, CT scan 01/15/21 did suggest possible abscess but he has no signs or symptoms of this; clinically is doing well and thus these changes likely represent postop changes.  Reassess at his follow-up imaging unless he clinically changes.  - CT Abdomen Pelvis W Contrast; Future  2. Stage 3a chronic kidney disease ([place]) New baseline creatinine, discussed solitary kidney precautions including avoidance of NSAIDs and dehydration   F/u 3 month with CT scan  Hollice Espy, MD  [facility] [address], [place], [place] [address] [phone]

## 2021-02-04 ENCOUNTER — Ambulatory Visit: Payer: Self-pay | Admitting: Urology

## 2021-02-17 ENCOUNTER — Ambulatory Visit
Admit: 2021-02-17 | Discharge: 2021-02-18 | Payer: MEDICARE | Attending: Student in an Organized Health Care Education/Training Program | Primary: Student in an Organized Health Care Education/Training Program

## 2021-02-17 DIAGNOSIS — Z79899 Other long term (current) drug therapy: Principal | ICD-10-CM

## 2021-02-17 DIAGNOSIS — C499 Malignant neoplasm of connective and soft tissue, unspecified: Principal | ICD-10-CM

## 2021-02-20 DIAGNOSIS — C499 Malignant neoplasm of connective and soft tissue, unspecified: Principal | ICD-10-CM

## 2021-03-06 ENCOUNTER — Ambulatory Visit: Admit: 2021-03-06 | Discharge: 2021-03-07 | Payer: MEDICARE

## 2021-03-06 DIAGNOSIS — Z79899 Other long term (current) drug therapy: Principal | ICD-10-CM

## 2021-03-06 DIAGNOSIS — C499 Malignant neoplasm of connective and soft tissue, unspecified: Principal | ICD-10-CM

## 2021-03-06 DIAGNOSIS — I080 Rheumatic disorders of both mitral and aortic valves: Secondary | ICD-10-CM | POA: Diagnosis not present

## 2021-03-09 ENCOUNTER — Ambulatory Visit: Admit: 2021-03-09 | Discharge: 2021-03-09 | Payer: MEDICARE

## 2021-03-09 DIAGNOSIS — C499 Malignant neoplasm of connective and soft tissue, unspecified: Principal | ICD-10-CM

## 2021-03-09 DIAGNOSIS — Z452 Encounter for adjustment and management of vascular access device: Secondary | ICD-10-CM | POA: Diagnosis not present

## 2021-03-09 MED ORDER — PEGFILGRASTIM-BMEZ 6 MG/0.6 ML SUBCUTANEOUS SYRINGE
3 refills | 0 days | Status: CP
Start: 2021-03-09 — End: ?
  Filled 2021-03-30: qty 0.6, 21d supply, fill #0

## 2021-03-10 ENCOUNTER — Other Ambulatory Visit: Admit: 2021-03-10 | Discharge: 2021-03-10 | Payer: MEDICARE

## 2021-03-10 ENCOUNTER — Ambulatory Visit
Admit: 2021-03-10 | Discharge: 2021-03-10 | Payer: MEDICARE | Attending: Student in an Organized Health Care Education/Training Program | Primary: Student in an Organized Health Care Education/Training Program

## 2021-03-10 DIAGNOSIS — C499 Malignant neoplasm of connective and soft tissue, unspecified: Principal | ICD-10-CM

## 2021-03-10 MED ORDER — OLANZAPINE 5 MG TABLET
ORAL_TABLET | Freq: Every evening | ORAL | 0 refills | 18 days | Status: CP
Start: 2021-03-10 — End: ?

## 2021-03-10 MED ORDER — PROCHLORPERAZINE MALEATE 10 MG TABLET
ORAL_TABLET | Freq: Four times a day (QID) | ORAL | 3 refills | 8 days | Status: CP | PRN
Start: 2021-03-10 — End: ?

## 2021-03-10 MED ORDER — ONDANSETRON HCL 8 MG TABLET
ORAL_TABLET | Freq: Three times a day (TID) | ORAL | 3 refills | 10 days | Status: CP | PRN
Start: 2021-03-10 — End: ?

## 2021-03-11 ENCOUNTER — Ambulatory Visit
Admit: 2021-03-11 | Discharge: 2021-03-14 | Disposition: A | Discharge status: Home / Self Care | Payer: MEDICARE | Admitting: Medical Oncology

## 2021-03-11 DIAGNOSIS — Z803 Family history of malignant neoplasm of breast: Secondary | ICD-10-CM | POA: Diagnosis not present

## 2021-03-11 DIAGNOSIS — N179 Acute kidney failure, unspecified: Secondary | ICD-10-CM | POA: Diagnosis not present

## 2021-03-11 DIAGNOSIS — Z20822 Contact with and (suspected) exposure to covid-19: Secondary | ICD-10-CM | POA: Diagnosis not present

## 2021-03-11 DIAGNOSIS — C499 Malignant neoplasm of connective and soft tissue, unspecified: Secondary | ICD-10-CM | POA: Diagnosis not present

## 2021-03-11 DIAGNOSIS — Z905 Acquired absence of kidney: Secondary | ICD-10-CM | POA: Diagnosis not present

## 2021-03-11 DIAGNOSIS — R066 Hiccough: Secondary | ICD-10-CM | POA: Diagnosis not present

## 2021-03-11 DIAGNOSIS — K5909 Other constipation: Secondary | ICD-10-CM | POA: Diagnosis not present

## 2021-03-11 DIAGNOSIS — C641 Malignant neoplasm of right kidney, except renal pelvis: Secondary | ICD-10-CM | POA: Diagnosis not present

## 2021-03-11 DIAGNOSIS — E785 Hyperlipidemia, unspecified: Secondary | ICD-10-CM | POA: Diagnosis not present

## 2021-03-11 DIAGNOSIS — Z5111 Encounter for antineoplastic chemotherapy: Secondary | ICD-10-CM | POA: Diagnosis not present

## 2021-03-11 NOTE — Unmapped (Signed)
Converted to IP

## 2021-03-11 NOTE — Unmapped (Signed)
Hematology/Oncology     Attending Physician :  Rolanda Lundborg, MD  Accepting Service  : Oncology/Hematology (MDE)  Reason for Admission: Scheduled chemotherapy    Problem List:   Patient Active Problem List   Diagnosis   ??? Liposarcoma (CMS-HCC)        Assessment/Plan: Jeremy Faulkner is an 65 y.o. male with a PMHx of hyperlipidemia who was admitted for scheduled chemotherapy.    De-differentiated Liposarcoma: Followed by Dr. Dorna Mai at [facility]. De-differentiated liposarcoma of right kidney, localized disease only. S/p right nephrectomy June 2022.  Recent ECHO with LVEF 55-60%. Port placed 08/29. Patient presents for cycle one of AIM. Doxorubicin dosed 75mg /m2 IV x1 push on day one per Dr. Mechele Claude Consensus. Ifosfamide dose reduced to 2mg /m2 for elevated serum creatinine/one kidney.   - COVID pcr negative  - D1=8/31  ??  Chemotherapy  Cycle 1 Sarcoma AIM    Primary Oncologist Dr. Dorna Mai  ??  Date?? 9/2  ?? ?? ?? ?? ??   Day  1  2  3  4   5??   Doxorubicin 75mg /m2 x    ?? ?? ??   Ifosfamide2.0g/m2 x  x  x?? x ??   Mesna 800mg /m2 x x x x ??   ?? ?? ?? ?? ?? ??         ??Anti-emetics: Decadron D1-4; Ondansetron D1-4; Aprepitant D1-4; Zyprexa HS D1-3 ??  ??  Dispo:   - Follow in with Dr Dorna Mai on 9/20 at [facility]  - 9/5 GCSF appt at [facility], plan for self injection subsequent cycles if authorized by insurance.   ??  HLD: On Rosuvastatin 20mg  every day at home.   - Held on admission    Acute Kidney Injury: Patient with worsening Serum Creatinine since 6/25 in setting of right sided nephrectomy secondary to liposarcoma. Serum Creatinine 1.65 on admission. Ifosphamide dose reduced.    - Avoid other nephrotoxic medications     Impending Electrolyte Abnormality Secondary to Chemotherapy and/or IV Fluids  -Daily Electrolyte monitoring  -Replete per [facility] guidelines.??  ??  Impending Pancytopenia secondary to chemotherapy:   - Transfuse 1 unit of pRBCs for hgb >7  - Transfuse 1 unit plt for plts >10K     HPI: Jeremy Faulkner is an 65 y.o. male who presented for scheduled chemotherapy for his liposarcoma. Pt states that for roughly 1 year, he had intermittent episodes of abdominal/right flank pain and nausea which would last 1 day and resolve with multiple months between episodes. However, in May noted persistent abdominal pain which he thought was secondary to a pulled muscle. He went to PCP and underwent CT scan which revealed the renal mass. Pt underwent surgery and presents today for adjuvant chemotherapy.      Pt does endorse a recent ER visit in July which happened a couple of days after his abdominal surgery. Pt states he had two syncopal episodes and some mild low O2 which were worked up thoroughly in the ER without any attributable cause (per pt). Pt was not admitted and went home that evening without issue. Otherwise, has not had any repeat syncopal episodes.    Otherwise, denies any fevers, weight loss, night sweats, sick contacts, or travel outside the country. Denies any skin changes/lesions/bruises, HA, vision changes, congestion, epistaxis, rhinorrhea, oral lesions or dental work, neck stiffness or LAD, CP, SOB, abdominal pain, constipation, diarrhea, melena, dysuria, or hematuria.      Review of Systems: All positive and pertinent negatives are noted in the HPI;  otherwise all other systems are negative    Oncologic History:   Primary Oncologist: Dr. Dorna Mai  Oncology History   Liposarcoma (CMS-HCC)   03/06/2021 Initial Diagnosis    Liposarcoma (CMS-HCC)     03/11/2021 -  Chemotherapy    IP/OP SARCOMA DOXORUBICIN/IFOSFAMIDE/MESNA (AIM) (OP PEGFILGRASTIM)  DOXOrubicin 25 mg/m2 IV for 3 days and ifosfamide 2.5 g/m2 IV for 4 days, every 21 days. NOTE: On cycles 5 & 6, the addition of dexrazoxane 250 mg/m2 and DOXOrubicin changes from CIVI to bolus.         Medical History:  PCP: ERIC Gelene Mink, MD  No past medical history on file. Surgical History:  Past Surgical History:   Procedure Laterality Date   ??? IR INSERT PORT AGE GREATER THAN 5 YRS 03/09/2021    IR INSERT PORT AGE GREATER THAN 5 YRS 03/09/2021 Trude Mcburney, MD IMG VIR [facility]      Social History:  Social History     Socioeconomic History   ??? Marital status: Married   Tobacco Use   ??? Smoking status: Never Smoker   ??? Smokeless tobacco: Never Used      Family History:   family history is not on file.     Allergies: has No Known Allergies.    Medications:   Meds:  ??? sodium chloride  10 mL Intravenous BID   ??? sodium chloride  10 mL Intravenous BID     Continuous Infusions:  ??? sodium chloride       PRN Meds:.loperamide, loperamide    Objective:   Vitals: Temp:  [36.7 ??C (98.1 ??F)] 36.7 ??C (98.1 ??F)  Heart Rate:  [80] 80  Resp:  [16] 16  BP: (144)/(72) 144/72  BMI (Calculated):  [25.31] 25.31    Physical Exam:  General: Sitting up in chair, non-toxic. Well appearing.   HEENT:  PERRL. No scleral icterus or conjunctival injection. MMM without ulceration, erythema or exudate. No cervical or axillary lymphadenopathy.   Heart:  RRR. S1, S2. No murmurs, gallops, or rubs.  Lungs:  Breathing is unlabored, and patient is speaking full sentences with ease.  No stridor.  CTAB. No rales, ronchi, or crackles.    Abdomen:  No distention or pain on palpation.  Bowel sounds are present and normoactive x 4.  No palpable hepatomegaly or splenomegaly.  No palpable masses.  Skin:  No rashes, petechiae or purpura.  No areas of skin breakdown. Warm to touch, dry, smooth, and even. Linear vertical scar present on abdomen. Well healed, non-tender.   Musculoskeletal:  No grossly-evident joint effusions or deformities.  Range of motion about the shoulder, elbow, hips and knees is grossly normal.  Psychiatric:  Range of affect is appropriate.    Neurologic:  Alert and oriented to person, place, time and situation.  Gait is normal.  No Nystagmus.  CNII-CNXII grossly intact. Normal strength in all extremities.   Extremities:  Appear well-perfused. No clubbing, edema, or cyanosis.  CVAD: R CW Port - no erythema, nontender; dressing CDI.      Test Results  Recent Labs     03/10/21  0937   WBC 6.5   NEUTROABS 3.6   HGB 13.9   PLT 141*     Recent Labs     03/10/21  0937   NA 142   K 5.0*   CL 110*   CO2 30.0   BUN 18   CREATININE 1.65*   CALCIUM 9.0       Imaging: None  DVT PPX Indicated: no, low risk  FEN:  Discharge Plan:  - fluids: no  - electrolytes: stable   - diet: regular     Need for PT: no  Anticipated Discharge: Their home    Code Status: @PATFPLSTATCODE @   Full Code  Confirmed on admission    Time spent on counseling/coordination of care: 30 Minutes  Total time spent with patient: 1 Hour 30 Minutes    Rhodia Albright PA-C  Physician Assistant  Hematology/Oncology  [facility]    Group Pager: [phone]

## 2021-03-12 LAB — CBC W/ AUTO DIFF
BASOPHILS ABSOLUTE COUNT: 0 10*9/L (ref 0.0–0.1)
BASOPHILS ABSOLUTE COUNT: 0 10*9/L (ref 0.0–0.1)
BASOPHILS RELATIVE PERCENT: 0.6 %
BASOPHILS RELATIVE PERCENT: 0.7 %
EOSINOPHILS ABSOLUTE COUNT: 0 10*9/L (ref 0.0–0.5)
EOSINOPHILS ABSOLUTE COUNT: 0 10*9/L (ref 0.0–0.5)
EOSINOPHILS RELATIVE PERCENT: 0.1 %
EOSINOPHILS RELATIVE PERCENT: 0.1 %
HEMATOCRIT: 36.5 % — ABNORMAL LOW (ref 39.0–48.0)
HEMATOCRIT: 36.5 % — ABNORMAL LOW (ref 39.0–48.0)
HEMOGLOBIN: 12.8 g/dL — ABNORMAL LOW (ref 12.9–16.5)
HEMOGLOBIN: 12.8 g/dL — ABNORMAL LOW (ref 12.9–16.5)
LYMPHOCYTES ABSOLUTE COUNT: 0.6 10*9/L — ABNORMAL LOW (ref 1.1–3.6)
LYMPHOCYTES ABSOLUTE COUNT: 0.7 10*9/L — ABNORMAL LOW (ref 1.1–3.6)
LYMPHOCYTES RELATIVE PERCENT: 11.8 %
LYMPHOCYTES RELATIVE PERCENT: 12.8 %
MEAN CORPUSCULAR HEMOGLOBIN CONC: 34.9 g/dL (ref 32.0–36.0)
MEAN CORPUSCULAR HEMOGLOBIN CONC: 35.2 g/dL (ref 32.0–36.0)
MEAN CORPUSCULAR HEMOGLOBIN: 29 pg (ref 25.9–32.4)
MEAN CORPUSCULAR HEMOGLOBIN: 29.1 pg (ref 25.9–32.4)
MEAN CORPUSCULAR VOLUME: 82.6 fL (ref 77.6–95.7)
MEAN CORPUSCULAR VOLUME: 83.1 fL (ref 77.6–95.7)
MEAN PLATELET VOLUME: 7.5 fL (ref 6.8–10.7)
MEAN PLATELET VOLUME: 7.6 fL (ref 6.8–10.7)
MONOCYTES ABSOLUTE COUNT: 0.1 10*9/L — ABNORMAL LOW (ref 0.3–0.8)
MONOCYTES ABSOLUTE COUNT: 0.1 10*9/L — ABNORMAL LOW (ref 0.3–0.8)
MONOCYTES RELATIVE PERCENT: 1.1 %
MONOCYTES RELATIVE PERCENT: 1.5 %
NEUTROPHILS ABSOLUTE COUNT: 4.4 10*9/L (ref 1.8–7.8)
NEUTROPHILS ABSOLUTE COUNT: 4.4 10*9/L (ref 1.8–7.8)
NEUTROPHILS RELATIVE PERCENT: 85 %
NEUTROPHILS RELATIVE PERCENT: 86.3 %
PLATELET COUNT: 111 10*9/L — ABNORMAL LOW (ref 150–450)
PLATELET COUNT: 112 10*9/L — ABNORMAL LOW (ref 150–450)
RED BLOOD CELL COUNT: 4.4 10*12/L (ref 4.26–5.60)
RED BLOOD CELL COUNT: 4.41 10*12/L (ref 4.26–5.60)
RED CELL DISTRIBUTION WIDTH: 15.6 % — ABNORMAL HIGH (ref 12.2–15.2)
RED CELL DISTRIBUTION WIDTH: 15.9 % — ABNORMAL HIGH (ref 12.2–15.2)
WBC ADJUSTED: 5.1 10*9/L (ref 3.6–11.2)
WBC ADJUSTED: 5.2 10*9/L (ref 3.6–11.2)

## 2021-03-12 LAB — PHOSPHORUS: PHOSPHORUS: 1.5 mg/dL — ABNORMAL LOW (ref 2.4–5.1)

## 2021-03-12 LAB — URINALYSIS
BILIRUBIN UA: NEGATIVE
KETONES UA: 150 — AB
NITRITE UA: NEGATIVE
PH UA: 5.5 (ref 5.0–9.0)
PROTEIN UA: NEGATIVE
RBC UA: <1 /HPF (ref <=3)
SPECIFIC GRAVITY UA: 1.015 (ref 1.003–1.030)
SQUAMOUS EPITHELIAL: <1 /HPF (ref 0–5)
UROBILINOGEN UA: <2

## 2021-03-12 LAB — COMPREHENSIVE METABOLIC PANEL
ALBUMIN: 3.2 g/dL — ABNORMAL LOW (ref 3.4–5.0)
ALKALINE PHOSPHATASE: 45 U/L — ABNORMAL LOW (ref 46–116)
ALT (SGPT): 36 U/L (ref 10–49)
ANION GAP: 5 mmol/L (ref 5–14)
AST (SGOT): 27 U/L (ref <=34)
BILIRUBIN TOTAL: 0.6 mg/dL (ref 0.3–1.2)
BLOOD UREA NITROGEN: 20 mg/dL (ref 9–23)
BUN / CREAT RATIO: 13
CALCIUM: 8.4 mg/dL — ABNORMAL LOW (ref 8.7–10.4)
CHLORIDE: 110 mmol/L — ABNORMAL HIGH (ref 98–107)
CO2: 24 mmol/L (ref 20.0–31.0)
CREATININE: 1.55 mg/dL — ABNORMAL HIGH
EGFR CKD-EPI (2021) MALE: 49 mL/min/{1.73_m2} — ABNORMAL LOW (ref >=60)
GLUCOSE RANDOM: 125 mg/dL (ref 70–179)
POTASSIUM: 4.8 mmol/L (ref 3.4–4.8)
PROTEIN TOTAL: 5.4 g/dL — ABNORMAL LOW (ref 5.7–8.2)
SODIUM: 139 mmol/L (ref 135–145)

## 2021-03-12 LAB — BASIC METABOLIC PANEL
ANION GAP: 5 mmol/L (ref 5–14)
BLOOD UREA NITROGEN: 20 mg/dL (ref 9–23)
BUN / CREAT RATIO: 13
CALCIUM: 8.4 mg/dL — ABNORMAL LOW (ref 8.7–10.4)
CHLORIDE: 110 mmol/L — ABNORMAL HIGH (ref 98–107)
CO2: 24 mmol/L (ref 20.0–31.0)
CREATININE: 1.55 mg/dL — ABNORMAL HIGH
EGFR CKD-EPI (2021) MALE: 49 mL/min/{1.73_m2} — ABNORMAL LOW (ref >=60)
GLUCOSE RANDOM: 125 mg/dL (ref 70–179)
POTASSIUM: 4.8 mmol/L (ref 3.4–4.8)
SODIUM: 139 mmol/L (ref 135–145)

## 2021-03-12 LAB — MAGNESIUM: MAGNESIUM: 1.7 mg/dL (ref 1.6–2.6)

## 2021-03-12 MED ADMIN — mesna (MESNEX) 1,421 mg in sodium chloride (NS) 0.9 % 50 mL IVPB: 667 mg/m2 | INTRAVENOUS | @ 08:00:00 | Stop: 2021-03-15

## 2021-03-12 MED ADMIN — dexAMETHasone (DECADRON) tablet 12 mg: 12 mg | ORAL | @ 22:00:00 | Stop: 2021-03-14

## 2021-03-12 MED ADMIN — mesna (MESNEX) 1,421 mg in sodium chloride (NS) 0.9 % 50 mL IVPB: 667 mg/m2 | INTRAVENOUS | @ 05:00:00 | Stop: 2021-03-15

## 2021-03-12 MED ADMIN — aprepitant (EMEND) capsule 80 mg: 80 mg | ORAL | @ 22:00:00 | Stop: 2021-03-14

## 2021-03-12 MED ADMIN — sodium chloride (NS) 0.9 % flush 10 mL: 10 mL | INTRAVENOUS | @ 13:00:00

## 2021-03-12 MED ADMIN — sodium chloride (NS) 0.9 % flush 10 mL: 10 mL | INTRAVENOUS | @ 14:00:00

## 2021-03-12 MED ADMIN — mesna (MESNEX) 1,421 mg in sodium chloride (NS) 0.9 % 50 mL IVPB: 667 mg/m2 | INTRAVENOUS | @ 22:00:00 | Stop: 2021-03-14

## 2021-03-12 MED ADMIN — sodium chloride (NS) 0.9 % infusion: 125 mL/h | INTRAVENOUS | @ 22:00:00

## 2021-03-12 MED ADMIN — docusate sodium (COLACE) capsule 100 mg: 100 mg | ORAL | @ 17:00:00

## 2021-03-12 MED ADMIN — ondansetron (ZOFRAN) tablet 24 mg: 24 mg | ORAL | @ 22:00:00 | Stop: 2021-03-14

## 2021-03-12 MED ADMIN — sodium chloride (NS) 0.9 % infusion: 125 mL/h | INTRAVENOUS | @ 08:00:00

## 2021-03-12 MED ADMIN — sodium phosphate 18 mmol in dextrose 5 % 250 mL IVPB: 18 mmol | INTRAVENOUS | @ 17:00:00 | Stop: 2021-03-12

## 2021-03-12 MED ADMIN — ifosfamide (IFEX) 4.26 g in sodium chloride (NS) 0.9 % 500 mL IVPB: 2 g/m2 | INTRAVENOUS | @ 23:00:00 | Stop: 2021-03-14

## 2021-03-12 NOTE — Unmapped (Signed)
Daily Progress Note      Active Problems:    * No active hospital problems. *       LOS: 1 day     Assessment/Plan: Jeremy Faulkner is an 65 y.o. male with a PMHx of hyperlipidemia??who was admitted for??scheduled chemotherapy for liposarcoma.     De-differentiated Liposarcoma: Followed by Dr. Dorna Mai at [facility]. De-differentiated liposarcoma of right kidney, localized disease only. S/p right nephrectomy June 2022.  Recent ECHO with LVEF 55-60%. Port placed 08/29. Patient presents for cycle one of AIM. Doxorubicin dosed 75mg /m2 IV x1 bolus on day one per Dr. Mechele Claude Consensus to avoid interruptions with administration. Ifosfamide dose reduced to 2mg /m2 and decreased to 3 day regimen given elevated serum creatinine/single kidney as well as to prevent prolonged neutropenia.   - COVID pcr negative  - D1=8/31  ??  Chemotherapy  Cycle 1 Sarcoma AIM    Primary Oncologist Dr. Dorna Mai  ??  Date?? 8/31  ?? ?? ?? ?? ??   Day  1  2  3  4   5??   Doxorubicin 75mg /m2 x    ?? ?? ??   Ifosfamide2.0g/m2 x  x  x??  ??   Mesna 800mg /m2 x x x  ??   ?? ?? ?? ?? ?? ??         ??Anti-emetics: Decadron D1-4; Ondansetron D1-4; Aprepitant D1-4; Zyprexa HS D1-3 ??  ??  Dispo:   - Follow in with Dr Dorna Mai on 9/20 at [facility]  - Will need 3 day Zyprexa and PRN Compazine and Zofan on discharge.   - 9/5 GCSF appt at [facility], plan for self injection subsequent cycles if authorized by insurance.   ??  HLD: On Rosuvastatin 20mg  every day at home.   - Held on admission    Acute Kidney Injury: Patient with worsening Serum Creatinine since 6/25 in setting of right sided nephrectomy secondary to liposarcoma. Serum Creatinine 1.65 on admission. Ifosphamide dose reduced.    - Avoid other nephrotoxic medications     Chronic Constipation: Often goes 3-4 days without a bowel movement for multiple years. States he was recently on colace after his surgery with good improvement in bowel function.   - Colace PO QD    Impending Electrolyte Abnormality Secondary to Chemotherapy and/or IV Fluids  - Daily Electrolyte monitoring  - Replete per [facility] guidelines.??  ??  Impending Pancytopenia secondary to chemotherapy:   - Transfuse 1 unit of pRBCs for hgb >7  - Transfuse 1 unit plt for plts >10K     Nutrition:                        Subjective:     Interval History: Feeling well this morning. States he was having some fullness in his abdomen similar to his history of constipation, last BM 8/30. Otherwise, slept well and denies any new HA, vision changes, CP, SOB, or abdominal pain. Tolerating chemotherapy well.    10 point ROS otherwise negative except as above in the HPI.     Objective:     Vital signs in last 24 hours:  Temp:  [36.1 ??C (96.9 ??F)-36.8 ??C (98.2 ??F)] 36.8 ??C (98.2 ??F)  Heart Rate:  [60-67] 64  Resp:  [16] 16  BP: (100-138)/(60-74) 118/63  MAP (mmHg):  [71-74] 71  SpO2:  [97 %] 97 %    Intake/Output last 3 shifts:  I/O last 3 completed shifts:  In: 20 [I.V.:20]  Out: 1550 [Urine:1550]  Meds:  Current Facility-Administered Medications   Medication Dose Route Frequency Provider Last Rate Last Admin   ??? aprepitant (EMEND) capsule 80 mg  80 mg Oral Q24H Norm Parcel, MD       ??? CHEMO CLARIFICATION ORDER   Other Continuous PRN Mariana Kaufman, PA       ??? CHEMO CLARIFICATION ORDER   Other Continuous PRN Sol Passer [name], NP       ??? dexAMETHasone (DECADRON) tablet 12 mg  12 mg Oral Q24H Norm Parcel, MD   12 mg at 03/11/21 1611   ??? diphenhydrAMINE (BENADRYL) injection 25 mg  25 mg Intravenous Q4H PRN Norm Parcel, MD       ??? docusate sodium (COLACE) capsule 100 mg  100 mg Oral Daily Bettyjean Stefanski Donald [place], [place]   100 mg at 03/12/21 1237   ??? EPINEPHrine (EPIPEN) injection 0.3 mg  0.3 mg Intramuscular Daily PRN Norm Parcel, MD       ??? famotidine (PF) (PEPCID) injection 20 mg  20 mg Intravenous Q4H PRN Norm Parcel, MD       ??? ifosfamide (IFEX) 4.26 g in sodium chloride (NS) 0.9 % 500 mL IVPB  2 g/m2 (Treatment Plan Recorded) Intravenous Q24H Norm Parcel, MD   Stopped at 03/11/21 2035   ??? IP OKAY TO TREAT   Other Continuous PRN Mariana Kaufman, PA       ??? loperamide (IMODIUM) capsule 2 mg  2 mg Oral Q2H PRN Mariana Kaufman, PA       ??? loperamide (IMODIUM) capsule 4 mg  4 mg Oral Once PRN Mariana Kaufman, PA       ??? meperidine (DEMEROL) injection 25 mg  25 mg Intravenous Q30 Min PRN Norm Parcel, MD       ??? mesna (MESNEX) 1,421 mg in sodium chloride (NS) 0.9 % 50 mL IVPB  667 mg/m2 (Treatment Plan Recorded) Intravenous Q24H Norm Parcel, MD   Stopped at 03/11/21 1815   ??? mesna (MESNEX) 1,421 mg in sodium chloride (NS) 0.9 % 50 mL IVPB  667 mg/m2 (Treatment Plan Recorded) Intravenous Q24H Norm Parcel, MD   Stopped at 03/12/21 [phone]   ??? mesna (MESNEX) 1,421 mg in sodium chloride (NS) 0.9 % 50 mL IVPB  667 mg/m2 (Treatment Plan Recorded) Intravenous Q24H Norm Parcel, MD   Stopped at 03/12/21 0441   ??? methylPREDNISolone sodium succinate (PF) (Solu-MEDROL) injection 125 mg  125 mg Intravenous Q4H PRN Norm Parcel, MD       ??? OLANZapine (ZyPREXA) tablet 5 mg  5 mg Oral Nightly Norm Parcel, MD       ??? ondansetron (ZOFRAN) tablet 24 mg  24 mg Oral Q24H Norm Parcel, MD   24 mg at 03/11/21 1611   ??? prochlorperazine (COMPAZINE) injection 10 mg  10 mg Intravenous Q6H PRN Norm Parcel, MD       ??? prochlorperazine (COMPAZINE) tablet 10 mg  10 mg Oral Q6H PRN Norm Parcel, MD       ??? sodium chloride (NS) 0.9 % flush 10 mL  10 mL Intravenous BID Mariana Kaufman, PA   10 mL at 03/12/21 0900   ??? sodium chloride (NS) 0.9 % flush 10 mL  10 mL Intravenous BID Mariana Kaufman, PA   10 mL at 03/12/21 1000   ??? sodium chloride (NS) 0.9 % infusion  20 mL/hr Intravenous Continuous Katheen Aslin Dana Allan, PA       ??? sodium chloride (NS) 0.9 % infusion  125 mL/hr Intravenous Continuous Norm Parcel, MD 125 mL/hr at 03/12/21 0419 125 mL/hr at 03/12/21 0419   ??? sodium chloride (NS) 0.9 % infusion  20 mL/hr Intravenous Continuous PRN Norm Parcel, MD ??? sodium chloride 0.9% (NS) bolus 1,000 mL  1,000 mL Intravenous Daily PRN Norm Parcel, MD       ??? sodium phosphate 18 mmol in dextrose 5 % 250 mL IVPB  18 mmol Intravenous Once [employer], PA 46.8 mL/hr at 03/12/21 1237 18 mmol at 03/12/21 1237       Physical Exam:  General: Resting, in no apparent distress, sitting up comfortably in chair.  HEENT:  PERRL. No scleral icterus or conjunctival injection. MMM without ulceration, erythema or exudate. No cervical or axillary lymphadenopathy.   Heart:  RRR. S1, S2. No murmurs, gallops, or rubs.  Lungs:  Normal effort of breathing, speaking full sentences with ease. CTAB. No rales, ronchi, or crackles.    Abdomen:  No distention or pain on palpation.  Bowel sounds are present and normoactive x 4.  No palpable hepatomegaly or splenomegaly.  No palpable masses.  Skin:  No rashes, petechiae or purpura.  No areas of skin breakdown. Warm to touch, dry, smooth, and even. Linear vertical scar present over abdomen.   Musculoskeletal:  No grossly-evident joint effusions or deformities.  Range of motion about the shoulder, elbow, hips and knees is grossly normal.  Psychiatric:  Range of affect is appropriate.    Neurologic:  Alert and oriented to person, place, time and situation.  Gait is normal.  No Nystagmus. CNII-CNXII grossly intact.  Extremities:  Appear well-perfused. No clubbing, edema, or cyanosis.  CVAD: R CW Port - no erythema, nontender; dressing CDI.      Labs:  Recent Labs     03/10/21  0937 03/12/21  0427   WBC 6.5 5.1 - 5.2   NEUTROABS 3.6 4.4 - 4.4   LYMPHSABS 1.8 0.6* - 0.7*   HGB 13.9 12.8* - 12.8*   HCT 40.4 36.5* - 36.5*   PLT 141* 112* - 111*   CREATININE 1.65* 1.55* - 1.55*   BUN 18 20 - 20   BILITOT 0.8 0.6   AST 38* 27   ALT 42 36   ALKPHOS 54 45*   K 5.0* 4.8 - 4.8   MG  --  1.7   CALCIUM 9.0 8.4* - 8.4*   NA 142 139 - 139   CL 110* 110* - 110*   CO2 30.0 24.0 - 24.0   PHOS  --  1.5*       Imaging:  No new.    Mariana Kaufman, PA-C  03/12/2021  1:09 PM   Hematology/Oncology Department   [facility]   Group pager: [phone]

## 2021-03-12 NOTE — Unmapped (Signed)
[facility] Specialty Medication Onboarding    Specialty Medication: Ziextenzo 6mg /0.73mL  Prior Authorization: Approved   Financial Assistance: No - patient declined financial assistance per MAPs referral  Final Copay/Day Supply: $105.93 / 21    Insurance Restrictions: None     Notes to Pharmacist:     The triage team has completed the benefits investigation and has determined that the patient is able to fill this medication at [facility]. Please contact the patient to complete the onboarding or follow up with the prescribing physician as needed.

## 2021-03-12 NOTE — Unmapped (Signed)
Pt alert and oriented. VSS, no falls and afebrile.  No acute events overnight. Pt denies pain.  Pt tolerated chemotherapy well overnight.  Pt remains on room air with good results.  Safety measures remain in place with wife at bedside.  Will continue to monitor.       Vitals:    03/11/21 1312 03/11/21 1950 03/12/21 0035 03/12/21 0440   BP: 138/74 102/60 101/62 100/60   Pulse: 67 67 60 60   Resp: 16 16 16 16    Temp: 36.1 ??C (96.9 ??F) 36.6 ??C (97.9 ??F) 36.5 ??C (97.7 ??F) 36.7 ??C (98.1 ??F)   TempSrc: Oral Oral Oral Oral   SpO2:   97% 97%   Weight:       Height:         Problem: Adult Inpatient Plan of Care  Goal: Plan of Care Review  03/12/2021 0129 by Weber Cooks, RN  Outcome: Progressing  Flowsheets (Taken 03/12/2021 0129)  Progress: improving  Plan of Care Reviewed With:  ??? patient  ??? spouse  03/12/2021 0129 by Weber Cooks, RN  Outcome: Progressing  Flowsheets (Taken 03/12/2021 0129)  Progress: improving  Plan of Care Reviewed With:  ??? patient  ??? spouse  Goal: Patient-Specific Goal (Individualized)  03/12/2021 0129 by Weber Cooks, RN  Outcome: Progressing  Flowsheets (Taken 03/12/2021 0129)  Patient-Specific Goals (Include Timeframe): Pt would like care clustered so he can get some rest overnight.  Individualized Care Needs: Cluster care, empathetic listening, presence, provide medications as needed per [facility].  Anxieties, Fears or Concerns: None noted at this time.  03/12/2021 0129 by Weber Cooks, RN  Outcome: Progressing  Flowsheets (Taken 03/12/2021 0129)  Patient-Specific Goals (Include Timeframe): Pt would like care clustered so he can get some rest overnight.  Individualized Care Needs: Cluster care, empathetic listening, presence, provide medications as needed per [facility].  Anxieties, Fears or Concerns: None noted at this time.  Goal: Absence of Hospital-Acquired Illness or Injury  03/12/2021 0129 by Weber Cooks, RN  Outcome: Progressing  03/12/2021 0129 by Weber Cooks, RN  Outcome: Progressing  Intervention: Identify and Manage Fall Risk  Recent Flowsheet Documentation  Taken 03/11/2021 2035 by Weber Cooks, RN  Safety Interventions:  ??? bleeding precautions  ??? chemotherapeutic agent precautions  ??? commode/urinal/bedpan at bedside  ??? environmental modification  ??? fall reduction program maintained  ??? family at bedside  ??? lighting adjusted for tasks/safety  ??? low bed  ??? nonskid shoes/slippers when out of bed  Intervention: Prevent Skin Injury  Recent Flowsheet Documentation  Taken 03/11/2021 2035 by Weber Cooks, RN  Skin Protection:  ??? adhesive use limited  ??? protective footwear used  ??? transparent dressing maintained  ??? tubing/devices free from skin contact  Intervention: Prevent and Manage VTE (Venous Thromboembolism) Risk  Recent Flowsheet Documentation  Taken 03/11/2021 2035 by Weber Cooks, RN  VTE Prevention/Management:  ??? ambulation promoted  ??? bleeding precautions maintained  ??? bleeding risk factors identified  ??? dorsiflexion/plantar flexion performed  ??? fluids promoted  ??? intravenous hydration  Goal: Optimal Comfort and Wellbeing  03/12/2021 0129 by Weber Cooks, RN  Outcome: Progressing  03/12/2021 0129 by Weber Cooks, RN  Outcome: Progressing  Goal: Readiness for Transition of Care  03/12/2021 0129 by Weber Cooks, RN  Outcome: Progressing  03/12/2021 0129 by Weber Cooks, RN  Outcome: Progressing  Goal: Rounds/Family Conference  03/12/2021 0129 by Weber Cooks, RN  Outcome: Progressing  03/12/2021 0129 by Weber Cooks, RN  Outcome: Progressing     Problem: Anemia (Chemotherapy Effects)  Goal: Anemia Symptom Improvement  03/12/2021 0129 by Weber Cooks, RN  Outcome: Progressing  03/12/2021 0129 by Weber Cooks, RN  Outcome: Progressing  Intervention: Monitor and Manage Anemia  Recent Flowsheet Documentation  Taken 03/11/2021 2035 by Weber Cooks, RN  Safety Interventions:  ??? bleeding precautions  ??? chemotherapeutic agent precautions  ??? commode/urinal/bedpan at bedside  ??? environmental modification  ??? fall reduction program maintained  ??? family at bedside  ??? lighting adjusted for tasks/safety  ??? low bed  ??? nonskid shoes/slippers when out of bed  Fatigue Management:  ??? activity schedule adjusted  ??? fatigue-related activity identified  ??? frequent rest breaks encouraged  ??? paced activity encouraged     Problem: Nausea and Vomiting (Chemotherapy Effects)  Goal: Fluid and Electrolyte Balance  03/12/2021 0129 by Weber Cooks, RN  Outcome: Progressing  03/12/2021 0129 by Weber Cooks, RN  Outcome: Progressing     Problem: Neurotoxicity (Chemotherapy Effects)  Goal: Neurotoxicity Symptom Control  03/12/2021 0129 by Weber Cooks, RN  Outcome: Progressing  03/12/2021 0129 by Weber Cooks, RN  Outcome: Progressing     Problem: Neutropenia (Chemotherapy Effects)  Goal: Absence of Infection  03/12/2021 0129 by Weber Cooks, RN  Outcome: Progressing  03/12/2021 0129 by Weber Cooks, RN  Outcome: Progressing     Problem: Oral Mucositis (Chemotherapy Effects)  Goal: Improved Oral Mucous Membrane Integrity  03/12/2021 0129 by Weber Cooks, RN  Outcome: Progressing  03/12/2021 0129 by Weber Cooks, RN  Outcome: Progressing  Intervention: Promote Oral Comfort and Health  Recent Flowsheet Documentation  Taken 03/11/2021 2035 by Weber Cooks, RN  Oral Mucous Membrane Protection:  ??? nonirritating oral fluids promoted  ??? nonirritating oral foods promoted     Problem: Thrombocytopenia Bleeding Risk (Chemotherapy Effects)  Goal: Absence of Bleeding  03/12/2021 0129 by Weber Cooks, RN  Outcome: Progressing  03/12/2021 0129 by Weber Cooks, RN  Outcome: Progressing  Intervention: Minimize Bleeding Risk  Recent Flowsheet Documentation  Taken 03/11/2021 2035 by Weber Cooks, RN  Bleeding Precautions:  ??? blood pressure closely monitored  ??? coagulation study results reviewed  ??? foot protection facilitated  ??? gentle oral care promoted  ??? monitored for signs of bleeding     Problem: Fall Injury Risk  Goal: Absence of Fall and Fall-Related Injury  Outcome: Resolved  Intervention: Identify and Manage Contributors  Recent Flowsheet Documentation  Taken 03/11/2021 2035 by Weber Cooks, RN  Self-Care Promotion:  ??? independence encouraged  ??? BADL personal objects within reach  ??? BADL personal routines maintained  Intervention: Promote Injury-Free Environment  Recent Flowsheet Documentation  Taken 03/11/2021 2035 by Weber Cooks, RN  Safety Interventions:  ??? bleeding precautions  ??? chemotherapeutic agent precautions  ??? commode/urinal/bedpan at bedside  ??? environmental modification  ??? fall reduction program maintained  ??? family at bedside  ??? lighting adjusted for tasks/safety  ??? low bed  ??? nonskid shoes/slippers when out of bed

## 2021-03-12 NOTE — Unmapped (Addendum)
Jeremy Faulkner is an 65 y.o. male with a PMHx of hyperlipidemia who was admitted for scheduled chemotherapy for liposarcoma. Pt tolerated AIM well without any complications. Will discharge with Zyprexa x3 days HS and PRN Compazine and Zofran for nausea prophylaxis. GCSF appointment scheduled at [facility] infusion clinic on 9/5. Follow up scheduled with Dr. Dorna Mai on 9/20 at [facility].     De-differentiated Liposarcoma: Followed by Dr. Dorna Mai at [facility]. De-differentiated liposarcoma of right kidney, localized disease only. S/p right nephrectomy June 2022.  Recent ECHO with LVEF 55-60%. Port placed 08/29. Patient presents for cycle one of AIM. Doxorubicin dosed 75mg /m2 IV x1 bolus on day one per Dr. Mechele Claude Consensus to avoid interruptions with administration. Ifosfamide dose reduced to 2mg /m2 and decreased to 3 day regimen given elevated serum creatinine/single kidney as well as to prevent prolonged neutropenia. Chemotherapy started on 8/31 and was tolerated well, without complication. COVID pcr negative on admission.     Dispo:   - Follow in with Dr Dorna Mai on 9/20 at [facility]  - Will need 3 day Zyprexa and PRN Compazine and Zofan on discharge.   - 9/5 GCSF appt at [facility], plan for self injection subsequent cycles if authorized by insurance.      HLD: On Rosuvastatin 20mg  every day at home. Held on admission but can restart on discharge.     Acute Kidney Injury: Patient with worsening Serum Creatinine since 6/25 in setting of right sided nephrectomy secondary to liposarcoma. Serum Creatinine 1.65 on admission. Ifosphamide dose reduced. Educated to avoid other nephrotoxic medications at home.     Chronic Constipation: Often goes 3-4 days without a bowel movement for multiple years. States he was recently on colace after his surgery with good improvement in bowel function. Continue colace every day.      Impending Pancytopenia secondary to chemotherapy:   - Transfuse 1 unit of pRBCs for hgb >7  - Transfuse 1 unit plt for plts >10K

## 2021-03-13 LAB — CBC W/ AUTO DIFF
BASOPHILS ABSOLUTE COUNT: 0 10*9/L (ref 0.0–0.1)
BASOPHILS RELATIVE PERCENT: 0.2 %
EOSINOPHILS ABSOLUTE COUNT: 0 10*9/L (ref 0.0–0.5)
EOSINOPHILS RELATIVE PERCENT: 0 %
HEMATOCRIT: 33.5 % — ABNORMAL LOW (ref 39.0–48.0)
HEMOGLOBIN: 11.9 g/dL — ABNORMAL LOW (ref 12.9–16.5)
LYMPHOCYTES ABSOLUTE COUNT: 0.6 10*9/L — ABNORMAL LOW (ref 1.1–3.6)
LYMPHOCYTES RELATIVE PERCENT: 6.5 %
MEAN CORPUSCULAR HEMOGLOBIN CONC: 35.4 g/dL (ref 32.0–36.0)
MEAN CORPUSCULAR HEMOGLOBIN: 29.5 pg (ref 25.9–32.4)
MEAN CORPUSCULAR VOLUME: 83.5 fL (ref 77.6–95.7)
MEAN PLATELET VOLUME: 7.9 fL (ref 6.8–10.7)
MONOCYTES ABSOLUTE COUNT: 0.5 10*9/L (ref 0.3–0.8)
MONOCYTES RELATIVE PERCENT: 5.4 %
NEUTROPHILS ABSOLUTE COUNT: 7.9 10*9/L — ABNORMAL HIGH (ref 1.8–7.8)
NEUTROPHILS RELATIVE PERCENT: 87.9 %
PLATELET COUNT: 115 10*9/L — ABNORMAL LOW (ref 150–450)
RED BLOOD CELL COUNT: 4.01 10*12/L — ABNORMAL LOW (ref 4.26–5.60)
RED CELL DISTRIBUTION WIDTH: 15.7 % — ABNORMAL HIGH (ref 12.2–15.2)
WBC ADJUSTED: 9 10*9/L (ref 3.6–11.2)

## 2021-03-13 LAB — BASIC METABOLIC PANEL
ANION GAP: 6 mmol/L (ref 5–14)
BLOOD UREA NITROGEN: 26 mg/dL — ABNORMAL HIGH (ref 9–23)
BUN / CREAT RATIO: 15
CALCIUM: 8.2 mg/dL — ABNORMAL LOW (ref 8.7–10.4)
CHLORIDE: 112 mmol/L — ABNORMAL HIGH (ref 98–107)
CO2: 22 mmol/L (ref 20.0–31.0)
CREATININE: 1.79 mg/dL — ABNORMAL HIGH
EGFR CKD-EPI (2021) MALE: 41 mL/min/{1.73_m2} — ABNORMAL LOW (ref >=60)
GLUCOSE RANDOM: 133 mg/dL (ref 70–179)
POTASSIUM: 4.5 mmol/L (ref 3.4–4.8)
SODIUM: 140 mmol/L (ref 135–145)

## 2021-03-13 LAB — URINALYSIS
BACTERIA: NONE SEEN /HPF
BILIRUBIN UA: NEGATIVE
BLOOD UA: NEGATIVE
GLUCOSE UA: NEGATIVE
KETONES UA: 150 — AB
LEUKOCYTE ESTERASE UA: NEGATIVE
NITRITE UA: NEGATIVE
PH UA: 6 (ref 5.0–9.0)
PROTEIN UA: NEGATIVE
RBC UA: <1 /HPF (ref <=3)
SPECIFIC GRAVITY UA: 1.009 (ref 1.003–1.030)
SQUAMOUS EPITHELIAL: <1 /HPF (ref 0–5)
UROBILINOGEN UA: <2
WBC UA: <1 /HPF (ref <=2)

## 2021-03-13 MED ADMIN — mesna (MESNEX) 1,421 mg in sodium chloride (NS) 0.9 % 50 mL IVPB: 667 mg/m2 | INTRAVENOUS | @ 05:00:00 | Stop: 2021-03-15

## 2021-03-13 MED ADMIN — mesna (MESNEX) 1,421 mg in sodium chloride (NS) 0.9 % 50 mL IVPB: 667 mg/m2 | INTRAVENOUS | @ 22:00:00 | Stop: 2021-03-13

## 2021-03-13 MED ADMIN — mesna (MESNEX) 1,421 mg in sodium chloride (NS) 0.9 % 50 mL IVPB: 667 mg/m2 | INTRAVENOUS | @ 09:00:00 | Stop: 2021-03-15

## 2021-03-13 MED ADMIN — baclofen (LIORESAL) tablet 5 mg: 5 mg | ORAL | @ 13:00:00 | Stop: 2021-03-13

## 2021-03-13 MED ADMIN — ifosfamide (IFEX) 4.26 g in sodium chloride (NS) 0.9 % 500 mL IVPB: 2 g/m2 | INTRAVENOUS | @ 22:00:00 | Stop: 2021-03-13

## 2021-03-13 MED ADMIN — sodium chloride (NS) 0.9 % flush 10 mL: 10 mL | INTRAVENOUS | @ 01:00:00

## 2021-03-13 MED ADMIN — dexAMETHasone (DECADRON) tablet 12 mg: 12 mg | ORAL | @ 21:00:00 | Stop: 2021-03-13

## 2021-03-13 MED ADMIN — ondansetron (ZOFRAN) tablet 24 mg: 24 mg | ORAL | @ 21:00:00 | Stop: 2021-03-13

## 2021-03-13 MED ADMIN — docusate sodium (COLACE) capsule 100 mg: 100 mg | ORAL | @ 13:00:00

## 2021-03-13 MED ADMIN — famotidine (PEPCID) tablet 20 mg: 20 mg | ORAL | @ 21:00:00 | Stop: 2021-03-13

## 2021-03-13 MED ADMIN — OLANZapine (ZyPREXA) tablet 5 mg: 5 mg | ORAL | @ 01:00:00 | Stop: 2021-03-14

## 2021-03-13 MED ADMIN — sodium chloride (NS) 0.9 % infusion: 125 mL/h | INTRAVENOUS | @ 05:00:00

## 2021-03-13 MED ADMIN — sodium chloride (NS) 0.9 % flush 10 mL: 10 mL | INTRAVENOUS | @ 13:00:00

## 2021-03-13 MED ADMIN — aprepitant (EMEND) capsule 80 mg: 80 mg | ORAL | @ 21:00:00 | Stop: 2021-03-13

## 2021-03-13 NOTE — Unmapped (Signed)
Daily Progress Note      Active Problems:    * No active hospital problems. *       LOS: 2 days     Assessment/Plan: Jeremy Faulkner is an 65 y.o. male with a PMHx of hyperlipidemia??who was admitted for??scheduled chemotherapy for liposarcoma.     Plan Summary: D3=9/2 AIM C1. Tolerating chemotherapy well. Continue Colace for chronic constipation. Can likely discharge tomorrow barring any complications. Other management, as below.     De-differentiated Liposarcoma: Followed by Dr. Dorna Mai at [facility]. De-differentiated liposarcoma of right kidney, localized disease only. S/p right nephrectomy June 2022.  Recent ECHO with LVEF 55-60%. Port placed 08/29. Patient presents for cycle one of AIM. Doxorubicin dosed 75mg /m2 IV x1 bolus on day one per Dr. Mechele Claude Consensus to avoid interruptions with administration. Ifosfamide dose reduced to 2mg /m2 and decreased to 3 day regimen given elevated serum creatinine/single kidney as well as to prevent prolonged neutropenia.   - COVID pcr negative  - D1=8/31  ??  Chemotherapy  Cycle 1 Sarcoma AIM    Primary Oncologist Dr. Dorna Mai  ??  Date?? 8/31  ?? ?? ?? ?? ??   Day  1  2  3  4   5??   Doxorubicin 75mg /m2 x    ?? ?? ??   Ifosfamide2.0g/m2 x  x  x??  ??   Mesna 800mg /m2 x x x  ??   ?? ?? ?? ?? ?? ??         ??Anti-emetics: Decadron D1-4; Ondansetron D1-4; Aprepitant D1-4; Zyprexa HS D1-3 ??  ??  Dispo:   - Follow in with Dr Dorna Mai on 9/20 at [facility]  - Will need 3 day Zyprexa and PRN Compazine and Zofan on discharge.   - 9/5 GCSF appt at [facility], plan for self injection subsequent cycles if authorized by insurance.   ??  HLD: On Rosuvastatin 20mg  every day at home.   - Held on admission    Acute Kidney Injury: Patient with worsening Serum Creatinine since 6/25 in setting of right sided nephrectomy secondary to liposarcoma. Serum Creatinine 1.65 on admission. Ifosphamide dose reduced.    - Avoid other nephrotoxic medications     Chronic Constipation: Often goes 3-4 days without a bowel movement for multiple years. States he was recently on colace after his surgery with good improvement in bowel function.   - Colace PO every day    Hiccups: New onset on 9/1 overnight. Endorses improvement with 1x prn Baclofen.  - Consider prn baclofen if symptoms return.     Impending Electrolyte Abnormality Secondary to Chemotherapy and/or IV Fluids  - Daily Electrolyte monitoring  - Replete per [facility] guidelines.??  ??  Impending Pancytopenia secondary to chemotherapy:   - Transfuse 1 unit of pRBCs for hgb >7  - Transfuse 1 unit plt for plts >10K     Nutrition:                        Subjective:     Interval History: Feeling well this morning. Slept well overnight and denies any new HA, vision changes, CP, SOB, or abdominal pain. Tolerating chemotherapy well. Ambulating well during admission. Discussed plan to discharge tomorrow. No other questions or concerns offered.     10 point ROS otherwise negative except as above in the HPI.     Objective:     Vital signs in last 24 hours:  Temp:  [36.3 ??C (97.3 ??F)-36.8 ??C (98.2 ??F)] 36.3 ??C (97.3 ??  F)  Heart Rate:  [48-68] 54  Resp:  [16-18] 18  BP: (105-135)/(61-68) 120/63  MAP (mmHg):  [74-88] 76  SpO2:  [97 %-98 %] 98 %    Intake/Output last 3 shifts:  I/O last 3 completed shifts:  In: 2060 [P.O.:2040; I.V.:20]  Out: 4150 [Urine:4150]    Meds:  Current Facility-Administered Medications   Medication Dose Route Frequency Provider Last Rate Last Admin   ??? aprepitant (EMEND) capsule 80 mg  80 mg Oral Q24H Norm Parcel, MD   80 mg at 03/12/21 1733   ??? CHEMO CLARIFICATION ORDER   Other Continuous PRN Mariana Kaufman, PA       ??? CHEMO CLARIFICATION ORDER   Other Continuous PRN Sol Passer [name], NP       ??? dexAMETHasone (DECADRON) tablet 12 mg  12 mg Oral Q24H Norm Parcel, MD   12 mg at 03/12/21 1733   ??? diphenhydrAMINE (BENADRYL) injection 25 mg  25 mg Intravenous Q4H PRN Norm Parcel, MD       ??? docusate sodium (COLACE) capsule 100 mg  100 mg Oral Daily Kenetra Hildenbrand Donald Eek, PA   100 mg at 03/13/21 0901   ??? EPINEPHrine (EPIPEN) injection 0.3 mg  0.3 mg Intramuscular Daily PRN Norm Parcel, MD       ??? famotidine (PF) (PEPCID) injection 20 mg  20 mg Intravenous Q4H PRN Norm Parcel, MD       ??? ifosfamide (IFEX) 4.26 g in sodium chloride (NS) 0.9 % 500 mL IVPB  2 g/m2 (Treatment Plan Recorded) Intravenous Q24H Norm Parcel, MD   Stopped at 03/12/21 2037   ??? IP OKAY TO TREAT   Other Continuous PRN Mariana Kaufman, PA       ??? loperamide (IMODIUM) capsule 2 mg  2 mg Oral Q2H PRN Mariana Kaufman, PA       ??? loperamide (IMODIUM) capsule 4 mg  4 mg Oral Once PRN Mariana Kaufman, PA       ??? meperidine (DEMEROL) injection 25 mg  25 mg Intravenous Q30 Min PRN Norm Parcel, MD       ??? mesna (MESNEX) 1,421 mg in sodium chloride (NS) 0.9 % 50 mL IVPB  667 mg/m2 (Treatment Plan Recorded) Intravenous Q24H Norm Parcel, MD   Stopped at 03/12/21 1824   ??? mesna (MESNEX) 1,421 mg in sodium chloride (NS) 0.9 % 50 mL IVPB  667 mg/m2 (Treatment Plan Recorded) Intravenous Q24H Norm Parcel, MD   Stopped at 03/13/21 [phone]   ??? mesna (MESNEX) 1,421 mg in sodium chloride (NS) 0.9 % 50 mL IVPB  667 mg/m2 (Treatment Plan Recorded) Intravenous Q24H Norm Parcel, MD   Stopped at 03/13/21 0451   ??? methylPREDNISolone sodium succinate (PF) (Solu-MEDROL) injection 125 mg  125 mg Intravenous Q4H PRN Norm Parcel, MD       ??? OLANZapine (ZyPREXA) tablet 5 mg  5 mg Oral Nightly Norm Parcel, MD   5 mg at 03/12/21 2038   ??? ondansetron (ZOFRAN) tablet 24 mg  24 mg Oral Q24H Norm Parcel, MD   24 mg at 03/12/21 1733   ??? prochlorperazine (COMPAZINE) injection 10 mg  10 mg Intravenous Q6H PRN Norm Parcel, MD       ??? prochlorperazine (COMPAZINE) tablet 10 mg  10 mg Oral Q6H PRN Norm Parcel, MD       ??? sodium chloride (NS) 0.9 % flush 10 mL  10 mL Intravenous BID Timmy Cleverly Donald Delina Kruczek, PA   10 mL at  03/13/21 0902   ??? sodium chloride (NS) 0.9 % flush 10 mL  10 mL Intravenous BID Mariana Kaufman, PA   10 mL at 03/13/21 0901   ??? sodium chloride (NS) 0.9 % infusion  20 mL/hr Intravenous Continuous Chandria Rookstool Dana Allan, PA       ??? sodium chloride (NS) 0.9 % infusion  125 mL/hr Intravenous Continuous Norm Parcel, MD 125 mL/hr at 03/13/21 0043 125 mL/hr at 03/13/21 0043   ??? sodium chloride (NS) 0.9 % infusion  20 mL/hr Intravenous Continuous PRN Norm Parcel, MD       ??? sodium chloride 0.9% (NS) bolus 1,000 mL  1,000 mL Intravenous Daily PRN Norm Parcel, MD           Physical Exam:  General: Resting, in no apparent distress, sitting up comfortably in chair.  HEENT:  PERRL. No scleral icterus or conjunctival injection. MMM without ulceration, erythema or exudate. No cervical or axillary lymphadenopathy.   Heart:  RRR. S1, S2. No murmurs, gallops, or rubs.  Lungs:  Normal effort of breathing, speaking full sentences with ease. CTAB. No rales, ronchi, or crackles.    Abdomen:  No distention or pain on palpation.  Bowel sounds are present and normoactive x 4.  No palpable hepatomegaly or splenomegaly.  No palpable masses.  Skin:  No rashes, petechiae or purpura.  No areas of skin breakdown. Warm to touch, dry, smooth, and even. Linear vertical scar present over abdomen.   Musculoskeletal:  No grossly-evident joint effusions or deformities.  Range of motion about the shoulder, elbow, hips and knees is grossly normal.  Psychiatric:  Range of affect is appropriate.    Neurologic:  Alert and oriented to person, place, time and situation.  Gait is normal.  No Nystagmus. CNII-CNXII grossly intact.  Extremities:  Appear well-perfused. No clubbing, edema, or cyanosis.  CVAD: R CW Port - no erythema, nontender; dressing CDI.      Labs:  Recent Labs     03/12/21  0427 03/13/21  0434   WBC 5.1 - 5.2 9.0   NEUTROABS 4.4 - 4.4 7.9*   LYMPHSABS 0.6* - 0.7* 0.6*   HGB 12.8* - 12.8* 11.9*   HCT 36.5* - 36.5* 33.5*   PLT 112* - 111* 115*   CREATININE 1.55* - 1.55* 1.79*   BUN 20 - 20 26* BILITOT 0.6  --    AST 27  --    ALT 36  --    ALKPHOS 45*  --    K 4.8 - 4.8 4.5   MG 1.7  --    CALCIUM 8.4* - 8.4* 8.2*   NA 139 - 139 140   CL 110* - 110* 112*   CO2 24.0 - 24.0 22.0   PHOS 1.5*  --        Imaging:  No new.    Mariana Kaufman, PA-C  03/13/2021  10:42 AM   Hematology/Oncology Department   [facility]   Group pager: [phone]

## 2021-03-13 NOTE — Unmapped (Signed)
Care Management  Initial Transition Planning Assessment              General  Care Manager assessed the patient by : Telephone conversation with family, Discussion with Clinical Care team, Medical record review  Orientation Level: Oriented X4  Functional level prior to admission: Independent  Reason for referral: Discharge Planning    Contact/Decision Maker  Extended Emergency Contact Information  Primary Emergency Contact: Deemer,Karen  Mobile Phone: [phone]  Relation: Spouse    Legal Next of Kin / Guardian / POA / Advance Directives       Advance Directive (Medical Treatment)  Does patient have an advance directive covering medical treatment?: Patient does not have advance directive covering medical treatment. (Patient's wife is his HCDM should he need one.)  Advance directive covering medical treatment not in Chart:: Copy requested from family  Reason patient does not have an advance directive covering medical treatment:: Patient does not wish to complete one at this time.    Health Care Decision Maker [HCDM] (Medical & Mental Health Treatment)  Healthcare Decision Maker: HCDM documented in the HCDM/Contact Info section.  Information offered on HCDM, Medical & Mental Health advance directives:: Patient given information.         Readmission Information    Have you been hospitalized in the last 30 days?: No                                Did the following happen with your discharge?                                                     Patient Information  Lives with: Spouse/significant other    Type of Residence: Private residence        Location/Detail: 5 steps with a handrail to enter the residence   Type of Residence: Mailing Address:  [address]  [place] [address]  Contacts: Accompanied by: Alone  Patient Phone Number:   Telephone Information:   Mobile [phone]           Medical Provider(s): ERIC Gelene Mink, MD  Reason for Admission: Admitting Diagnosis:  Dedifferentiated liposarcoma (CMS-HCC) [C49.9]  Past Medical History:   has no past medical history on file.  Past Surgical History:   has a past surgical history that includes IR Insert Port Age Greater Than 5 Years (03/09/2021).   Previous admit date: N/A    Primary Insurance- Payor: AETNA MEDICARE ADV / Plan: AETNA MEDICARE ADVANTAGE / Product Type: *No Product type* /   Secondary Insurance - None  Prescription Coverage - Clinical research associate - [facility] [phone] - [place], [place] - [address]  [facility]    Transportation home: Private vehicle (wife will drive him home)            Support Systems/Concerns: Spouse    Responsibilities/Dependents at home?: No    Home Care services in place prior to admission?: No                  Equipment Currently Used at Home: none       Currently receiving outpatient dialysis?: No       Financial Information       Need for financial assistance?:  No       Social Determinants of Health  Social Determinants of Health were addressed in provider documentation.  Please refer to patient history.  Social Determinants of Health     Tobacco Use: Low Risk    ??? Smoking Tobacco Use: Never Smoker   ??? Smokeless Tobacco Use: Never Used   Alcohol Use: Not on file   Financial Resource Strain: Low Risk    ??? Difficulty of Paying Living Expenses: Not very hard   Food Insecurity: No Food Insecurity   ??? Worried About Running Out of Food in the Last Year: Never true   ??? Ran Out of Food in the Last Year: Never true   Transportation Needs: No Transportation Needs   ??? Lack of Transportation (Medical): No   ??? Lack of Transportation (Non-Medical): No   Physical Activity: Not on file   Stress: Not on file   Social Connections: Not on file   Intimate Partner Violence: Not on file   Depression: Not on file   Housing/Utilities: Low Risk    ??? Within the past 12 months, have you ever stayed: outside, in a car, in a tent, in an overnight shelter, or temporarily in someone else's home (i.e. couch-surfing)?: No   ??? Are you worried about losing your housing?: No   ??? Within the past 12 months, have you been unable to get utilities (heat, electricity) when it was really needed?: No   Substance Use: Not on file   Health Literacy: Not on file       Complex Discharge Information    Is patient identified as a difficult/complex discharge?: No                                                               Interventions:       Discharge Needs Assessment  Concerns to be Addressed: discharge planning    Clinical Risk Factors: > 65, Principal Diagnosis: Cancer, Stroke, COPD, Heart Failure, AMI, Pneumonia, Joint Replacment    Barriers to taking medications: No    Prior overnight hospital stay or ED visit in last 90 days: Yes [facility])              Anticipated Changes Related to Illness: none    Equipment Needed After Discharge: none    Discharge Facility/Level of Care Needs: other (see comments) (Home)    Readmission  Risk of Unplanned Readmission Score: UNPLANNED READMISSION SCORE: 18.54%  Predictive Model Details          19% (Medium)  Factor Value    Calculated 03/12/2021 16:06 22% Number of active Rx orders 32    [place] Risk of Unplanned Readmission Model 11% Diagnosis of cancer present     10% Active antipsychotic Rx order present     9% ECG/EKG order present in last 6 months     9% Latest calcium low (8.4 mg/dL)     7% Imaging order present in last 6 months     6% Latest hemoglobin low (12.8 g/dL)     6% Phosphorous result present     5% Age 64     4% Active corticosteroid Rx order present     4% Latest creatinine high (1.55 mg/dL)     2% Charlson Comorbidity Index  2     2% Future appointment scheduled     1% Current length of stay 1.164 days     1% Active ulcer medication Rx order present      Readmitted Within the Last 30 Days? (No if blank)   Patient at risk for readmission?: Yes    Discharge Plan  Screen findings are: Care Manager reviewed the plan of the patient's care with the Multidisciplinary Team. No discharge planning needs identified at this time. Care Manager will continue to manage plan and monitor patient's progress with the team.    Expected Discharge Date: 03/15/2021    Expected Transfer from Critical Care:      Quality data for continuing care services shared with patient and/or representative?: N/A  Patient and/or family were provided with choice of facilities / services that are available and appropriate to meet post hospital care needs?: N/A       Initial Assessment complete?: Yes

## 2021-03-13 NOTE — Unmapped (Incomplete)
Pt alert and oriented. Pt bradycardic overnight, NA told RN his HR was jumping around but 48, RN notified HCP on call.  HR done manually and was 60, pt denied any symptoms and stated he was fine.  I hung his Mesna and drew his labs while I was in the room and he had a full conversation with me and was laughing.  HCP ordered an EKG, otherwise VSS.  Pt remained free from falls and afebrile throughout the shift.  Pt complained of hiccups, HCP notified and ordered Baclofen, by the time it was verified by pharmacy pt stated his hiccups were gone but that they would probably return so not to get rid of it, so medication was held.  No acute events overnight. Pt denies pain.  Pt remains on room air with good results.  Safety measures remain in place.  Will continue to monitor.       Vitals:    03/12/21 2101 03/13/21 0000 03/13/21 0416 03/13/21 0432   BP: 126/68 105/62 119/61    Pulse: 68 56 (!) 48 60   Resp: 16 16 17     Temp: 36.4 ??C (97.5 ??F) 36.3 ??C (97.3 ??F) 36.3 ??C (97.3 ??F)    TempSrc: Oral Oral Oral    SpO2:  97% 98%    Weight:       Height:         Problem: Adult Inpatient Plan of Care  Goal: Plan of Care Review  Outcome: Progressing  Flowsheets (Taken 03/13/2021 0105)  Progress: improving  Plan of Care Reviewed With: patient  Goal: Patient-Specific Goal (Individualized)  Outcome: Progressing  Flowsheets (Taken 03/13/2021 0105)  Patient-Specific Goals (Include Timeframe): Pt would like care clustered so he can get some rest overnight.  Individualized Care Needs: Cluster care, empathetic listening, presence, provide medicaitons as needed per [facility]  Anxieties, Fears or Concerns: Patient is concerned that he will have side effects from his chemotherapy.  Goal: Absence of Hospital-Acquired Illness or Injury  Outcome: Progressing  Intervention: Identify and Manage Fall Risk  Recent Flowsheet Documentation  Taken 03/12/2021 2032 by Weber Cooks, RN  Safety Interventions:  ??? aspiration precautions  ??? bleeding precautions  ??? chemotherapeutic agent precautions  ??? commode/urinal/bedpan at bedside  ??? environmental modification  ??? fall reduction program maintained  ??? lighting adjusted for tasks/safety  ??? low bed  ??? nonskid shoes/slippers when out of bed  ??? room near unit station  Intervention: Prevent Skin Injury  Recent Flowsheet Documentation  Taken 03/12/2021 2032 by Weber Cooks, RN  Skin Protection:  ??? adhesive use limited  ??? protective footwear used  ??? transparent dressing maintained  ??? tubing/devices free from skin contact  Intervention: Prevent and Manage VTE (Venous Thromboembolism) Risk  Recent Flowsheet Documentation  Taken 03/12/2021 2032 by Weber Cooks, RN  VTE Prevention/Management:  ??? ambulation promoted  ??? bleeding precautions maintained  ??? bleeding risk factors identified  ??? dorsiflexion/plantar flexion performed  ??? fluids promoted  Goal: Optimal Comfort and Wellbeing  Outcome: Progressing  Goal: Readiness for Transition of Care  Outcome: Progressing  Goal: Rounds/Family Conference  Outcome: Progressing     Problem: Fall Injury Risk  Goal: Absence of Fall and Fall-Related Injury  Intervention: Identify and Manage Contributors  Recent Flowsheet Documentation  Taken 03/12/2021 2032 by Weber Cooks, RN  Self-Care Promotion:  ??? independence encouraged  ??? BADL personal objects within reach  ??? BADL personal routines maintained  Intervention: Promote Scientist, clinical (histocompatibility and immunogenetics) Documentation  Taken 03/12/2021 2032 by Weber Cooks, RN  Safety Interventions:  ??? aspiration precautions  ??? bleeding precautions  ??? chemotherapeutic agent precautions  ??? commode/urinal/bedpan at bedside  ??? environmental modification  ??? fall reduction program maintained  ??? lighting adjusted for tasks/safety  ??? low bed  ??? nonskid shoes/slippers when out of bed  ??? room near unit station     Problem: Anemia (Chemotherapy Effects)  Goal: Anemia Symptom Improvement  Outcome: Progressing  Intervention: Monitor and Manage Anemia  Recent Flowsheet Documentation  Taken 03/12/2021 2032 by Weber Cooks, RN  Safety Interventions:  ??? aspiration precautions  ??? bleeding precautions  ??? chemotherapeutic agent precautions  ??? commode/urinal/bedpan at bedside  ??? environmental modification  ??? fall reduction program maintained  ??? lighting adjusted for tasks/safety  ??? low bed  ??? nonskid shoes/slippers when out of bed  ??? room near unit station  Fatigue Management:  ??? activity schedule adjusted  ??? fatigue-related activity identified  ??? frequent rest breaks encouraged  ??? paced activity encouraged     Problem: Nausea and Vomiting (Chemotherapy Effects)  Goal: Fluid and Electrolyte Balance  Outcome: Progressing  Intervention: Prevent and Manage Nausea and Vomiting  Recent Flowsheet Documentation  Taken 03/12/2021 2032 by Weber Cooks, RN  Environmental Support:  ??? calm environment promoted  ??? distractions minimized  ??? environmental consistency promoted  ??? personal routine supported  ??? rest periods encouraged  ??? rooming-in facilitated     Problem: Oral Mucositis (Chemotherapy Effects)  Goal: Improved Oral Mucous Membrane Integrity  Outcome: Progressing  Intervention: Promote Oral Comfort and Health  Recent Flowsheet Documentation  Taken 03/12/2021 2032 by Weber Cooks, RN  Oral Mucous Membrane Protection:  ??? nonirritating oral fluids promoted  ??? nonirritating oral foods promoted     Problem: Thrombocytopenia Bleeding Risk (Chemotherapy Effects)  Goal: Absence of Bleeding  Outcome: Progressing  Intervention: Minimize Bleeding Risk  Recent Flowsheet Documentation  Taken 03/12/2021 2032 by Weber Cooks, RN  Bleeding Precautions:  ??? blood pressure closely monitored  ??? coagulation study results reviewed  ??? foot protection facilitated  ??? gentle oral care promoted  ??? monitored for signs of bleeding

## 2021-03-14 DIAGNOSIS — E785 Hyperlipidemia, unspecified: Secondary | ICD-10-CM | POA: Diagnosis not present

## 2021-03-14 DIAGNOSIS — C499 Malignant neoplasm of connective and soft tissue, unspecified: Secondary | ICD-10-CM | POA: Diagnosis not present

## 2021-03-14 DIAGNOSIS — N179 Acute kidney failure, unspecified: Secondary | ICD-10-CM | POA: Diagnosis not present

## 2021-03-14 DIAGNOSIS — Z5111 Encounter for antineoplastic chemotherapy: Secondary | ICD-10-CM | POA: Diagnosis not present

## 2021-03-14 LAB — CBC W/ AUTO DIFF
BASOPHILS ABSOLUTE COUNT: 0 10*9/L (ref 0.0–0.1)
BASOPHILS RELATIVE PERCENT: 0.2 %
EOSINOPHILS ABSOLUTE COUNT: 0 10*9/L (ref 0.0–0.5)
EOSINOPHILS RELATIVE PERCENT: 0 %
HEMATOCRIT: 31.9 % — ABNORMAL LOW (ref 39.0–48.0)
HEMOGLOBIN: 11.1 g/dL — ABNORMAL LOW (ref 12.9–16.5)
LYMPHOCYTES ABSOLUTE COUNT: 0.4 10*9/L — ABNORMAL LOW (ref 1.1–3.6)
LYMPHOCYTES RELATIVE PERCENT: 5 %
MEAN CORPUSCULAR HEMOGLOBIN CONC: 34.9 g/dL (ref 32.0–36.0)
MEAN CORPUSCULAR HEMOGLOBIN: 29.1 pg (ref 25.9–32.4)
MEAN CORPUSCULAR VOLUME: 83.5 fL (ref 77.6–95.7)
MEAN PLATELET VOLUME: 7.7 fL (ref 6.8–10.7)
MONOCYTES ABSOLUTE COUNT: 0.2 10*9/L — ABNORMAL LOW (ref 0.3–0.8)
MONOCYTES RELATIVE PERCENT: 2.9 %
NEUTROPHILS ABSOLUTE COUNT: 7.5 10*9/L (ref 1.8–7.8)
NEUTROPHILS RELATIVE PERCENT: 91.9 %
PLATELET COUNT: 122 10*9/L — ABNORMAL LOW (ref 150–450)
RED BLOOD CELL COUNT: 3.82 10*12/L — ABNORMAL LOW (ref 4.26–5.60)
RED CELL DISTRIBUTION WIDTH: 16.1 % — ABNORMAL HIGH (ref 12.2–15.2)
WBC ADJUSTED: 8.1 10*9/L (ref 3.6–11.2)

## 2021-03-14 LAB — URINALYSIS
BACTERIA: NONE SEEN /HPF
BILIRUBIN UA: NEGATIVE
BLOOD UA: NEGATIVE
GLUCOSE UA: NEGATIVE
KETONES UA: >150 — AB
LEUKOCYTE ESTERASE UA: NEGATIVE
NITRITE UA: NEGATIVE
PH UA: 6.5 (ref 5.0–9.0)
PROTEIN UA: NEGATIVE
RBC UA: <1 /HPF (ref <=3)
SPECIFIC GRAVITY UA: 1.014 (ref 1.003–1.030)
SQUAMOUS EPITHELIAL: <1 /HPF (ref 0–5)
UROBILINOGEN UA: <2
WBC UA: <1 /HPF (ref <=2)

## 2021-03-14 LAB — BASIC METABOLIC PANEL
ANION GAP: 3 mmol/L — ABNORMAL LOW (ref 5–14)
BLOOD UREA NITROGEN: 25 mg/dL — ABNORMAL HIGH (ref 9–23)
BUN / CREAT RATIO: 14
CALCIUM: 7.9 mg/dL — ABNORMAL LOW (ref 8.7–10.4)
CHLORIDE: 116 mmol/L — ABNORMAL HIGH (ref 98–107)
CO2: 22 mmol/L (ref 20.0–31.0)
CREATININE: 1.8 mg/dL — ABNORMAL HIGH
EGFR CKD-EPI (2021) MALE: 41 mL/min/{1.73_m2} — ABNORMAL LOW (ref >=60)
GLUCOSE RANDOM: 132 mg/dL (ref 70–179)
POTASSIUM: 4.8 mmol/L (ref 3.4–4.8)
SODIUM: 141 mmol/L (ref 135–145)

## 2021-03-14 MED ADMIN — sodium chloride (NS) 0.9 % infusion: 125 mL/h | INTRAVENOUS | @ 13:00:00 | Stop: 2021-03-14

## 2021-03-14 MED ADMIN — sodium chloride (NS) 0.9 % flush 10 mL: 10 mL | INTRAVENOUS | @ 01:00:00

## 2021-03-14 MED ADMIN — mesna (MESNEX) 1,421 mg in sodium chloride (NS) 0.9 % 50 mL IVPB: 667 mg/m2 | INTRAVENOUS | @ 05:00:00 | Stop: 2021-03-14

## 2021-03-14 MED ADMIN — OLANZapine (ZyPREXA) tablet 5 mg: 5 mg | ORAL | @ 01:00:00 | Stop: 2021-03-13

## 2021-03-14 MED ADMIN — sodium chloride (NS) 0.9 % flush 10 mL: 10 mL | INTRAVENOUS | @ 13:00:00 | Stop: 2021-03-14

## 2021-03-14 MED ADMIN — docusate sodium (COLACE) capsule 100 mg: 100 mg | ORAL | @ 13:00:00 | Stop: 2021-03-14

## 2021-03-14 MED ADMIN — calcium carbonate (TUMS) chewable tablet 200 mg of elem calcium: 200 mg | ORAL | @ 01:00:00

## 2021-03-14 MED ADMIN — sodium chloride (NS) 0.9 % infusion: 125 mL/h | INTRAVENOUS | @ 05:00:00 | Stop: 2021-03-14

## 2021-03-14 MED ADMIN — mesna (MESNEX) 1,421 mg in sodium chloride (NS) 0.9 % 50 mL IVPB: 667 mg/m2 | INTRAVENOUS | @ 09:00:00 | Stop: 2021-03-14

## 2021-03-14 NOTE — Unmapped (Signed)
Physician Discharge Summary [facility]  4 ONC [facility]  [address]  [place] [address]  Dept: [phone]  Loc: [phone]     Identifying Information:   Jeremy Faulkner  1956/04/25  [id number]    Primary Care Physician: Glori Luis, MD     Referring Physician: Norm Parcel     Code Status: Full Code    Admit Date: 03/11/2021    Discharge Date: 03/14/2021     Discharge To: Home    Discharge Service: [facility] - Hematology APP Floor Team (MEDQ)     Discharge Attending Physician: Dr Iona Coach    Discharge Diagnoses:  Active Problems:    * No active hospital problems. *  Resolved Problems:    * No resolved hospital problems. *      Outpatient Provider Follow Up Issues:   Supportive Care Recommendations:  We recommend based on the patient???s underlying diagnosis and treatment history the following supportive care:    1. Antimicrobial prophylaxis:  None    2. Blood product support:  Leukoreduced blood products are required.  Irradiated blood products are preferred, but in case of urgent transfusion needs non-irradiated blood products may be used:     3. Hematopoietic growth factor support: pegylated filgrastim 24-72 hours after last chemotherapy dose    Hospital Course:   Jeremy Faulkner is an 64 y.o. male with a PMHx of hyperlipidemia who was admitted for scheduled chemotherapy for liposarcoma. Pt tolerated AIM well without any complications. Will discharge with Zyprexa x3 days HS and PRN Compazine and Zofran for nausea prophylaxis. GCSF appointment scheduled at [facility] infusion clinic on 9/5. Follow up scheduled with Dr. Dorna Mai on 9/20 at [facility].      De-differentiated Liposarcoma: Followed by Dr. Dorna Mai at [facility]. De-differentiated liposarcoma of right kidney, localized disease only. S/p right nephrectomy June 2022.  Recent ECHO with LVEF 55-60%. Port placed 08/29. Patient presents for cycle one of AIM. Doxorubicin dosed 75mg /m2 IV x1 bolus on day one per Dr. Mechele Claude Consensus to avoid interruptions with administration. Ifosfamide dose reduced to 2mg /m2 and decreased to 3 day regimen given elevated serum creatinine/single kidney as well as to prevent prolonged neutropenia. Chemotherapy started on 8/31 and was tolerated well, without complication. COVID pcr negative on admission.     HLD: On Rosuvastatin 20mg  every day at home. Held on admission but can restart on discharge.     Acute Kidney Injury: Patient with worsening Serum Creatinine since 6/25 in setting of right sided nephrectomy secondary to liposarcoma. Serum Creatinine 1.65 on admission. Ifosphamide dose reduced. Educated to avoid other nephrotoxic medications at home. On Discharge, SCr 1.8, voiding well.      Chronic Constipation: Often goes 3-4 days without a bowel movement for multiple years. States he was recently on colace after his surgery with good improvement in bowel function. Continue colace every day.       Procedures:  None  ONCBCN INFUSION APPOINTMENT REQUEST [[id number]]  ______________________________________________________________________  Discharge Medications:     Your Medication List      START taking these medications    docusate sodium 100 MG capsule  Commonly known as: COLACE  Take 1 capsule (100 mg total) by mouth daily.        CONTINUE taking these medications    OLANZapine 5 MG tablet  Commonly known as: ZyPREXA  Take 1 tablet (5 mg total) by mouth nightly. Take nightly for 3 days following chemotherapy to prevent nausea.     ondansetron 8 MG tablet  Commonly known as: ZOFRAN  Take 1 tablet (8 mg total) by mouth every eight (8) hours as needed for nausea (or vomiting).     pegfilgrastim-bmez 6 mg/0.6 mL injection  Commonly known as: ZIEXTENZO  Inject 1 syringe (0.38mL) under the skin once every 21 days. Inject 24-72h after last chemotherapy dose on day 5.     prochlorperazine 10 MG tablet  Commonly known as: COMPAZINE  Take 1 tablet (10 mg total) by mouth every six (6) hours as needed (nausea or vomitting). rosuvastatin 20 MG tablet  Commonly known as: CRESTOR  Take 1 tablet by mouth daily.            Allergies:  Patient has no known allergies.  ______________________________________________________________________  Pending Test Results (if blank, then none):      Most Recent Labs:  All lab results last 24 hours -   Recent Results (from the past 24 hour(s))   Basic Metabolic Panel    Collection Time: 03/14/21  1:07 AM   Result Value Ref Range    Sodium 141 135 - 145 mmol/L    Potassium 4.8 3.4 - 4.8 mmol/L    Chloride 116 (H) 98 - 107 mmol/L    CO2 22.0 20.0 - 31.0 mmol/L    Anion Gap 3 (L) 5 - 14 mmol/L    BUN 25 (H) 9 - 23 mg/dL    Creatinine 1.61 (H) 0.60 - 1.10 mg/dL    BUN/Creatinine Ratio 14     eGFR CKD-EPI (2021) Male 41 (L) >=60 mL/min/1.57m2    Glucose 132 70 - 179 mg/dL    Calcium 7.9 (L) 8.7 - 10.4 mg/dL   CBC w/ Differential    Collection Time: 03/14/21  1:07 AM   Result Value Ref Range    WBC 8.1 3.6 - 11.2 10*9/L    RBC 3.82 (L) 4.26 - 5.60 10*12/L    HGB 11.1 (L) 12.9 - 16.5 g/dL    HCT 09.6 (L) 04.5 - 48.0 %    MCV 83.5 77.6 - 95.7 fL    MCH 29.1 25.9 - 32.4 pg    MCHC 34.9 32.0 - 36.0 g/dL    RDW 40.9 (H) 81.1 - 15.2 %    MPV 7.7 6.8 - 10.7 fL    Platelet 122 (L) 150 - 450 10*9/L    Neutrophils % 91.9 %    Lymphocytes % 5.0 %    Monocytes % 2.9 %    Eosinophils % 0.0 %    Basophils % 0.2 %    Absolute Neutrophils 7.5 1.8 - 7.8 10*9/L    Absolute Lymphocytes 0.4 (L) 1.1 - 3.6 10*9/L    Absolute Monocytes 0.2 (L) 0.3 - 0.8 10*9/L    Absolute Eosinophils 0.0 0.0 - 0.5 10*9/L    Absolute Basophils 0.0 0.0 - 0.1 10*9/L    Anisocytosis Slight (A) Not Present   Urinalysis    Collection Time: 03/14/21  7:52 AM   Result Value Ref Range    Color, UA Colorless     Clarity, UA Clear     Specific Gravity, UA 1.014 1.003 - 1.030    pH, UA 6.5 5.0 - 9.0    Leukocyte Esterase, UA Negative Negative    Nitrite, UA Negative Negative    Protein, UA Negative Negative    Glucose, UA Negative Negative    Ketones, UA >150 mg/dL (A) Negative    Urobilinogen, UA <2.0 mg/dL <9.1 mg/dL    Bilirubin, UA Negative Negative  Blood, UA Negative Negative    RBC, UA <1 <=3 /HPF    WBC, UA <1 <=2 /HPF    Squam Epithel, UA <1 0 - 5 /HPF    Bacteria, UA None Seen None Seen /HPF       Relevant Studies/Radiology (if blank, then none):  ECG 12 Lead    Result Date: 03/13/2021  SINUS BRADYCARDIA OTHERWISE NORMAL ECG WHEN COMPARED WITH ECG OF 11-Mar-2021 14:32, NO SIGNIFICANT CHANGE WAS FOUND    ECG 12 Lead    Result Date: 03/12/2021  NORMAL SINUS RHYTHM NORMAL ECG NO PREVIOUS ECGS AVAILABLE Confirmed by Mariane Baumgarten ([id number]) on 03/12/2021 3:41:11 PM    ______________________________________________________________________            Other Instructions     Discharge instructions      Diagnosis: Liposarcoma    Course complications:  -  None, you tolerated chemotherapy well      New/Important Medications:  - Colace (over the counter); stool softener to help have a bowel movement;  take once daily to have on BM every day    Follow up Appointments:   - Pegfilgrastim (Neulasta, Udenyca, or equivalent) injection on 9/5 at [facility]  - Dr. Dorna Mai on 9/20 at [facility]     When to Call Your [facility] Team:   Monday- Friday from 8:00 am - 5:00 pm   On Nights, Weekends and Holidays   Call [phone]     Call [phone]  Or Toll free [phone]    Ask the operator to page the   Ask to speak to the Triage Nurse  Oncology Fellow on Call     RED ZONE:  Take action now!  You need to be seen right away. Call 911 or go to your nearest hospital for help.   - Symptoms are at a severe level of discomfort    - Bleeding that will not stop  - Chest Pain    - Hard to breathe    - Fall or passing out    - New Seizure    - Thoughts of hurting yourself or others     YELLOW ZONE: Take action today  This is NOT an all-inclusive list. Pleae call with any new or worsening symptoms.   Call your doctor, nurse or other healthcare provider at [phone]  You can be seen by a provider the same day through our Same Day Acute Care for Patients with Cancer program.   - Symptoms are new or worsening; You are not within your goal range for:    - Pain          - Swelling (leg, arm, abdomen, face, neck)    - Shortness of breath       - Skin rash or skin changes    - Bleeding (nose, urine, stool, wound)    - Wound issues (redness, drainage, re-opened)    - Feeling sick to your stomach and throwing up    - Confusion    - Mouth sores/pain in your mouth or throat    - Vision changes   - Hard stool or very loose stools (increase in ostomy   - Fever >100.4 F, chills   Output)        - Worsening cough with mucus that is green, yellow or bloody   - No urine for 12 hours      - Pain or burning when going to the bathroom    - Feeding tube or other catheter/tube  issue    - Home infusion pump issue - call [phone]   - Redness or pain at previous IV or port/catheter site    - Depressed or anxiety     GREEN ZONE: You are in control   Your symptoms are under control. Continue to take your medicine as ordered. Keep all visits to the doctor.   - No increase or worsening symptoms   - Able to take your medicine   - Able to drink and eat     For your safety and best care, please DO NOT use MyChart messages to report red or yellow symptoms.   MyChart messages are only checked during weekday normal business hours and you should receive a   Response within 2 business days.   Please use MyChart only for the follows:   - Non-urgent medication refills, scheduling requests or general questions.           Patient Education:     Your blood counts may continue to drop, so it is good to adhere to the following precautions:    - Wash your hands routinely with soap and water  - Take your temperature when you have chills or are not feeling well  - Use a soft toothbrush  - Avoid constipation or straining with bowel movements. This may mean you occasionally need to take over-the-counter stool softeners or laxatives.   - Avoid people who have colds or the flu, or are not feeling well.  - Wear a mask when visiting crowded places.  - Avoid anyone who has received a live vaccination (shot) within the last three weeks  - Maintain a well-balanced diet and eat healthy foods  - Speak with your doctor before having any dental work done  - Limit exposure to pet feces (e.g., litter box)  - Do only as much activity as you can tolerate    Other instructions:  - Don't use dental floss if your platelet count is below 50,000. Your doctor or nurse should tell you if this is the case.  - Use any mouthwashes given to you as directed.  - If you can't tolerate regular brushing, use an oral swab (bristle-less) toothbrush, or use salt and baking soda to clean your mouth. Mix 1 teaspoon of salt and 1 teaspoon of baking soda into an 8-ounce glass of warm water. Swish and spit.  - Watch your mouth and tongue for white patches. This is a sign of fungal infection, a common side effect of chemotherapy. Be sure to tell your doctor about these patches. Medication/mouthwashes can be prescribed to help you fight the fungal infection.      COVID-19 is a new challenge, but [facility] and the [facility] is dedicated to providing you and your loved ones with the best possible cancer care and support in the safest way possible during this time. We made two videos about the ways we are working to keep you safe, such as offering the option to visit your care team over the phone or through a video, as well as support services offered for our patients and their caregivers. If you have any questions about your cancer care, please call your care team.  ??  Video #1: Keeping [facility] Cancer Care patients safe during the COVID-19 crisis  http://go.eabjmlille.com  ??  Video #2: Support for cancer patients and their caregivers during the COVID-19 pandemic  http://go.SecureGap.uy          Follow Up instructions and Outpatient Referrals  Discharge instructions          Appointments which have been scheduled for you    Mar 16, 2021 11:00 AM  (Arrive by 10:30 AM)  LEVEL 060 with [employer] CHAIR 21  [place] [facility] INFUSION [facility] REGION) [address]  [place] [address]  [phone]      Mar 31, 2021  9:30 AM  (Arrive by 9:00 AM)  NURSE LAB DRAW with ADULT ONC LAB  [facility] ADULT ONCOLOGY LAB DRAW STATION [facility] REGION) [address]  [place] [address]  [phone]      Mar 31, 2021 10:30 AM  (Arrive by 10:00 AM)  RETURN ACTIVE [place] with Norm Parcel, MD  [place] ONCOLOGY MULTIDISCIPLINARY 2ND FLR CANCER HOSP [facility] REGION) [address]  [place] [address]  [phone]           ______________________________________________________________________  Discharge Day Services:  BP 140/67  - Pulse 67  - Temp 35.8 ??C (96.4 ??F) (Oral)  - Resp 18  - Ht 188.1 cm (6' 2.06)  - Wt 89.5 kg (197 lb 6.4 oz)  - SpO2 98%  - BMI 25.31 kg/m??   Pt seen on the day of discharge and determined appropriate for discharge.    Condition at Discharge: good    Length of Discharge: I spent greater than 30 mins in the discharge of this patient.

## 2021-03-14 NOTE — Unmapped (Signed)
Pt ambulated halls. AVS reviewed, follow up instructions provided. Discharged to home.     Problem: Adult Inpatient Plan of Care  Goal: Plan of Care Review  Outcome: Discharged to Home  Goal: Patient-Specific Goal (Individualized)  Outcome: Discharged to Home  Goal: Absence of Hospital-Acquired Illness or Injury  Outcome: Discharged to Home  Intervention: Identify and Manage Fall Risk  Recent Flowsheet Documentation  Taken 03/14/2021 0735 by Ronie Spies, RN  Safety Interventions:   bleeding precautions   chemotherapeutic agent precautions   infection management   lighting adjusted for tasks/safety   low bed   nonskid shoes/slippers when out of bed  Intervention: Prevent and Manage VTE (Venous Thromboembolism) Risk  Recent Flowsheet Documentation  Taken 03/14/2021 1128 by Ronie Spies, RN  Activity Management: ambulated outside room  Taken 03/14/2021 0735 by Ronie Spies, RN  Activity Management: activity encouraged  Intervention: Prevent Infection  Recent Flowsheet Documentation  Taken 03/14/2021 0735 by Ronie Spies, RN  Infection Prevention: hand hygiene promoted  Goal: Optimal Comfort and Wellbeing  Outcome: Discharged to Home  Goal: Readiness for Transition of Care  Outcome: Discharged to Home  Goal: Rounds/Family Conference  Outcome: Discharged to Home     Problem: Anemia (Chemotherapy Effects)  Goal: Anemia Symptom Improvement  Outcome: Discharged to Home  Intervention: Monitor and Manage Anemia  Recent Flowsheet Documentation  Taken 03/14/2021 0735 by Ronie Spies, RN  Safety Interventions:   bleeding precautions   chemotherapeutic agent precautions   infection management   lighting adjusted for tasks/safety   low bed   nonskid shoes/slippers when out of bed     Problem: Urinary Bleeding Risk or Actual (Chemotherapy Effects)  Goal: Absence of Hematuria  Outcome: Discharged to Home     Problem: Nausea and Vomiting (Chemotherapy Effects)  Goal: Fluid and Electrolyte Balance  Outcome: Discharged to Home Problem: Neurotoxicity (Chemotherapy Effects)  Goal: Neurotoxicity Symptom Control  Outcome: Discharged to Home     Problem: Neutropenia (Chemotherapy Effects)  Goal: Absence of Infection  Outcome: Discharged to Home  Intervention: Prevent Infection and Maximize Resistance  Recent Flowsheet Documentation  Taken 03/14/2021 0735 by Ronie Spies, RN  Infection Prevention: hand hygiene promoted     Problem: Oral Mucositis (Chemotherapy Effects)  Goal: Improved Oral Mucous Membrane Integrity  Outcome: Discharged to Home     Problem: Thrombocytopenia Bleeding Risk (Chemotherapy Effects)  Goal: Absence of Bleeding  Outcome: Discharged to Home  Intervention: Minimize Bleeding Risk  Recent Flowsheet Documentation  Taken 03/14/2021 0735 by Ronie Spies, RN  Bleeding Precautions: monitored for signs of bleeding     Problem: Fall Injury Risk  Goal: Absence of Fall and Fall-Related Injury  Intervention: Promote Injury-Free Environment  Recent Flowsheet Documentation  Taken 03/14/2021 0735 by Ronie Spies, RN  Safety Interventions:   bleeding precautions   chemotherapeutic agent precautions   infection management   lighting adjusted for tasks/safety   low bed   nonskid shoes/slippers when out of bed

## 2021-03-16 ENCOUNTER — Ambulatory Visit: Admit: 2021-03-16 | Discharge: 2021-03-17 | Payer: MEDICARE

## 2021-03-16 DIAGNOSIS — C499 Malignant neoplasm of connective and soft tissue, unspecified: Secondary | ICD-10-CM | POA: Diagnosis not present

## 2021-03-16 MED ADMIN — pegfilgrastim-cbqv (UDENYCA) injection 6 mg: 6 mg | SUBCUTANEOUS | @ 15:00:00 | Stop: 2021-03-16

## 2021-03-16 NOTE — Unmapped (Signed)
SubQ injection given w/o complication. Patient discharged to home, NAD.

## 2021-03-16 NOTE — Unmapped (Signed)
No visits with results within 1 Day(s) from this visit.   Latest known visit with results is:   No results displayed because visit has over 200 results.

## 2021-03-27 DIAGNOSIS — C499 Malignant neoplasm of connective and soft tissue, unspecified: Principal | ICD-10-CM

## 2021-03-27 NOTE — Unmapped (Signed)
Hello,    Please scheduled patient Jeremy Faulkner on 04/01/21 for a SCHED ADM THROUGH INF at  10:00 AM     ?? Reason for Admission: Scheduled chemotherapy   ?? Primary Diagnosis:  Encounter for antineoplastic chemotherapy for sarcoma  ?? Primary Diagnosis ICD-10 Code:  Z51.11 & c49.9  ?? Expected length of stay: 4 Days   ?? CPT Code:  [address]   ?? CPT Code Description: Under Injection and Intravenous Infusion Chemotherapy and Other Highly Complex Drug or Highly Complex Biologic Agent Administration   ?? Treating Attending: Dorna Mai  ?? Need for PICC placement in Infusion? No    Infusion Scheduling: Please notify the patient of the appointment date and time once scheduled. Please document this information within this encounter and then route to the navigator prior to closing the encounter.       Scheduled Admissions Assessment        Assessment  Response  Intervention    Type of insurance  Medicare Financial counselor referral is not warranted this time.     Reliable Trasportation  Yes Referral for Social Work is not warranted this time.   Central Line Access  Yes Patient has line access and does not require an intervention at this time     Other Pertinent Information:  N/A    Provider:  1. Please place order for PICC  no   Order name: Insert PICC line by Vascular Access (must have the house for  outpatient designation)  2.Please place order for [facility] plan for infusion center     Nurse:   Route this smart phrase to Provider (which signals need to place and sign chemotherapy orders, and to place order for PICC line (if applicable) and line care plan in the infusion center on day of admission)    Thank you,  Lajuana Matte

## 2021-03-30 MED ORDER — EMPTY CONTAINER
2 refills | 0 days
Start: 2021-03-30 — End: ?

## 2021-03-30 MED FILL — EMPTY CONTAINER: 120 days supply | Qty: 1 | Fill #0

## 2021-03-30 NOTE — Unmapped (Signed)
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  I am speaking to patient and wife.    Was a Nurse, learning disability used for this call? No    Verified patient's date of birth / HIPAA.    Specialty medication(s) to be sent: Hematology/Oncology: Ziextenzo    Non-specialty medications/supplies to be sent: Sharps container    Medications not needed at this time: none     Ziextenzo (pegfilgrastim)    Medication & Administration     Dosage: Inject 1 syringe (0.41mL) under the skin once every 21 days. Inject 24-72h after last chemotherapy dose on day 5.    Administration: Inject under the skin of the thigh, abdomen, buttocks or upper arm. Rotate sites with each injection.  ??? Injection instructions   o Take 1 syringe out of the refrigerator and allow to stand at room temperature for at least 15-30 minutes  o Wash hands and remove syringe from the tray  o Check the syringe for the following   - Expiration date  - Medication is clear and colorless to slightly yellow and free from particles   - It appears unused or damaged and the gray needle cap is securely attached and the clear needle guard has not been activated (if the needle guard is covering the needle that means it has been activated)  o Choose your injection site (abdomen but not within 2 inches of the navel, thigh, or if someone else is injecting you may also use upper arms or upper outer area of buttock).  Choose a different site each time you give yourself an injection and do not inject into areas where the skin is tender, bruised, red, scaly or hard.  Avoid areas with scars or stretch marks  o Clean the injection site with an alcohol wipe using a circular motion and allow it to air dry completely (5-10 seconds)  o Hold the prefilled syringe by the syringe barrel.  Carefully pull the needle cap straight off and discard  o With your other hand, gently pinch the skin at the injection site to create a firm surface.  Insert the needle in to skin at a 45-90 degree angle. Push the needle all the way in to ensure that the medicine can be fully injected.  o Slowly press down the plunger head as far as it will go until the plunger head is completely between the needle guard wings (keep skin pinched during this)  o Keep the plunger fully pressed down while you carefully pull the needle straight out from the injection site and off your skin  o Slowly release the plunger and allow the syringe needle guard to automatically cover the exposed needle  o Dispose of the used prefilled syringe into a sharps container or hard plastic bottle.  o If there is blood at the injection site gently press a cotton ball or gauze to the site. Do not rub the injection site. Apply an adhesive bandage if needed    Adherence/Missed dose instruction: If a dose is missed, call your doctor.    Goals of Therapy     Stimulate the growth of neutrophils (a type of white blood important to fight against infection) used after chemotherapy.    Side Effects & Monitoring Parameters   ??? Injection site irritation  ??? Pain/aching in the bones, arms and legs    The following side  effects should be reported to the provider:  ??? Signs of an allergic reaction (rash, hives, shortness of breath, tongue or throat swelling)  ??? Kidney problems (unable to pass urine, blood in urine, change in amount of urine passed, change in urine color, or weight gain)  ??? Lung problems (trouble breathing, new or worsening of cough)  ??? Capillary leak syndrome (abnormal heartbeat, chest pain, shortness of breath, weight gain, vomiting blood or vomit that looks like coffee grounds, black or tarry stools)  ??? Spleen rupture (pain in left upper stomach area or left shoulder)  ??? Inflammation of the aorta (fever, abdominal pain, fatigue, back pain    Contraindications, Warnings, & Precautions     ??? Hypersensitivity to pegfilgrastim, filgrastim, or any components of the formulation  ??? Allergy to latex  ??? Pegfilgrastim does cross the placenta therefore you should not become pregnant during treatment  ??? It is not known if pegfilgrastim passes into breastmilk therefore not recommended    Drug/Food Interactions     ??? Medication list reviewed in Epic. The patient was instructed to inform the care team before taking any new medications or supplements. No drug interactions identified.     Storage, Handling Precautions, & Disposal     ??? Ziextenzo should be stored in the refrigerator.   ??? Avoid freezing syringe but if frozen may be thawed one time. If frozen more than 1 time use a new syringe  ??? Discard if kept at room temperature for >120 hours  ??? Do not open the outer carton until you are ready to use the syringe in order to protect it from light  ??? Do not use if the syringe has been dropped on a hard surface.  It may be broken even if you cannot see the break  ??? Do not shake the prefilled syringe  ??? Keep out of the reach of children  ??? Place used devices into a sharps container for disposal (which we can supply along with band-aids and alcohol pads) or hard plastic container     Current Medications (including OTC/herbals), Comorbidities and Allergies     Current Outpatient Medications   Medication Sig Dispense Refill   ??? docusate sodium (COLACE) 100 MG capsule Take 1 capsule (100 mg total) by mouth daily. 30 capsule 0   ??? OLANZapine (ZYPREXA) 5 MG tablet Take 1 tablet (5 mg total) by mouth nightly. Take nightly for 3 days following chemotherapy to prevent nausea. (Patient not taking: Reported on 03/10/2021) 18 tablet 0   ??? ondansetron (ZOFRAN) 8 MG tablet Take 1 tablet (8 mg total) by mouth every eight (8) hours as needed for nausea (or vomiting). (Patient not taking: Reported on 03/10/2021) 30 tablet 3   ??? pegfilgrastim-bmez (ZIEXTENZO) 6 mg/0.6 mL injection Inject 1 syringe (0.78mL) under the skin once every 21 days. Inject 24-72h after last chemotherapy dose on day 5. (Patient not taking: Reported on 03/10/2021) 0.6 mL 3   ??? prochlorperazine (COMPAZINE) 10 MG tablet Take 1 tablet (10 mg total) by mouth every six (6) hours as needed (nausea or vomitting). (Patient not taking: Reported on 03/10/2021) 30 tablet 3   ??? rosuvastatin (CRESTOR) 20 MG tablet Take 1 tablet by mouth daily.       No current facility-administered medications for this visit.       No Known Allergies    Patient Active Problem List   Diagnosis   ??? Liposarcoma (CMS-HCC)     Reviewed and up to date in Epic.  Appropriateness of Therapy     Acute infections noted within Epic:  No active infections  Patient reported infection: None    Is medication and dose appropriate based on diagnosis and infection status? Yes    Prescription has been clinically reviewed: Yes    Baseline Quality of Life Assessment      How many days over the past month did your Liposarcoma  keep you from your normal activities? For example, brushing your teeth or getting up in the morning. 0    Financial Information     Medication Assistance provided: Prior Authorization    Anticipated copay of $105.93 / 21 days reviewed with patient. Verified delivery address.    Delivery Information     Scheduled delivery date: 03/31/21    Expected start date: 04/06/21    Medication will be delivered via UPS to the prescription address in [facility].  This shipment will not require a signature.      Explained the services we provide at [facility] and that each month we would call to set up refills.  Stressed importance of returning phone calls so that we could ensure they receive their medications in time each month.  Informed patient that we should be setting up refills 7-10 days prior to when they will run out of medication.  A pharmacist will reach out to perform a clinical assessment periodically.  Informed patient that a welcome packet, containing information about our pharmacy and other support services, a Notice of Privacy Practices, and a drug information handout will be sent.      The patient or caregiver noted above participated in the development of this care plan and knows that they can request review of or adjustments to the care plan at any time.      Patient or caregiver verbalized understanding of the above information as well as how to contact the pharmacy at [phone] option 4 with any questions/concerns.  The pharmacy is open Monday through Friday 8:30am-4:30pm.  A pharmacist is available 24/7 via pager to answer any clinical questions they may have.    Patient Specific Needs     - Does the patient have any physical, cognitive, or cultural barriers? No    - Does the patient have adequate living arrangements? (i.e. the ability to store and take their medication appropriately) Yes    - Did you identify any home environmental safety or security hazards? No    - Patient prefers to have medications discussed with  patient and wife     - Is the patient or caregiver able to read and understand education materials at a high school level or above? Yes    - Patient's primary language is  English     - Is the patient high risk? No    - Does the patient require physician intervention or other additional services (i.e. dietary/nutrition, smoking cessation, social work)? No      Larin Depaoli [facility] Specialty Pharmacist

## 2021-03-31 ENCOUNTER — Other Ambulatory Visit: Admit: 2021-03-31 | Discharge: 2021-03-31 | Payer: MEDICARE

## 2021-03-31 ENCOUNTER — Ambulatory Visit
Admit: 2021-03-31 | Discharge: 2021-03-31 | Payer: MEDICARE | Attending: Student in an Organized Health Care Education/Training Program | Primary: Student in an Organized Health Care Education/Training Program

## 2021-03-31 DIAGNOSIS — C499 Malignant neoplasm of connective and soft tissue, unspecified: Principal | ICD-10-CM

## 2021-03-31 DIAGNOSIS — C480 Malignant neoplasm of retroperitoneum: Secondary | ICD-10-CM | POA: Diagnosis not present

## 2021-03-31 LAB — CBC W/ AUTO DIFF
BASOPHILS ABSOLUTE COUNT: 0 10*9/L (ref 0.0–0.1)
BASOPHILS RELATIVE PERCENT: 0.4 %
EOSINOPHILS ABSOLUTE COUNT: 0 10*9/L (ref 0.0–0.5)
EOSINOPHILS RELATIVE PERCENT: 0 %
HEMATOCRIT: 36.1 % — ABNORMAL LOW (ref 39.0–48.0)
HEMOGLOBIN: 12.4 g/dL — ABNORMAL LOW (ref 12.9–16.5)
LYMPHOCYTES ABSOLUTE COUNT: 1.7 10*9/L (ref 1.1–3.6)
LYMPHOCYTES RELATIVE PERCENT: 16.6 %
MEAN CORPUSCULAR HEMOGLOBIN CONC: 34.5 g/dL (ref 32.0–36.0)
MEAN CORPUSCULAR HEMOGLOBIN: 28.8 pg (ref 25.9–32.4)
MEAN CORPUSCULAR VOLUME: 83.6 fL (ref 77.6–95.7)
MEAN PLATELET VOLUME: 6.4 fL — ABNORMAL LOW (ref 6.8–10.7)
MONOCYTES ABSOLUTE COUNT: 0.9 10*9/L — ABNORMAL HIGH (ref 0.3–0.8)
MONOCYTES RELATIVE PERCENT: 8.7 %
NEUTROPHILS ABSOLUTE COUNT: 7.6 10*9/L (ref 1.8–7.8)
NEUTROPHILS RELATIVE PERCENT: 74.3 %
PLATELET COUNT: 266 10*9/L (ref 150–450)
RED BLOOD CELL COUNT: 4.32 10*12/L (ref 4.26–5.60)
RED CELL DISTRIBUTION WIDTH: 15 % (ref 12.2–15.2)
WBC ADJUSTED: 10.2 10*9/L (ref 3.6–11.2)

## 2021-03-31 LAB — COMPREHENSIVE METABOLIC PANEL
ALBUMIN: 4 g/dL (ref 3.4–5.0)
ALKALINE PHOSPHATASE: 60 U/L (ref 46–116)
ALT (SGPT): 78 U/L — ABNORMAL HIGH (ref 10–49)
ANION GAP: 5 mmol/L (ref 5–14)
AST (SGOT): 49 U/L — ABNORMAL HIGH (ref <=34)
BILIRUBIN TOTAL: 0.4 mg/dL (ref 0.3–1.2)
BLOOD UREA NITROGEN: 14 mg/dL (ref 9–23)
BUN / CREAT RATIO: 8
CALCIUM: 8.6 mg/dL — ABNORMAL LOW (ref 8.7–10.4)
CHLORIDE: 106 mmol/L (ref 98–107)
CO2: 27 mmol/L (ref 20.0–31.0)
CREATININE: 1.66 mg/dL — ABNORMAL HIGH
EGFR CKD-EPI (2021) MALE: 45 mL/min/{1.73_m2} — ABNORMAL LOW (ref >=60)
GLUCOSE RANDOM: 103 mg/dL (ref 70–179)
POTASSIUM: 4.5 mmol/L (ref 3.4–4.8)
PROTEIN TOTAL: 6.4 g/dL (ref 5.7–8.2)
SODIUM: 138 mmol/L (ref 135–145)

## 2021-03-31 LAB — SLIDE REVIEW

## 2021-03-31 NOTE — Unmapped (Signed)
[facility] SARCOMA ONCOLOGY RETURN VISIT    Encounter Date: 03/31/2021  Patient Name: Jeremy Faulkner  Medical Record Number: [id number]    Referring Physician: Norm Parcel, MD  [address], [id number]  Phys[address] Office Building, 3rd Floor  [place],  [place] [address]    Primary Care Provider: ERIC Gelene Mink, MD    DIAGNOSIS:  No diagnosis found.      ASSESSMENT: 65 y.o. male with liposarcoma    RECOMMENDATIONS:    # De-differentiated liposarcoma of the right kidney, localized disease only:    We previously discussed rationale for adjuvant chemotherapy given his clearly high risk for recurrence (large tumor, high grade, Sarculator 7-year overall survival 41%, 7-year DFS 19%).  Planed for combination doxorubicin + ifosfamide (AIM),     Continue C2 AIM with D1 bolus doxorubicin (vs. 3 day regimen) to reflect consensus in [facility] sarcoma program (interruptions in chemotherapy due to ifosfamide may delay doxorubicin administration, and doxorubicin has most data for anti-tumor effect in this combination regimen).    - Admission for C2 AIM tomorrow  - RTC 3 weeks for toxicity check, labs, scans, and consideration of C3 AIM    I personally reviewed the medical records, pathology and laboratory results and viewed the imaging. All questions were answered to the patient's satisfaction and they voiced understanding and agreement with the plan. Pt has my contact information and is encouraged to reach out with any further questions.      REASON FOR CONSULTATION:  Jeremy Faulkner is a 65 y.o. male who is seen in consultation at the request of Norm Parcel, MD for evaluation of his soft tissue sarcoma.      HISTORY OF PRESENT ILLNESS:      65 yo male with history of recently resected right kidney liposarcoma presents for evaluation after surgical resection.  Here today with his wife.  Doing well, no new issues.  Tolerated C1 AIM well without nausea, or vomiting.  Did note a few days of reduced appetite and fatigue.  Denies bleeding, easy bruising, fevers, chills, abdominal pain.    Oncologic Timeline:  ?? Presents to PCP with nausea, vomiting and right-sided abdomen and flank pain, which had been intermittent in nature over the past 12 months  ?? 12/12/20: CT A/P with 10.7 x 9.9cm right posterior midpole renal lesion with necrotic center  ?? 01/02/21: Surgery, hand-assisted laparoscopic right nephrectomy,  pathology review diagnosed as dedifferentiated liposarcoma with rhabdomyosarcomatous differentiation involving kidney (13.5cm by report), 20-30% necrosis, mitoses are 17 per 10 HPF, By report, at [facility] the tumor stained positive for desmin, myogenin, MDM2 and MD2 RNA ISH, and negative for PAX8, CK903, AE1/3, CAM5.2 and GATA3.  ?? 01/15/21: Presents to urology with dyspnea and recent syncope x 4, spO2 94% on room air  ?? 01/15/21: CT A/P with sequela of right nephrectomy, 7.5 x 4.2 x 2.8cm gas and fluid collection in nephrectomy bed, small volume hemoperitoneum, moderate pneumoperitoneum, hypodense hepatic lesions are too small to further characterize  ?? 01/15/21: CT Angio Chest: No pulmonary lesions or nodules, no PE  ?? 03/11/21: C1 AIM (doxorubicin 75 with dexrazoxane, ifosfamide 2g/m2 x 3 days)  ?? 04/01/21: C2 AIM, anticipated       REVIEW OF SYSTEMS:  A comprehensive review of 12 systems was negative except for pertinent positives noted in HPI.    PMHx:  - HLD      PSHx:  - 2022 Right nephrectomy  - 1980s right calf tendon repair     Family Hx:  -  Mother, breast cancer in early 36s, diagnosed  - Father, leukemia at age 34 and nonHodgkins lymphoma  - One sister, healthy  - 2 children, both healthy    Social Hx:  - Lifelong nonsmoker  - Denies heavy EtOH use currently or in the past  - 1 glass of wine < 3 per week  - Retired Social research officer, government      ALLERGIES/MEDICATIONS:  Reviewed in Epic          PHYSICAL EXAM:   Vitals: BP 117/77  - Pulse 75  - Temp 36.9 ??C (98.4 ??F) (Temporal)  - Resp 16  - Ht 188 cm (6' 2)  - Wt 89.3 kg (196 lb 14.4 oz)  - SpO2 100%  - BMI 25.28 kg/m??   Gen: comfortable, NAD. ECOG PS 0  Eyes: EOMI, sclera anicteric  ENT:  MMM.   Neck: Supple, trachea midline  Lymph: No cervical, submandibular, or supraclavicular lymphadenopathy   CV: RRR no murmurs   Lungs: CTAB w/o rales, rhochi, or wheezing, good respiratory effort   GI: +BS, Soft, NT, ND, no masses appreciated   Ext: Warm and well perfused. No cyanosis, clubbing, or edema bilaterally   Skin: no rashes or lesions, port in place without surrounding erythema, well healed incision  Neuro: Moving all extremities, speech fluid. CNII-XII grossly intact.   Psych: mood euthymic and affect appropriate   MSK: No focal bony deformaties or tenderness  +++++++++++++++++++++++++++++++++++++++++++++++++++++++++++    PATHOLOGY:   Pathology was personally reviewed as described in the HPI, detailed in EPIC.    LABS:  Reviewed in EPIC      RADIOLOGY:  Imaging was personally viewed as summarized in the history (see above).  I agree with the findings/interpretation; details in EPIC.

## 2021-03-31 NOTE — Unmapped (Signed)
PT in lab for blood draws, RFA 25 G gauge needle used. Tolerated well. Sent for analysis. Obtained by AA RN

## 2021-03-31 NOTE — Unmapped (Signed)
PLAN FROM TODAY:    We discussed that you are tolerating treatment well and we will continue with your next treatment in hospital tomorrow  Take nausea medication before meals to see if this will improve your burping or indigestion with meals after chemo  Come back and see [place] in clinic in 3 weeks with repeat scans, labs, to discuss your third treatment      Support for cancer patients and their caregivers during the COVID-19 pandemic [email or web address]  Keeping cancer patients safe during the COVID-19 crisis [email or web address]-crisis/  +++++++++++++++++++++++++++++++++++++++++++++++++++++++++++  Your nurse navigator is:  Roseanne Reno, RN, BSN, OCN   Head & Neck/Sarcoma  Oncology Nurse Navigator  [email or web address]    For appointments & questions Monday through Friday 8 AM-- 5 PM   please call [phone] or Toll free [phone].    On Nights, Weekends and Holidays  Call [phone] and ask for the adult hematologist-oncologist on call.    [facility]  [address]  [place], [place] [address]  [email or web address]  Tel: [phone] / Toll Free [phone]  Fax: [phone]  +++++++++++++++++++++++++++++++++++++++++++++++++++++++++++

## 2021-04-01 ENCOUNTER — Encounter
Admit: 2021-04-01 | Discharge: 2021-04-04 | Disposition: A | Discharge status: Home / Self Care | Payer: MEDICARE | Attending: Hematology & Oncology | Admitting: Hematology & Oncology

## 2021-04-01 ENCOUNTER — Ambulatory Visit
Admit: 2021-04-01 | Discharge: 2021-04-04 | Disposition: A | Discharge status: Home / Self Care | Payer: MEDICARE | Admitting: Hematology & Oncology

## 2021-04-01 DIAGNOSIS — C499 Malignant neoplasm of connective and soft tissue, unspecified: Principal | ICD-10-CM

## 2021-04-01 DIAGNOSIS — Z803 Family history of malignant neoplasm of breast: Secondary | ICD-10-CM | POA: Diagnosis not present

## 2021-04-01 DIAGNOSIS — F32A Depression, unspecified: Secondary | ICD-10-CM | POA: Diagnosis not present

## 2021-04-01 DIAGNOSIS — Z5111 Encounter for antineoplastic chemotherapy: Secondary | ICD-10-CM | POA: Diagnosis not present

## 2021-04-01 DIAGNOSIS — Z20822 Contact with and (suspected) exposure to covid-19: Secondary | ICD-10-CM | POA: Diagnosis not present

## 2021-04-01 DIAGNOSIS — Z905 Acquired absence of kidney: Secondary | ICD-10-CM | POA: Diagnosis not present

## 2021-04-01 DIAGNOSIS — E785 Hyperlipidemia, unspecified: Secondary | ICD-10-CM | POA: Diagnosis not present

## 2021-04-01 DIAGNOSIS — C641 Malignant neoplasm of right kidney, except renal pelvis: Secondary | ICD-10-CM | POA: Diagnosis not present

## 2021-04-01 DIAGNOSIS — R69 Illness, unspecified: Secondary | ICD-10-CM | POA: Diagnosis not present

## 2021-04-01 DIAGNOSIS — N179 Acute kidney failure, unspecified: Secondary | ICD-10-CM | POA: Diagnosis not present

## 2021-04-01 LAB — URINALYSIS
BACTERIA: NONE SEEN /HPF
BILIRUBIN UA: NEGATIVE
BLOOD UA: NEGATIVE
GLUCOSE UA: NEGATIVE
KETONES UA: NEGATIVE
LEUKOCYTE ESTERASE UA: NEGATIVE
NITRITE UA: NEGATIVE
PH UA: 6 (ref 5.0–9.0)
RBC UA: <1 /HPF (ref <=3)
SPECIFIC GRAVITY UA: 1.023 (ref 1.003–1.030)
SQUAMOUS EPITHELIAL: <1 /HPF (ref 0–5)
UROBILINOGEN UA: <2
WBC UA: <1 /HPF (ref <=2)

## 2021-04-01 MED ADMIN — sodium chloride (NS) 0.9 % infusion: 125 mL/h | INTRAVENOUS | @ 23:00:00

## 2021-04-01 MED ADMIN — sodium chloride (NS) 0.9 % infusion: 250 mL/h | INTRAVENOUS | @ 18:00:00 | Stop: 2021-04-01

## 2021-04-01 NOTE — Unmapped (Signed)
Hello,    Please scheduled patient Jeremy Faulkner on 04/22/21 for a SCHED ADM THROUGH INF at  10:00 AM     ?? Reason for Admission: Scheduled chemotherapy   ?? Primary Diagnosis:  Encounter for antineoplastic chemotherapy for sarcoma  ?? Primary Diagnosis ICD-10 Code:  Z51.11 & c49.9  ?? Expected length of stay: 5 Days   ?? CPT Code:  16109   ?? CPT Code Description: Under Injection and Intravenous Infusion Chemotherapy and Other Highly Complex Drug or Highly Complex Biologic Agent Administration   ?? Treating Attending: Dorna Mai  ?? Need for PICC placement in Infusion? No    Infusion Scheduling: Please notify the patient of the appointment date and time once scheduled. Please document this information within this encounter and then route to the navigator prior to closing the encounter.       Scheduled Admissions Assessment        Assessment  Response  Intervention    Type of insurance  Medicare Financial counselor referral is not warranted this time.     Reliable Trasportation  Yes Referral for Social Work is not warranted this time.   Central Line Access  Yes Patient has line access and does not require an intervention at this time     Other Pertinent Information:  N/A    Provider:  1. Please place order for PICC  no   Order name: Insert PICC line by Vascular Access (must have the house for  outpatient designation)  2.Please place order for [facility] plan for infusion center     Nurse:   Route this smart phrase to Provider (which signals need to place and sign chemotherapy orders, and to place order for PICC line (if applicable) and line care plan in the infusion center on day of admission)    Thank you,  Lajuana Matte

## 2021-04-02 DIAGNOSIS — E785 Hyperlipidemia, unspecified: Secondary | ICD-10-CM | POA: Diagnosis not present

## 2021-04-02 DIAGNOSIS — N179 Acute kidney failure, unspecified: Secondary | ICD-10-CM | POA: Diagnosis not present

## 2021-04-02 DIAGNOSIS — C499 Malignant neoplasm of connective and soft tissue, unspecified: Secondary | ICD-10-CM | POA: Diagnosis not present

## 2021-04-02 LAB — COMPREHENSIVE METABOLIC PANEL
ALBUMIN: 3.2 g/dL — ABNORMAL LOW (ref 3.4–5.0)
ALKALINE PHOSPHATASE: 52 U/L (ref 46–116)
ALT (SGPT): 53 U/L — ABNORMAL HIGH (ref 10–49)
ANION GAP: <1 mmol/L — ABNORMAL LOW (ref 5–14)
AST (SGOT): 34 U/L (ref <=34)
BILIRUBIN TOTAL: 0.2 mg/dL — ABNORMAL LOW (ref 0.3–1.2)
BLOOD UREA NITROGEN: 15 mg/dL (ref 9–23)
BUN / CREAT RATIO: 9
CALCIUM: 8 mg/dL — ABNORMAL LOW (ref 8.7–10.4)
CHLORIDE: 115 mmol/L — ABNORMAL HIGH (ref 98–107)
CO2: 25 mmol/L (ref 20.0–31.0)
CREATININE: 1.58 mg/dL — ABNORMAL HIGH
EGFR CKD-EPI (2021) MALE: 48 mL/min/{1.73_m2} — ABNORMAL LOW (ref >=60)
GLUCOSE RANDOM: 105 mg/dL (ref 70–179)
POTASSIUM: 4.5 mmol/L (ref 3.4–4.8)
PROTEIN TOTAL: 5.2 g/dL — ABNORMAL LOW (ref 5.7–8.2)
SODIUM: 140 mmol/L (ref 135–145)

## 2021-04-02 LAB — CBC W/ AUTO DIFF
BASOPHILS ABSOLUTE COUNT: 0 10*9/L (ref 0.0–0.1)
BASOPHILS RELATIVE PERCENT: 0.8 %
EOSINOPHILS ABSOLUTE COUNT: 0 10*9/L (ref 0.0–0.5)
EOSINOPHILS RELATIVE PERCENT: 0.1 %
HEMATOCRIT: 30.4 % — ABNORMAL LOW (ref 39.0–48.0)
HEMOGLOBIN: 10.5 g/dL — ABNORMAL LOW (ref 12.9–16.5)
LYMPHOCYTES ABSOLUTE COUNT: 0.6 10*9/L — ABNORMAL LOW (ref 1.1–3.6)
LYMPHOCYTES RELATIVE PERCENT: 9.7 %
MEAN CORPUSCULAR HEMOGLOBIN CONC: 34.6 g/dL (ref 32.0–36.0)
MEAN CORPUSCULAR HEMOGLOBIN: 29 pg (ref 25.9–32.4)
MEAN CORPUSCULAR VOLUME: 83.7 fL (ref 77.6–95.7)
MEAN PLATELET VOLUME: 7 fL (ref 6.8–10.7)
MONOCYTES ABSOLUTE COUNT: 0.2 10*9/L — ABNORMAL LOW (ref 0.3–0.8)
MONOCYTES RELATIVE PERCENT: 3.1 %
NEUTROPHILS ABSOLUTE COUNT: 5.5 10*9/L (ref 1.8–7.8)
NEUTROPHILS RELATIVE PERCENT: 86.3 %
PLATELET COUNT: 224 10*9/L (ref 150–450)
RED BLOOD CELL COUNT: 3.63 10*12/L — ABNORMAL LOW (ref 4.26–5.60)
RED CELL DISTRIBUTION WIDTH: 15.1 % (ref 12.2–15.2)
WBC ADJUSTED: 6.4 10*9/L (ref 3.6–11.2)

## 2021-04-02 LAB — BASIC METABOLIC PANEL
ANION GAP: <1 mmol/L — ABNORMAL LOW (ref 5–14)
BLOOD UREA NITROGEN: 15 mg/dL (ref 9–23)
BUN / CREAT RATIO: 9
CALCIUM: 8 mg/dL — ABNORMAL LOW (ref 8.7–10.4)
CHLORIDE: 115 mmol/L — ABNORMAL HIGH (ref 98–107)
CO2: 25 mmol/L (ref 20.0–31.0)
CREATININE: 1.58 mg/dL — ABNORMAL HIGH
EGFR CKD-EPI (2021) MALE: 48 mL/min/{1.73_m2} — ABNORMAL LOW (ref >=60)
GLUCOSE RANDOM: 105 mg/dL (ref 70–179)
POTASSIUM: 4.5 mmol/L (ref 3.4–4.8)
SODIUM: 140 mmol/L (ref 135–145)

## 2021-04-02 LAB — HEPATIC FUNCTION PANEL
ALBUMIN: 3.2 g/dL — ABNORMAL LOW (ref 3.4–5.0)
ALKALINE PHOSPHATASE: 52 U/L (ref 46–116)
ALT (SGPT): 53 U/L — ABNORMAL HIGH (ref 10–49)
AST (SGOT): 34 U/L (ref <=34)
BILIRUBIN DIRECT: <0.1 mg/dL (ref 0.00–0.30)
BILIRUBIN TOTAL: 0.2 mg/dL — ABNORMAL LOW (ref 0.3–1.2)
PROTEIN TOTAL: 5.2 g/dL — ABNORMAL LOW (ref 5.7–8.2)

## 2021-04-02 LAB — URINALYSIS
BACTERIA: NONE SEEN /HPF
BILIRUBIN UA: NEGATIVE
BLOOD UA: NEGATIVE
GLUCOSE UA: NEGATIVE
KETONES UA: 40 — AB
LEUKOCYTE ESTERASE UA: NEGATIVE
NITRITE UA: NEGATIVE
PH UA: 7 (ref 5.0–9.0)
PROTEIN UA: NEGATIVE
RBC UA: <1 /HPF (ref <=3)
SPECIFIC GRAVITY UA: 1.008 (ref 1.003–1.030)
SQUAMOUS EPITHELIAL: <1 /HPF (ref 0–5)
UROBILINOGEN UA: <2
WBC UA: <1 /HPF (ref <=2)

## 2021-04-02 LAB — PHOSPHORUS: PHOSPHORUS: 1.7 mg/dL — ABNORMAL LOW (ref 2.4–5.1)

## 2021-04-02 LAB — MAGNESIUM: MAGNESIUM: 1.9 mg/dL (ref 1.6–2.6)

## 2021-04-02 LAB — PROTIME-INR
INR: 1.2
PROTIME: 13.7 s — ABNORMAL HIGH (ref 9.8–12.8)

## 2021-04-02 MED ADMIN — ondansetron (ZOFRAN) tablet 24 mg: 24 mg | ORAL | @ 01:00:00 | Stop: 2021-04-04

## 2021-04-02 MED ADMIN — sodium chloride (NS) 0.9 % flush 10 mL: 10 mL | INTRAVENOUS | @ 02:00:00

## 2021-04-02 MED ADMIN — potassium & sodium phosphates 250mg (PHOS-NAK/NEUTRA PHOS) packet 2 packet: 2 | ORAL | @ 20:00:00 | Stop: 2021-04-02

## 2021-04-02 MED ADMIN — sodium chloride (NS) 0.9 % flush 10 mL: 10 mL | INTRAVENOUS | @ 14:00:00

## 2021-04-02 MED ADMIN — OLANZapine (ZyPREXA) tablet 5 mg: 5 mg | ORAL | @ 01:00:00 | Stop: 2021-04-01

## 2021-04-02 MED ADMIN — aprepitant (EMEND) capsule 125 mg: 125 mg | ORAL | @ 01:00:00 | Stop: 2021-04-01

## 2021-04-02 MED ADMIN — mesna (MESNEX) 1,421 mg in sodium chloride (NS) 0.9 % 50 mL IVPB: 667 mg/m2 | INTRAVENOUS | @ 09:00:00 | Stop: 2021-04-05

## 2021-04-02 MED ADMIN — sodium chloride (NS) 0.9 % flush 10 mL: 10 mL | INTRAVENOUS | @ 14:00:00 | Stop: 2021-04-02

## 2021-04-02 MED ADMIN — dexrazoxane HCL (ZINECARD) 1,599 mg in lactated Ringers 373.1 mL IVPB: 750 mg/m2 | INTRAVENOUS | @ 02:00:00 | Stop: 2021-04-01

## 2021-04-02 MED ADMIN — DOXOrubicin (ADRIAMYCIN) syringe: 75 mg/m2 | INTRAVENOUS | @ 02:00:00 | Stop: 2021-04-01

## 2021-04-02 MED ADMIN — mesna (MESNEX) 1,421 mg in sodium chloride (NS) 0.9 % 50 mL IVPB: 667 mg/m2 | INTRAVENOUS | @ 03:00:00 | Stop: 2021-04-04

## 2021-04-02 MED ADMIN — dexAMETHasone (DECADRON) tablet 12 mg: 12 mg | ORAL | @ 01:00:00 | Stop: 2021-04-04

## 2021-04-02 MED ADMIN — mesna (MESNEX) 1,421 mg in sodium chloride (NS) 0.9 % 50 mL IVPB: 667 mg/m2 | INTRAVENOUS | @ 14:00:00 | Stop: 2021-04-05

## 2021-04-02 MED ADMIN — ifosfamide (IFEX) 4.26 g in sodium chloride (NS) 0.9 % 500 mL IVPB: 2 g/m2 | INTRAVENOUS | @ 03:00:00 | Stop: 2021-04-04

## 2021-04-02 NOTE — Unmapped (Addendum)
Jeremy Faulkner is a 65 y.o. 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     De-differentiated Liposarcoma: Followed by Dr. Dorna Mai at [facility]. Admitted for planned cycle 2 adjuvant chemotherapy with AIM. Patient tolerated treatment well, he did not experience any nausea, vomiting, or neurological concerns. Daily urinalysis did not show evidence of hemorrhagic cystitis. Discussed importance of GCSF administration after discharge. Monitored for electrolyte abnormalities on daily labs and repleted per [facility] guidelines. Monitored for cytopenias with transfusion goals Hgb>7, Plt>10. Patient did not need any transfusions while admitted.                                                                                                                                     Chemo Plan  Date  9/21              Day  1  2  3  4       Doxorubicin 75mg /m2 x            Ifosfamide2.0g/m2 x  x  x  x     Mesna 800mg /m2 x x x x                         HLD: On Rosuvastatin 20mg  every day at home. Held on admission but restarted on discharge.     Acute Kidney Injury: Patient with worsening Serum Creatinine since 6/25 in setting of right sided nephrectomy secondary to liposarcoma. Serum Creatinine 1.66 on admission. Ifosphamide dose reduced. Educated to avoid other nephrotoxic medications at home.      Health Maintenance  - Discussed [employer] for COVID-19 prevention, provided information sheet to patient  - Pt to review EUA information with outpatient oncologist

## 2021-04-02 NOTE — Unmapped (Signed)
Oncology (MEDO) Progress Note    Assessment & Plan:   Jeremy Faulkner is a 65 y.o. 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 *      De-differentiated??Liposarcoma: Followed by Dr. Dorna Mai at [facility]. De-differentiated liposarcoma of right kidney, localized disease only. S/p right nephrectomy June 2022. Recent ECHO with LVEF 55-60%.  Tolerated cycle 1 (day 1 = 8/31) without complication. Doxorubicin again dosed as one-day push (vs. 3 day regimen) to reflect consensus in [facility] sarcoma program to mitigate interruptions in chemotherapy due to ifosfamide may delay doxorubicin. Ifosfamide dose reduced due to CKD and sp nephrectomy.   -Anti-emetics: Decadron D1-4; Ondansetron D1-4; Aprepitant D1-4; Zyprexa HS D1-3 ??  -discharge with GCSF for self-administration  -follow-up with Dr. Dorna Mai 04/21/21   -daily UA  - anticipated discharge 9/24 after final Mesna dose                                                                                                                Chemo Plan  Date?? 9/21  ?? ?? ?? ?? ??   Day  1  2  3  4   5??   Doxorubicin??75mg /m2 x  ?? ?? ?? ??   Ifosfamide2.0g/m2 x  x  x?? x ??   Mesna 800mg /m2 x x x x ??   ?? ?? ?? ?? ?? ??   ??  ??  HLD: On Rosuvastatin 20mg  every day at home. Held on admission but can restart on discharge.  ??  Acute Kidney Injury: Patient with worsening Serum Creatinine since 6/25 in setting of right sided nephrectomy secondary to liposarcoma. Serum Creatinine 1.66 on admission. Ifosphamide dose reduced. Educated to avoid other nephrotoxic medications at home.   -avoid nephrotoxic medications   ??  Impending Electrolyte Abnormality Secondary to Chemotherapy and/or IV Fluids  -Daily Electrolyte monitoring  -Replete per [facility] guidelines.??  ??  Impending Pancytopenia secondary to chemotherapy:??  - Transfuse 1 unit of pRBCs for hgb >7  - Transfuse 1 unit plt for plts >10K??    Daily Checklist:  Diet: Regular Diet  DVT PPx: Lovenox 40mg  q24h  Electrolytes: Replete Potassium to >/= 3.6 and Magnesium to >/= 1.8  Code Status: Full Code  Dispo: EDD 9/24    Team Contact Information:   Primary Team: Oncology (MEDO)  Primary Resident: Ricarda Frame, MD  Resident's Pager: [phone] (Oncology Intern - Cliffton Asters)    Interval History:   No acute events overnight.    Patient feels well this morning. He denies any acute concerns. No chest pain, shortness of breath, nausea, vomiting, headache, vision changes, numbness/tingling.    Discussed plan and updated patient as well as family.    All other systems were reviewed and are negative except as noted in the HPI    Objective:   Temp:  [36.3 ??C (97.3 ??  F)-36.7 ??C (98 ??F)] 36.3 ??C (97.3 ??F)  Heart Rate:  [65-90] 75  Resp:  [16-18] 18  BP: (96-129)/(56-72) 115/72  SpO2:  [95 %-98 %] 98 %    Gen: WDWN  in NAD, answers questions appropriately  Eyes: Sclera anicteric, EOMI, PERRLA,  HENT: atraumatic, normocephalic, MMM. OP w/o erythema or exudate   Neck: no cervical lymphadenopathy or thyromegaly, no JVD  Heart: RRR, S1, S2, no M/R/G, no chest wall tenderness  Lungs: CTAB, no crackles or wheezes, no use of accessory muscles  Abdomen: Normoactive bowel sounds, soft, NTND, no rebound/guarding, no hepatosplenomegaly  Extremities: no clubbing, cyanosis, or edema: pulses are +2 in bilateral upper and lower extremities  Neuro: CN II-XI grossly intact, normal cerebellar function, normal gait. No focal deficits.  Skin:  No rashes, lesions on clothed exam  Psych: Alert, oriented, normal mood and affect.     Labs/Studies: Labs and Studies from the last 24hrs per EMR and Reviewed

## 2021-04-02 NOTE — Unmapped (Signed)
Case Management Brief Assessment      General: Patient admitted for cycle 2 of AIM chemotherapy for liposarcoma. Recently discharged 03/14/2021 following cycle 1.        Extended Emergency Contact Information  Primary Emergency Contact: Holness,Karen  Mobile Phone: [phone]  Relation: Spouse      Financial Information: No financial needs identified.            Discharge Needs: No CM needs anticipated at discharge              Discharge Plan: Home with self care       Estimated Discharge Date: 04/05/2021             Additional Information:      Social Determinants of Health     Tobacco Use: Low Risk    ??? Smoking Tobacco Use: Never Smoker   ??? Smokeless Tobacco Use: Never Used   Alcohol Use: Not on file   Financial Resource Strain: Low Risk    ??? Difficulty of Paying Living Expenses: Not very hard   Food Insecurity: No Food Insecurity   ??? Worried About Running Out of Food in the Last Year: Never true   ??? Ran Out of Food in the Last Year: Never true   Transportation Needs: No Transportation Needs   ??? Lack of Transportation (Medical): No   ??? Lack of Transportation (Non-Medical): No   Physical Activity: Not on file   Stress: Not on file   Social Connections: Not on file   Intimate Partner Violence: Not on file   Depression: Not on file   Housing/Utilities: Low Risk    ??? Within the past 12 months, have you ever stayed: outside, in a car, in a tent, in an overnight shelter, or temporarily in someone else's home (i.e. couch-surfing)?: No   ??? Are you worried about losing your housing?: No   ??? Within the past 12 months, have you been unable to get utilities (heat, electricity) when it was really needed?: No   Substance Use: Not on file   Health Literacy: Not on file       Predictive Model Details          22% (Medium)  Factor Value    Calculated 04/02/2021 08:09 22% Number of active Rx orders 35    [place] Risk of Unplanned Readmission Model 10% Diagnosis of cancer present     9% Active antipsychotic Rx order present     9% ECG/EKG order present in last 6 months     8% Latest calcium low (8.0 mg/dL)     6% Imaging order present in last 6 months     5% Latest hemoglobin low (10.5 g/dL)     5% Phosphorous result present     5% Age 65     4% Number of hospitalizations in last year 1     4% Active anticoagulant Rx order present     4% Active corticosteroid Rx order present     4% Latest creatinine high (1.58 mg/dL)     2% Charlson Comorbidity Index 2     2% Future appointment scheduled     1% Active ulcer medication Rx order present     1% Current length of stay 0.689 days

## 2021-04-02 NOTE — Unmapped (Signed)
Oncology (MEDO) History & Physical    Assessment & Plan:   Jeremy Faulkner is a 65 y.o. 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 *    De-differentiated Liposarcoma: Followed by Dr. Dorna Mai at [facility]. De-differentiated liposarcoma of right kidney, localized disease only. S/p right nephrectomy June 2022.  Recent ECHO with LVEF 55-60%.  Tolerated cycle 1 (day 1 = 8/31) without complication.  Doxorubicin again dosed as one-day push (vs. 3 day regimen) to reflect consensus in [facility] sarcoma program to mitigate interruptions in chemotherapy due to ifosfamide may delay doxorubicin. Ifosfamide dose reduced due to CKD and sp nephrectomy.   -Anti-emetics: Decadron D1-4; Ondansetron D1-4; Aprepitant D1-4; Zyprexa HS D1-3 ??  -discharge with GCSF for self-administration  -follow-up with Dr. Dorna Mai 04/21/21   -daily UA               Chemo Plan  Date?? 9/21  ?? ?? ?? ?? ??   Day  1  2  3  4   5??   Doxorubicin??75mg /m2 x  ?? ?? ?? ??   Ifosfamide2.0g/m2 x  x  x?? x ??   [place] 800mg /m2 x x x x ??   ?? ?? ?? ?? ?? ??       HLD: On Rosuvastatin 20mg  every day at home. Held on admission but can restart on discharge.  ??  Acute Kidney Injury: Patient with worsening Serum Creatinine since 6/25 in setting of right sided nephrectomy secondary to liposarcoma. Serum Creatinine 1.66 on admission. Ifosphamide dose reduced. Educated to avoid other nephrotoxic medications at home.   -avoid nephrotoxic medications     Impending Electrolyte Abnormality Secondary to Chemotherapy and/or IV Fluids  -Daily Electrolyte monitoring  -Replete per [facility] guidelines.??  ??  Impending Pancytopenia secondary to chemotherapy:   - Transfuse 1 unit of pRBCs for hgb >7  - Transfuse 1 unit plt for plts >10K     Daily Checklist:  Diet: Regular Diet  DVT PPx: Lovenox 40mg  q24h  Electrolytes: Replete Potassium to >/= 3.6 and Magnesium to >/= 1.8  Code Status: Full Code    Chief Concern:   No Principal Problem: There is no principal problem currently on the Problem List. Please update the Problem List and refresh.    Subjective:   HPI:  Jeremy Faulkner is a 65 y.o. 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 Tolerated cycle 1 well. He noted three days of burping with food intake after discontinuing Zyprexa on last cycle that self-resolved. No other nausea, vomiting, diarrhea. Maintaining good PO intake. Having regular bowel movements with home coalesce. No abd pain. No change in urination including decreased amount, dysuria. Denies any skin changes/lesions/bruises.        Allergies:  Patient has no known allergies.    Medications:   Prior to Admission medications    Medication Dose, Route, Frequency   docusate sodium (COLACE) 100 MG capsule 100 mg, Oral, Daily (standard)   empty container (SHARPS CONTAINER) Misc Use as directed   OLANZapine (ZYPREXA) 5 MG tablet 5 mg, Oral, Nightly, Take nightly for 3 days following chemotherapy to prevent nausea.  Patient not taking: No sig reported   ondansetron (ZOFRAN) 8 MG tablet 8 mg, Oral, Every 8 hours PRN  Patient not taking: No sig reported   pegfilgrastim-bmez (ZIEXTENZO) 6 mg/0.6 mL injection Inject 1 syringe (0.66mL) under the skin once every 21 days. Inject 24-72h after last chemotherapy dose on day 5.  Patient not taking: No sig reported   prochlorperazine (COMPAZINE) 10 MG tablet 10 mg, Oral, Every 6 hours PRN  Patient not taking: No sig reported   rosuvastatin (CRESTOR) 20 MG tablet 1 tablet, Oral, Daily (standard)       Medical History:  No past medical history on file.    Surgical History:  Past Surgical History:   Procedure Laterality Date   ??? IR INSERT PORT AGE GREATER THAN 5 YRS  03/09/2021    IR INSERT PORT AGE GREATER THAN 5 YRS 03/09/2021 Trude Mcburney, MD IMG [facility] Family History:   No family history on file.    Social History:  The patient lives with family    Social History     Tobacco Use   ??? Smoking status: Never Smoker   ??? Smokeless tobacco: Never Used        Review of Systems:  10 systems were reviewed and are negative unless otherwise mentioned in the HPI    Objective:   Physical Exam:  Temp:  [36.5 ??C (97.7 ??F)-36.7 ??C (98 ??F)] 36.7 ??C (98 ??F)  Heart Rate:  [75-77] 75  Resp:  [16] 16  BP: (127-129)/(65-66) 129/65  SpO2:  [98 %] 98 %    Gen: WDWN  in NAD, answers questions appropriately  Eyes: Sclera anicteric, EOMI, PERRLA,  HENT: atraumatic, normocephalic, MMM. OP w/o erythema or exudate   Neck: no cervical lymphadenopathy or thyromegaly, no JVD  Heart: RRR, S1, S2, no M/R/G, no chest wall tenderness  Lungs: CTAB, no crackles or wheezes, no use of accessory muscles  Abdomen: Normoactive bowel sounds, soft, NTND, no rebound/guarding, no hepatosplenomegaly  Extremities: no clubbing, cyanosis, or edema: pulses are +2 in bilateral upper and lower extremities  Neuro: CN II-XI grossly intact, normal cerebellar function, normal gait. No focal deficits.  Skin:  No rashes, lesions on clothed exam  Psych: Alert, oriented, normal mood and affect.     Labs/Studies/Imaging:  Labs, Studies, Imaging from the last 24hrs per EMR and personally reviewed

## 2021-04-02 NOTE — Unmapped (Signed)
Pt admitted for cycle 2 of AIM. Day 1 started at 2200. Tolerating treatment without adverse affects overnight. Soft BP overnight 96/57. Pt asymptomatic. Provider notified. IVF @ 153mL/hr. Voiding well. Educated on accurate monitoring of I&O. Family at bedside  Problem: Adult Inpatient Plan of Care  Goal: Plan of Care Review  Outcome: Progressing  Goal: Patient-Specific Goal (Individualized)  04/02/2021 0502 by Lynda Rainwater, RN  Flowsheets (Taken 04/02/2021 0502)  Individualized Care Needs: cluster care when able.  Anxieties, Fears or Concerns: anxious regarding any potential side effects related to second cycle of chemo  04/02/2021 0459 by Lynda Rainwater, RN  Outcome: Progressing  Goal: Absence of Hospital-Acquired Illness or Injury  Outcome: Progressing  Intervention: Identify and Manage Fall Risk  Recent Flowsheet Documentation  Taken 04/01/2021 2000 by Lynda Rainwater, RN  Safety Interventions:   family at bedside   low bed   nonskid shoes/slippers when out of bed   chemotherapeutic agent precautions  Intervention: Prevent and Manage VTE (Venous Thromboembolism) Risk  Recent Flowsheet Documentation  Taken 04/01/2021 2000 by Lynda Rainwater, RN  Activity Management: ambulated to bathroom  Goal: Optimal Comfort and Wellbeing  Outcome: Progressing  Goal: Readiness for Transition of Care  Outcome: Progressing  Goal: Rounds/Family Conference  Outcome: Progressing

## 2021-04-03 DIAGNOSIS — E785 Hyperlipidemia, unspecified: Secondary | ICD-10-CM | POA: Diagnosis not present

## 2021-04-03 DIAGNOSIS — N179 Acute kidney failure, unspecified: Secondary | ICD-10-CM | POA: Diagnosis not present

## 2021-04-03 DIAGNOSIS — C499 Malignant neoplasm of connective and soft tissue, unspecified: Secondary | ICD-10-CM | POA: Diagnosis not present

## 2021-04-03 LAB — CBC W/ AUTO DIFF
BASOPHILS ABSOLUTE COUNT: 0 10*9/L (ref 0.0–0.1)
BASOPHILS RELATIVE PERCENT: 0.1 %
EOSINOPHILS ABSOLUTE COUNT: 0 10*9/L (ref 0.0–0.5)
EOSINOPHILS RELATIVE PERCENT: 0 %
HEMATOCRIT: 29.5 % — ABNORMAL LOW (ref 39.0–48.0)
HEMOGLOBIN: 10.1 g/dL — ABNORMAL LOW (ref 12.9–16.5)
LYMPHOCYTES ABSOLUTE COUNT: 0.4 10*9/L — ABNORMAL LOW (ref 1.1–3.6)
LYMPHOCYTES RELATIVE PERCENT: 4.1 %
MEAN CORPUSCULAR HEMOGLOBIN CONC: 34.2 g/dL (ref 32.0–36.0)
MEAN CORPUSCULAR HEMOGLOBIN: 28.6 pg (ref 25.9–32.4)
MEAN CORPUSCULAR VOLUME: 83.7 fL (ref 77.6–95.7)
MEAN PLATELET VOLUME: 7.3 fL (ref 6.8–10.7)
MONOCYTES ABSOLUTE COUNT: 0.5 10*9/L (ref 0.3–0.8)
MONOCYTES RELATIVE PERCENT: 4.6 %
NEUTROPHILS ABSOLUTE COUNT: 9.6 10*9/L — ABNORMAL HIGH (ref 1.8–7.8)
NEUTROPHILS RELATIVE PERCENT: 91.2 %
PLATELET COUNT: 202 10*9/L (ref 150–450)
RED BLOOD CELL COUNT: 3.52 10*12/L — ABNORMAL LOW (ref 4.26–5.60)
RED CELL DISTRIBUTION WIDTH: 15.3 % — ABNORMAL HIGH (ref 12.2–15.2)
WBC ADJUSTED: 10.5 10*9/L (ref 3.6–11.2)

## 2021-04-03 LAB — URINALYSIS
BACTERIA: NONE SEEN /HPF
BILIRUBIN UA: NEGATIVE
BLOOD UA: NEGATIVE
GLUCOSE UA: NEGATIVE
KETONES UA: 150 — AB
LEUKOCYTE ESTERASE UA: NEGATIVE
NITRITE UA: NEGATIVE
PH UA: 5.5 (ref 5.0–9.0)
PROTEIN UA: NEGATIVE
RBC UA: <1 /HPF (ref <=3)
SPECIFIC GRAVITY UA: 1.009 (ref 1.003–1.030)
SQUAMOUS EPITHELIAL: <1 /HPF (ref 0–5)
UROBILINOGEN UA: <2
WBC UA: <1 /HPF (ref <=2)

## 2021-04-03 LAB — BASIC METABOLIC PANEL
ANION GAP: 6 mmol/L (ref 5–14)
BLOOD UREA NITROGEN: 20 mg/dL (ref 9–23)
BUN / CREAT RATIO: 14
CALCIUM: 7.8 mg/dL — ABNORMAL LOW (ref 8.7–10.4)
CHLORIDE: 116 mmol/L — ABNORMAL HIGH (ref 98–107)
CO2: 20 mmol/L (ref 20.0–31.0)
CREATININE: 1.44 mg/dL — ABNORMAL HIGH
EGFR CKD-EPI (2021) MALE: 54 mL/min/{1.73_m2} — ABNORMAL LOW (ref >=60)
GLUCOSE RANDOM: 116 mg/dL (ref 70–179)
POTASSIUM: 4.6 mmol/L (ref 3.4–4.8)
SODIUM: 142 mmol/L (ref 135–145)

## 2021-04-03 LAB — PHOSPHORUS: PHOSPHORUS: 3.6 mg/dL (ref 2.4–5.1)

## 2021-04-03 LAB — MAGNESIUM: MAGNESIUM: 1.8 mg/dL (ref 1.6–2.6)

## 2021-04-03 MED ADMIN — mesna (MESNEX) 1,421 mg in sodium chloride (NS) 0.9 % 50 mL IVPB: 667 mg/m2 | INTRAVENOUS | @ 09:00:00 | Stop: 2021-04-05

## 2021-04-03 MED ADMIN — mesna (MESNEX) 1,421 mg in sodium chloride (NS) 0.9 % 50 mL IVPB: 667 mg/m2 | INTRAVENOUS | @ 03:00:00 | Stop: 2021-04-04

## 2021-04-03 MED ADMIN — docusate sodium (COLACE) capsule 100 mg: 100 mg | ORAL | @ 03:00:00

## 2021-04-03 MED ADMIN — ifosfamide (IFEX) 4.26 g in sodium chloride (NS) 0.9 % 500 mL IVPB: 2 g/m2 | INTRAVENOUS | @ 03:00:00 | Stop: 2021-04-04

## 2021-04-03 MED ADMIN — dexAMETHasone (DECADRON) tablet 12 mg: 12 mg | ORAL | @ 03:00:00 | Stop: 2021-04-04

## 2021-04-03 MED ADMIN — OLANZapine (ZyPREXA) tablet 5 mg: 5 mg | ORAL | @ 03:00:00 | Stop: 2021-04-04

## 2021-04-03 MED ADMIN — sodium chloride (NS) 0.9 % infusion: 125 mL/h | INTRAVENOUS

## 2021-04-03 MED ADMIN — sodium chloride (NS) 0.9 % flush 10 mL: 10 mL | INTRAVENOUS | @ 03:00:00

## 2021-04-03 MED ADMIN — enoxaparin (LOVENOX) syringe 40 mg: 40 mg | SUBCUTANEOUS | @ 03:00:00

## 2021-04-03 MED ADMIN — ondansetron (ZOFRAN) tablet 24 mg: 24 mg | ORAL | @ 03:00:00 | Stop: 2021-04-04

## 2021-04-03 MED ADMIN — baclofen (LIORESAL) tablet 5 mg: 5 mg | ORAL | @ 08:00:00

## 2021-04-03 MED ADMIN — sodium chloride (NS) 0.9 % flush 10 mL: 10 mL | INTRAVENOUS | @ 14:00:00

## 2021-04-03 MED ADMIN — aprepitant (EMEND) capsule 80 mg: 80 mg | ORAL | @ 03:00:00 | Stop: 2021-04-04

## 2021-04-03 MED ADMIN — sodium chloride (NS) 0.9 % flush 10 mL: 10 mL | INTRAVENOUS | @ 03:00:00 | Stop: 2021-04-02

## 2021-04-03 MED ADMIN — mesna (MESNEX) 1,421 mg in sodium chloride (NS) 0.9 % 50 mL IVPB: 667 mg/m2 | INTRAVENOUS | @ 14:00:00 | Stop: 2021-04-05

## 2021-04-03 MED ADMIN — sodium chloride (NS) 0.9 % infusion: 125 mL/h | INTRAVENOUS | @ 09:00:00

## 2021-04-03 NOTE — Unmapped (Signed)
Day 2 of AIM chemo. Tolerating well. Pt c/o constipation. Colace given per order. Also c/o progressive hiccups, similar to how he has had them during previous cycle. Provider notified and baclofen given with reported improvement in symptoms. Voiding well. Receiving continuous IVF @ 110mL/hr per treatment plan.   Problem: Adult Inpatient Plan of Care  Goal: Plan of Care Review  Outcome: Progressing  Goal: Patient-Specific Goal (Individualized)  Outcome: Progressing  Flowsheets (Taken 04/03/2021 0640)  Patient-Specific Goals (Include Timeframe): Pt would like to have BM 9/23  Individualized Care Needs: cluster care when able at night for optimal rest  Anxieties, Fears or Concerns: panxious regarding any potential side effects related to second cycle of chemo  Goal: Absence of Hospital-Acquired Illness or Injury  Outcome: Progressing  Intervention: Identify and Manage Fall Risk  Recent Flowsheet Documentation  Taken 04/02/2021 2007 by Lynda Rainwater, RN  Safety Interventions: chemotherapeutic agent precautions  Intervention: Prevent and Manage VTE (Venous Thromboembolism) Risk  Recent Flowsheet Documentation  Taken 04/02/2021 2007 by Lynda Rainwater, RN  Activity Management: ambulated in room  Goal: Optimal Comfort and Wellbeing  Outcome: Progressing  Goal: Readiness for Transition of Care  Outcome: Progressing  Goal: Rounds/Family Conference  Outcome: Progressing     Problem: Infection  Goal: Absence of Infection Signs and Symptoms  Outcome: Progressing     Problem: Hypertension Comorbidity  Goal: Blood Pressure in Desired Range  Outcome: Progressing

## 2021-04-03 NOTE — Unmapped (Signed)
Oncology (MEDO) Progress Note    Assessment & Plan:   Jeremy Faulkner is a 65 y.o. 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 *      De-differentiated??Liposarcoma: Followed by Dr. Dorna Mai at [facility]. De-differentiated liposarcoma of right kidney, localized disease only. S/p right nephrectomy June 2022. Recent ECHO with LVEF 55-60%.  Tolerated cycle 1 (day 1 = 8/31) without complication. Doxorubicin again dosed as one-day push (vs. 3 day regimen) to reflect consensus in [facility] sarcoma program to mitigate interruptions in chemotherapy due to ifosfamide may delay doxorubicin. Ifosfamide dose reduced due to CKD and sp nephrectomy.   -Anti-emetics: Decadron D1-4; Ondansetron D1-4; Aprepitant D1-4; Zyprexa HS D1-3 ??  -discharge with GCSF for self-administration  -follow-up with Dr. Dorna Mai 04/21/21   -daily UA  - anticipated discharge 9/24 after final [place] dose                                                                               Chemo Plan  Date?? 9/21  ?? ?? ?? ?? ??   Day  1  2  3  4      Doxorubicin??75mg /m2 x  ?? ?? ?? ??   Ifosfamide2.0g/m2 x  x  x?? x ??   Mes[place] 800mg /m2 x x x x ??   ?? ?? ?? ?? ?? ??   ??  ??  HLD: On Rosuvastatin 20mg  every day at home. Held on admission but can restart on discharge.  ??  Acute Kidney Injury: Patient with worsening Serum Creatinine since 6/25 in setting of right sided nephrectomy secondary to liposarcoma. Serum Creatinine 1.66 on admission. Ifosphamide dose reduced. Educated to avoid other nephrotoxic medications at home.   -avoid nephrotoxic medications   ??  Impending Electrolyte Abnormality Secondary to Chemotherapy and/or IV Fluids  -Daily Electrolyte monitoring  -Replete per [facility] guidelines.??  ??  Impending Pancytopenia secondary to chemotherapy:??  - Transfuse 1 unit of pRBCs for hgb >7  - Transfuse 1 unit plt for plts >10K??    Health Maintenance  - Discussed [place] for COVID-19 prevention, provided information sheet to patient  - Pt to review EUA information with outpatient oncologist    Daily Checklist:  Diet: Regular Diet  DVT PPx: Lovenox 40mg  q24h  Electrolytes: Replete Potassium to >/= 3.6 and Magnesium to >/= 1.8  Code Status: Full Code  Dispo: EDD 9/24    Team Contact Information:   Primary Team: Oncology (MEDO)  Primary Resident: Ricarda Frame, MD  Resident's Pager: [phone] (Oncology Intern - Cliffton Asters)    Interval History:   No acute events overnight.    Patient feels well again this morning. He denies any acute concerns. No chest pain, shortness of breath, nausea, vomiting, headache, vision changes, numbness/tingling.    He notes that he had some hiccups yesterday evening that resolved with 1x dose of baclofen.    All other systems were reviewed and are negative except as noted in the HPI    Objective:   Temp:  [36.3 ??  C (97.3 ??F)-36.6 ??C (97.9 ??F)] 36.6 ??C (97.9 ??F)  Heart Rate:  [58-75] 62  Resp:  [18-20] 18  BP: (108-138)/(57-72) 138/68  SpO2:  [96 %-100 %] 100 %    Gen: WDWN  in NAD, answers questions appropriately  Eyes: Sclera anicteric, EOMI, PERRLA,  HENT: atraumatic, normocephalic, MMM. OP w/o erythema or exudate   Neck: no cervical lymphadenopathy or thyromegaly, no JVD  Heart: RRR, S1, S2, no M/R/G, no chest wall tenderness  Lungs: CTAB, no crackles or wheezes, no use of accessory muscles  Abdomen: Normoactive bowel sounds, soft, NTND, no rebound/guarding, no hepatosplenomegaly  Extremities: no clubbing, cyanosis, or edema: pulses are +2 in bilateral upper and lower extremities  Neuro: CN II-XI grossly intact, normal cerebellar function, normal gait. No focal deficits.  Skin:  No rashes, lesions on clothed exam  Psych: Alert, oriented, normal mood and affect.     Labs/Studies: Labs and Studies from the last 24hrs per EMR and Reviewed

## 2021-04-04 DIAGNOSIS — C499 Malignant neoplasm of connective and soft tissue, unspecified: Secondary | ICD-10-CM | POA: Diagnosis not present

## 2021-04-04 DIAGNOSIS — N179 Acute kidney failure, unspecified: Secondary | ICD-10-CM | POA: Diagnosis not present

## 2021-04-04 DIAGNOSIS — E785 Hyperlipidemia, unspecified: Secondary | ICD-10-CM | POA: Diagnosis not present

## 2021-04-04 LAB — URINALYSIS
BACTERIA: NONE SEEN /HPF
BILIRUBIN UA: NEGATIVE
BLOOD UA: NEGATIVE
GLUCOSE UA: NEGATIVE
KETONES UA: 100 — AB
LEUKOCYTE ESTERASE UA: NEGATIVE
NITRITE UA: NEGATIVE
PH UA: 6.5 (ref 5.0–9.0)
PROTEIN UA: NEGATIVE
RBC UA: <1 /HPF (ref <=3)
SPECIFIC GRAVITY UA: 1.019 (ref 1.003–1.030)
SQUAMOUS EPITHELIAL: <1 /HPF (ref 0–5)
UROBILINOGEN UA: <2
WBC UA: <1 /HPF (ref <=2)

## 2021-04-04 LAB — CBC W/ AUTO DIFF
BASOPHILS ABSOLUTE COUNT: 0 10*9/L (ref 0.0–0.1)
BASOPHILS RELATIVE PERCENT: 0.1 %
EOSINOPHILS ABSOLUTE COUNT: 0 10*9/L (ref 0.0–0.5)
EOSINOPHILS RELATIVE PERCENT: 0 %
HEMATOCRIT: 29.7 % — ABNORMAL LOW (ref 39.0–48.0)
HEMOGLOBIN: 10.3 g/dL — ABNORMAL LOW (ref 12.9–16.5)
LYMPHOCYTES ABSOLUTE COUNT: 0.3 10*9/L — ABNORMAL LOW (ref 1.1–3.6)
LYMPHOCYTES RELATIVE PERCENT: 4.4 %
MEAN CORPUSCULAR HEMOGLOBIN CONC: 34.7 g/dL (ref 32.0–36.0)
MEAN CORPUSCULAR HEMOGLOBIN: 29.3 pg (ref 25.9–32.4)
MEAN CORPUSCULAR VOLUME: 84.5 fL (ref 77.6–95.7)
MEAN PLATELET VOLUME: 7.3 fL (ref 6.8–10.7)
MONOCYTES ABSOLUTE COUNT: 0.6 10*9/L (ref 0.3–0.8)
MONOCYTES RELATIVE PERCENT: 7.9 %
NEUTROPHILS ABSOLUTE COUNT: 6.5 10*9/L (ref 1.8–7.8)
NEUTROPHILS RELATIVE PERCENT: 87.6 %
PLATELET COUNT: 222 10*9/L (ref 150–450)
RED BLOOD CELL COUNT: 3.51 10*12/L — ABNORMAL LOW (ref 4.26–5.60)
RED CELL DISTRIBUTION WIDTH: 15.4 % — ABNORMAL HIGH (ref 12.2–15.2)
WBC ADJUSTED: 7.5 10*9/L (ref 3.6–11.2)

## 2021-04-04 LAB — MAGNESIUM: MAGNESIUM: 2 mg/dL (ref 1.6–2.6)

## 2021-04-04 LAB — BASIC METABOLIC PANEL
ANION GAP: 6 mmol/L (ref 5–14)
BLOOD UREA NITROGEN: 20 mg/dL (ref 9–23)
BUN / CREAT RATIO: 14
CALCIUM: 7.9 mg/dL — ABNORMAL LOW (ref 8.7–10.4)
CHLORIDE: 117 mmol/L — ABNORMAL HIGH (ref 98–107)
CO2: 22 mmol/L (ref 20.0–31.0)
CREATININE: 1.48 mg/dL — ABNORMAL HIGH
EGFR CKD-EPI (2021) MALE: 52 mL/min/{1.73_m2} — ABNORMAL LOW (ref >=60)
GLUCOSE RANDOM: 126 mg/dL (ref 70–179)
POTASSIUM: 4.4 mmol/L (ref 3.4–4.8)
SODIUM: 145 mmol/L (ref 135–145)

## 2021-04-04 LAB — PHOSPHORUS: PHOSPHORUS: 2.8 mg/dL (ref 2.4–5.1)

## 2021-04-04 MED ADMIN — sodium chloride (NS) 0.9 % flush 10 mL: 10 mL | INTRAVENOUS | @ 15:00:00 | Stop: 2021-04-04

## 2021-04-04 MED ADMIN — docusate sodium (COLACE) capsule 100 mg: 100 mg | ORAL | @ 03:00:00

## 2021-04-04 MED ADMIN — ifosfamide (IFEX) 4.26 g in sodium chloride (NS) 0.9 % 500 mL IVPB: 2 g/m2 | INTRAVENOUS | @ 03:00:00 | Stop: 2021-04-04

## 2021-04-04 MED ADMIN — mesna (MESNEX) 1,421 mg in sodium chloride (NS) 0.9 % 50 mL IVPB: 667 mg/m2 | INTRAVENOUS | @ 14:00:00 | Stop: 2021-04-04

## 2021-04-04 MED ADMIN — enoxaparin (LOVENOX) syringe 40 mg: 40 mg | SUBCUTANEOUS | @ 01:00:00

## 2021-04-04 MED ADMIN — OLANZapine (ZyPREXA) tablet 5 mg: 5 mg | ORAL | @ 01:00:00 | Stop: 2021-04-03

## 2021-04-04 MED ADMIN — mesna (MESNEX) 1,421 mg in sodium chloride (NS) 0.9 % 50 mL IVPB: 667 mg/m2 | INTRAVENOUS | @ 09:00:00 | Stop: 2021-04-04

## 2021-04-04 MED ADMIN — sodium chloride (NS) 0.9 % flush 10 mL: 10 mL | INTRAVENOUS | @ 01:00:00

## 2021-04-04 MED ADMIN — mesna (MESNEX) 1,421 mg in sodium chloride (NS) 0.9 % 50 mL IVPB: 667 mg/m2 | INTRAVENOUS | @ 03:00:00 | Stop: 2021-04-03

## 2021-04-04 MED ADMIN — ondansetron (ZOFRAN) tablet 24 mg: 24 mg | ORAL | @ 02:00:00 | Stop: 2021-04-03

## 2021-04-04 MED ADMIN — docusate sodium (COLACE) capsule 100 mg: 100 mg | ORAL | @ 14:00:00 | Stop: 2021-04-04

## 2021-04-04 MED ADMIN — aprepitant (EMEND) capsule 80 mg: 80 mg | ORAL | @ 02:00:00 | Stop: 2021-04-03

## 2021-04-04 MED ADMIN — dexAMETHasone (DECADRON) tablet 12 mg: 12 mg | ORAL | @ 02:00:00 | Stop: 2021-04-03

## 2021-04-04 MED ADMIN — sodium chloride (NS) 0.9 % infusion: 125 mL/h | INTRAVENOUS | @ 09:00:00 | Stop: 2021-04-04

## 2021-04-04 NOTE — Unmapped (Signed)
Physician Discharge Summary [facility]  4 ONC [facility]  [address]  [place] [address]  Dept: [phone]  Loc: [phone]     Identifying Information:   Izac Faulkenberry  [birth year]  [id number]    Primary Care Physician: Glori Luis, MD     Referring Physician: Norm Parcel     Code Status: Full Code    Admit Date: 04/01/2021    Discharge Date: 04/04/2021     Discharge To: Home    Discharge Service: [facility] - Oncology Floor Team (MEDO)     Discharge Attending Physician: No att. providers found    Discharge Diagnoses:  Active Problems:    Liposarcoma (CMS-HCC)  Resolved Problems:    * No resolved hospital problems. *      Outpatient Provider Follow Up Issues:   Follow-up Plan after discharge:  1. Issues related to hospitalization: Follow up with your oncologist about treatment plan  2. Follow-up appointment for lab draws: 04/21/21  3. Follow-up appointment with [facility] Oncology: 04/21/21  4. Oncology specific plans going forward: Will defer to primary oncologist    Patient's primary oncologist and/or nurse navigator: Dr. Sindy Messing Course:   [place] is a 65 y.o. 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     De-differentiated Liposarcoma: Followed by Dr. Dorna Mai at [facility]. Admitted for planned cycle 2 adjuvant chemotherapy with AIM. Patient tolerated treatment well, he did not experience any nausea, vomiting, or neurological concerns. Daily urinalysis did not show evidence of hemorrhagic cystitis. Discussed importance of GCSF administration after discharge. Monitored for electrolyte abnormalities on daily labs and repleted per [facility] guidelines. Monitored for cytopenias with transfusion goals Hgb>7, Plt>10. Patient did not need any transfusions while admitted. Chemo Plan  Date  9/21              Day  1  2  3  4       Doxorubicin 75mg /m2 x            Ifosfamide2.0g/m2 x  x  x  x     Mesna 800mg /m2 x x x x                         HLD: On Rosuvastatin 20mg  every day at home. Held on admission but restarted on discharge.     Acute Kidney Injury: Patient with worsening Serum Creatinine since 6/25 in setting of right sided nephrectomy secondary to liposarcoma. Serum Creatinine 1.66 on admission. Ifosphamide dose reduced. Educated to avoid other nephrotoxic medications at home.      Health Maintenance  - Discussed [employer] for COVID-19 prevention, provided information sheet to patient  - Pt to review EUA information with outpatient oncologist        Procedures:  [facility] INFUSION APPOINTMENT REQUEST [[id number]]  ______________________________________________________________________  Discharge Medications:     Your Medication List      CONTINUE taking these medications    docusate sodium 100 MG capsule  Commonly known as: COLACE  Take 1 capsule (100 mg total) by mouth daily.     empty container Misc  Commonly known as: sharps container  Use as directed     OLANZapine 5 MG tablet  Commonly known as: ZyPREXA  Take 1 tablet (5 mg total) by mouth nightly. Take nightly for 3 days following chemotherapy to prevent nausea.     ondansetron 8 MG tablet  Commonly known as: ZOFRAN  Take 1 tablet (8 mg total) by mouth every eight (8) hours as needed for nausea (or vomiting).     prochlorperazine 10 MG tablet  Commonly known as: COMPAZINE  Take 1 tablet (10 mg total) by mouth every six (6) hours as needed (nausea or vomitting).     rosuvastatin 20 MG tablet  Commonly known as: CRESTOR  Take 1 tablet by mouth daily.     ZIEXTENZO 6 mg/0.6 mL injection  Generic drug: pegfilgrastim-bmez  Inject 1 syringe (0.64mL) under the skin once every 21 days. Inject 24-72h after last chemotherapy dose on day 5.            Allergies:  Patient has no known allergies.  ______________________________________________________________________  Pending Test Results (if blank, then none):      Most Recent Labs:  All lab results last 24 hours -   Recent Results (from the past 24 hour(s))   Type and Screen subsequent    Collection Time: 04/04/21  1:27 AM   Result Value Ref Range    Reject/Recollect REJECT    Basic Metabolic Panel    Collection Time: 04/04/21  1:27 AM   Result Value Ref Range    Sodium 145 135 - 145 mmol/L    Potassium 4.4 3.4 - 4.8 mmol/L    Chloride 117 (H) 98 - 107 mmol/L    CO2 22.0 20.0 - 31.0 mmol/L    Anion Gap 6 5 - 14 mmol/L    BUN 20 9 - 23 mg/dL    Creatinine 1.61 (H) 0.60 - 1.10 mg/dL    BUN/Creatinine Ratio 14     eGFR CKD-EPI (2021) Male 52 (L) >=60 mL/min/1.15m2    Glucose 126 70 - 179 mg/dL    Calcium 7.9 (L) 8.7 - 10.4 mg/dL   Magnesium Level    Collection Time: 04/04/21  1:27 AM   Result Value Ref Range    Magnesium 2.0 1.6 - 2.6 mg/dL   Phosphorus Level    Collection Time: 04/04/21  1:27 AM   Result Value Ref Range    Phosphorus 2.8 2.4 - 5.1 mg/dL   CBC w/ Differential    Collection Time: 04/04/21  1:27 AM   Result Value Ref Range    WBC 7.5 3.6 - 11.2 10*9/L    RBC 3.51 (L) 4.26 - 5.60 10*12/L    HGB 10.3 (L) 12.9 - 16.5 g/dL    HCT 09.6 (L) 04.5 - 48.0 %    MCV 84.5 77.6 - 95.7 fL    MCH 29.3 25.9 - 32.4 pg    MCHC 34.7 32.0 - 36.0 g/dL    RDW 40.9 (H) 81.1 - 15.2 %    MPV 7.3 6.8 - 10.7 fL    Platelet 222 150 - 450 10*9/L    Neutrophils % 87.6 %    Lymphocytes % 4.4 %    Monocytes % 7.9 %    Eosinophils % 0.0 %    Basophils % 0.1 %    Absolute Neutrophils 6.5 1.8 - 7.8 10*9/L    Absolute Lymphocytes 0.3 (L) 1.1 - 3.6 10*9/L    Absolute Monocytes 0.6 0.3 - 0.8 10*9/L    Absolute Eosinophils 0.0 0.0 - 0.5 10*9/L    Absolute Basophils 0.0 0.0 - 0.1 10*9/L   Urinalysis    Collection Time: 04/04/21  7:59 AM   Result Value Ref Range    Color, UA Light Yellow     Clarity, UA Clear  Specific Gravity, UA 1.019 1.003 - 1.030    pH, UA 6.5 5.0 - 9.0    Leukocyte Esterase, UA Negative Negative    Nitrite, UA Negative Negative    Protein, UA Negative Negative    Glucose, UA Negative Negative    Ketones, UA 100 mg/dL (A) Negative    Urobilinogen, UA <2.0 mg/dL <1.6 mg/dL    Bilirubin, UA Negative Negative    Blood, UA Negative Negative    RBC, UA <1 <=3 /HPF    WBC, UA <1 <=2 /HPF    Squam Epithel, UA <1 0 - 5 /HPF    Bacteria, UA None Seen None Seen /HPF    Mucus, UA Rare (A) None Seen /HPF       Relevant Studies/Radiology (if blank, then none):  No results found.  ______________________________________________________________________  Discharge Instructions:             Other Instructions     Discharge instructions      It was a pleasure taking care of you!    Diagnosis: Liposarcoma, scheduled chemotherapy infusion admission    You were admitted for scheduled chemotherapy as directed by your oncologist, Dr. Dorna Mai.    Chemotherapy during admission? yes  - Type of chemotherapy: Cycle 2 AIM (doxorubicin, ifosfamide, mesna)  - Post chemotherapy nausea regimen: olanzapine nightly for 3 night to prevent nausea; ondansetron & prochlorperazine as needed for nausea  - Pegfilgrastim (Ziextenzo) injection on Sunday 04/05/21 any time - administer this medication at home (you have already received this from [facility])    New/Important Medications:  - Please continue to take your medications as prescribed by your oncologist.   - Ensure Pegfilgrastim (Ziextenzo) injection is administered on Sunday, 04/05/21 at home    Follow up Appointments:   - 04/21/21: CT scan at 9:20am  - 04/21/21: Lab appointment at 11am  - 04/21/21: Appointment with Dr. Dorna Mai at 12pm    --------------    When to Call Your [place] Cancer Care Team:   Monday- Friday from 8:00 am - 5:00 pm: Call [phone] or Toll free [phone].  Ask to speak to the Triage Nurse  On Nights, Weekends and Holidays: Call [phone]. Ask the operator to page the Oncology Fellow on Call     RED ZONE:  Take action now!  You need to be seen right away. Call 911 or go to your nearest hospital for help.   - Symptoms are at a severe level of discomfort    - Bleeding that will not stop  - Chest Pain    - Hard to breathe    - Fall or passing out    - New Seizure    - Thoughts of hurting yourself or others     YELLOW ZONE: Take action today  This is NOT an all-inclusive list. Pleae call with any new or worsening symptoms.   Call your doctor, nurse or other healthcare provider at [phone]  You can be seen by a provider the same day through our Same Day Acute Care for Patients with Cancer program.   - Symptoms are new or worsening; You are not within your goal range for:    - Pain          - Swelling (leg, arm, abdomen, face, neck)    - Shortness of breath        - Skin rash or skin changes    - Bleeding (nose, urine, stool, wound)    - Wound issues (  redness, drainage, re-opened)    - Feeling sick to your stomach and throwing up    - Confusion    - Mouth sores/pain in your mouth or throat     - Vision changes   - Hard stool or very loose stools (increase in ostomy   - Fever >100.4 F, chills     Output)        - Worsening cough with mucus that is green, yellow or bloody   - No urine for 12 hours      - Pain or burning when going to the bathroom    - Feeding tube or other catheter/tube issue     - Home infusion pump issue - call [phone]   - Redness or pain at previous IV or port/catheter site    - Depressed or anxiety     GREEN ZONE: You are in control   Your symptoms are under control. Continue to take your medicine as ordered. Keep all visits to the doctor.   - No increase or worsening symptoms   - Able to take your medicine   - Able to drink and eat     For your safety and best care, please DO NOT use MyChart messages to report red or yellow symptoms.   MyChart messages are only checked during weekday normal business hours and you should receive a   Response within 2 business days.   Please use MyChart only for the follows:   - Non-urgent medication refills, scheduling requests or general questions.           Patient Education:     - Wash your hands routinely with soap and water  - Take your temperature when you have chills or are not feeling well  - Use a soft toothbrush  - Avoid constipation or straining with bowel movements. This may mean you occasionally need to take over-the-counter stool softeners or laxatives.   - Avoid people who have colds or the flu, or are not feeling well.  - Wear a mask when visiting crowded places.  - Maintain a well-balanced diet and eat healthy foods  - Speak with your doctor before having any dental work done  - Do only as much activity as you can tolerate    Other instructions:  - Don't use dental floss if your platelet count is below 50,000. Your doctor or nurse should tell you if this is the case.  - Use any mouthwashes given to you as directed.  - If you can't tolerate regular brushing, use an oral swab (bristle-less) toothbrush, or use salt and baking soda to clean your mouth. Mix 1 teaspoon of salt and 1 teaspoon of baking soda into an 8-ounce glass of warm water. Swish and spit.  - Watch your mouth and tongue for white patches. This is a sign of fungal infection, a common side effect of chemotherapy. Be sure to tell your doctor about these patches. Medication/mouthwashes can be prescribed to help you fight the fungal infection.      COVID-19 is a new challenge, but [facility] and the [facility] is dedicated to providing you and your loved ones with the best possible cancer care and support in the safest way possible during this time. We made two videos about the ways we are working to keep you safe, such as offering the option to visit your care team over the phone or through a video, as well as support services offered for our patients and  their caregivers. If you have any questions about your cancer care, please call your care team.  ??  Video #1: Keeping [facility] Cancer Care patients safe during the COVID-19 crisis  http://go.eabjmlille.com  ??  Video #2: Support for cancer patients and their caregivers during the COVID-19 pandemic  http://go.SecureGap.uy  ??  Video #2: Support for cancer patients and their caregivers during the COVID-19 pandemic  http://go.SecureGap.uy          Follow Up instructions and Outpatient Referrals     Discharge instructions          Appointments which have been scheduled for you    Apr 21, 2021  9:20 AM  (Arrive by 9:05 AM)  CT ABDOMEN PELVIS W CONTRAST with IC CT RM 1  RAD [facility] [address] [facility]) [address]  [place] [address]  [phone]   On appt date:  Drink lots of water 24 hrs  Bring recent lab work  Take meds as usual  Civil Service fast streamer of current meds  Bring snack if diabetic    On appt date do not:  Consume anything 2 hrs prior to your appointment    Let us know if pt:  Allergic to contrast dyes  Diabetic  Pregnant or nursing  Claustrophobic    (Title:[id number])     Apr 21, 2021 11:00 AM  (Arrive by 10:30 AM)  NURSE LAB DRAW with ADULT ONC LAB  [facility] ADULT ONCOLOGY LAB DRAW STATION [facility] REGION) [address]  [place] [address]  [phone]      Apr 21, 2021 12:00 PM  (Arrive by 11:30 AM)  RETURN ACTIVE [place] with Norm Parcel, MD  [facility] ONCOLOGY MULTIDISCIPLINARY 2ND FLR CANCER HOSP [facility] REGION) [address]  [place] [address]  [phone]      Apr 22, 2021 10:00 AM  (Arrive by 9:30 AM)  SCHED ADM THROUGH INF with ONCINF CHAIR 34  [place] ONCOLOGY INFUSION [place] [facility] REGION) [address]  [place] [address]  [phone]           ______________________________________________________________________  Discharge Day Services:  BP 150/71  - Pulse 65  - Temp 36.3 ??C (97.3 ??F) (Oral)  - Resp 17  - Ht 188 cm (6' 2.02)  - Wt 93.3 kg (205 lb 11.2 oz)  - SpO2 100%  - BMI 26.40 kg/m??   Pt seen on the day of discharge and determined appropriate for discharge.    Condition at Discharge: good    Length of Discharge: I spent greater than 30 mins in the discharge of this patient.

## 2021-04-04 NOTE — Unmapped (Signed)
Pt discharged home, accompanied by spouse.  VSS. Denies acute distress or pain. Mode of transportation - ambulating. Declined wheelchair. Pt and family verbalized understanding of AVS.      Problem: Adult Inpatient Plan of Care  Goal: Plan of Care Review  Outcome: Resolved  Goal: Patient-Specific Goal (Individualized)  Outcome: Resolved  Goal: Absence of Hospital-Acquired Illness or Injury  Outcome: Resolved  Goal: Optimal Comfort and Wellbeing  Outcome: Resolved  Goal: Readiness for Transition of Care  Outcome: Resolved  Goal: Rounds/Family Conference  Outcome: Resolved     Problem: Infection  Goal: Absence of Infection Signs and Symptoms  Outcome: Resolved     Problem: Hypertension Comorbidity  Goal: Blood Pressure in Desired Range  Outcome: Resolved

## 2021-04-04 NOTE — Unmapped (Signed)
Day 3 of AIM chemo. Tolerating well. VSS. Pt reports no issues except Pt c/o constipation. Colace given per order. Pt has no c/o progressive hiccups this shift. Voiding well. Receiving continuous IVF @ 162mL/hr per treatment plan. Pt slated for discharge today.    Problem: Adult Inpatient Plan of Care  Goal: Plan of Care Review  Outcome: Progressing  Goal: Patient-Specific Goal (Individualized)  Outcome: Progressing  Goal: Absence of Hospital-Acquired Illness or Injury  Outcome: Progressing  Intervention: Identify and Manage Fall Risk  Recent Flowsheet Documentation  Taken 04/03/2021 1905 by Donnal Debar, RN  Safety Interventions:   environmental modification   fall reduction program maintained   lighting adjusted for tasks/safety   low bed   neutropenic precautions   nonskid shoes/slippers when out of bed  Intervention: Prevent and Manage VTE (Venous Thromboembolism) Risk  Recent Flowsheet Documentation  Taken 04/03/2021 2103 by Donnal Debar, RN  VTE Prevention/Management:   ambulation promoted   anticoagulant therapy  Intervention: Prevent Infection  Recent Flowsheet Documentation  Taken 04/03/2021 1905 by Donnal Debar, RN  Infection Prevention:   cohorting utilized   environmental surveillance performed   equipment surfaces disinfected   hand hygiene promoted   personal protective equipment utilized   rest/sleep promoted   single patient room provided   visitors restricted/screened  Goal: Optimal Comfort and Wellbeing  Outcome: Progressing  Goal: Readiness for Transition of Care  Outcome: Progressing  Goal: Rounds/Family Conference  Outcome: Progressing     Problem: Infection  Goal: Absence of Infection Signs and Symptoms  Outcome: Progressing  Intervention: Prevent or Manage Infection  Recent Flowsheet Documentation  Taken 04/03/2021 1905 by Donnal Debar, RN  Infection Management: aseptic technique maintained     Problem: Hypertension Comorbidity  Goal: Blood Pressure in Desired Range  Outcome: Progressing

## 2021-04-13 NOTE — Unmapped (Signed)
Hello,    Please scheduled patient Jeremy Faulkner on 06/22/21 for a SCHED ADM THROUGH INF at  10:00 AM     ?? Reason for Admission: Scheduled chemotherapy   ?? Primary Diagnosis:  Encounter for antineoplastic chemotherapy for sarcoma  ?? Primary Diagnosis ICD-10 Code:  Z51.11 & c49.9  ?? Expected length of stay: 5 Days   ?? CPT Code:  16109   ?? CPT Code Description: Under Injection and Intravenous Infusion Chemotherapy and Other Highly Complex Drug or Highly Complex Biologic Agent Administration   ?? Treating Attending: Dorna Mai  ?? Need for PICC placement in Infusion? No    Infusion Scheduling: Please notify the patient of the appointment date and time once scheduled. Please document this information within this encounter and then route to the navigator prior to closing the encounter.       Scheduled Admissions Assessment        Assessment  Response  Intervention    Type of insurance  Medicare Financial counselor referral is not warranted this time.     Reliable Trasportation  Yes Referral for Social Work is not warranted this time.   Central Line Access  Yes Patient has line access and does not require an intervention at this time     Other Pertinent Information:  N/A    Provider:  1. Please place order for PICC  no   Order name: Insert PICC line by Vascular Access (must have the house for  outpatient designation)  2.Please place order for [facility] plan for infusion center     Nurse:   Route this smart phrase to Provider (which signals need to place and sign chemotherapy orders, and to place order for PICC line (if applicable) and line care plan in the infusion center on day of admission)    Thank you,  Lajuana Matte

## 2021-04-14 DIAGNOSIS — K5900 Constipation, unspecified: Secondary | ICD-10-CM | POA: Diagnosis not present

## 2021-04-14 DIAGNOSIS — R110 Nausea: Secondary | ICD-10-CM | POA: Diagnosis not present

## 2021-04-14 DIAGNOSIS — Z8249 Family history of ischemic heart disease and other diseases of the circulatory system: Secondary | ICD-10-CM | POA: Diagnosis not present

## 2021-04-14 DIAGNOSIS — Z803 Family history of malignant neoplasm of breast: Secondary | ICD-10-CM | POA: Diagnosis not present

## 2021-04-14 DIAGNOSIS — E785 Hyperlipidemia, unspecified: Secondary | ICD-10-CM | POA: Diagnosis not present

## 2021-04-14 DIAGNOSIS — Z85828 Personal history of other malignant neoplasm of skin: Secondary | ICD-10-CM | POA: Diagnosis not present

## 2021-04-14 DIAGNOSIS — C649 Malignant neoplasm of unspecified kidney, except renal pelvis: Secondary | ICD-10-CM | POA: Diagnosis not present

## 2021-04-14 DIAGNOSIS — T451X5D Adverse effect of antineoplastic and immunosuppressive drugs, subsequent encounter: Secondary | ICD-10-CM | POA: Diagnosis not present

## 2021-04-14 DIAGNOSIS — E663 Overweight: Secondary | ICD-10-CM | POA: Diagnosis not present

## 2021-04-14 DIAGNOSIS — R030 Elevated blood-pressure reading, without diagnosis of hypertension: Secondary | ICD-10-CM | POA: Diagnosis not present

## 2021-04-15 ENCOUNTER — Telehealth: Payer: Self-pay | Admitting: Family Medicine

## 2021-04-15 NOTE — Telephone Encounter (Signed)
Patients wife is calling in to see if he had his pneumonia shot at his last appointment with Dr.Sonnenberg.Please advise.

## 2021-04-15 NOTE — Telephone Encounter (Signed)
I called the patient's wife and informed her that the patient had the Prevnar 13 in 20221 and he could get the new Prevnar 20 in December at his visit.  She understood.  Enriqueta Augusta,cma

## 2021-04-17 DIAGNOSIS — C499 Malignant neoplasm of connective and soft tissue, unspecified: Principal | ICD-10-CM

## 2021-04-17 NOTE — Unmapped (Signed)
[facility] Clinical Assessment & Refill Coordination Note    Jeremy Faulkner, DOB: [birth year]  Phone: [phone] (home)     All above HIPAA information was verified with patient and wife     Was a translator used for this call? No    Specialty Medication(s):   Hematology/Oncology: Ziextenzo     Current Outpatient Medications   Medication Sig Dispense Refill   ??? docusate sodium (COLACE) 100 MG capsule Take 1 capsule (100 mg total) by mouth daily. 30 capsule 0   ??? empty container (SHARPS CONTAINER) Misc Use as directed 1 each 2   ??? OLANZapine (ZYPREXA) 5 MG tablet Take 1 tablet (5 mg total) by mouth nightly. Take nightly for 3 days following chemotherapy to prevent nausea. 18 tablet 0   ??? ondansetron (ZOFRAN) 8 MG tablet Take 1 tablet (8 mg total) by mouth every eight (8) hours as needed for nausea (or vomiting). 30 tablet 3   ??? pegfilgrastim-bmez (ZIEXTENZO) 6 mg/0.6 mL injection Inject 1 syringe (0.54mL) under the skin once every 21 days. Inject 24-72h after last chemotherapy dose on day 5. 0.6 mL 3   ??? prochlorperazine (COMPAZINE) 10 MG tablet Take 1 tablet (10 mg total) by mouth every six (6) hours as needed (nausea or vomitting). 30 tablet 3   ??? rosuvastatin (CRESTOR) 20 MG tablet Take 1 tablet by mouth daily.       No current facility-administered medications for this visit.        Changes to medications: Patrice reports no changes at this time.    No Known Allergies    Changes to allergies: No    SPECIALTY MEDICATION ADHERENCE     Ziextenzo 6 mg: 0 days of medicine on hand     Medication Adherence    Patient reported X missed doses in the last month: 0  Specialty Medication: Ziextenzo injection - due 24-72 hours after last chemo on D5.  Dose due D6-8  Patient is on additional specialty medications: No  Informant: spouse  Confirmed plan for next specialty medication refill: delivery by pharmacy  Refills needed for supportive medications: not needed          Specialty medication(s) dose(s) confirmed: Regimen is correct and unchanged.     Are there any concerns with adherence? No    Adherence counseling provided? Not needed    CLINICAL MANAGEMENT AND INTERVENTION      Clinical Benefit Assessment:    Do you feel the medicine is effective or helping your condition? Yes    Clinical Benefit counseling provided? Not needed    Adverse Effects Assessment:    Are you experiencing any side effects? No    Are you experiencing difficulty administering your medicine? No    Quality of Life Assessment:    Quality of Life    Rheumatology  Oncology  Dermatology  Cystic Fibrosis          How many days over the past month did your Liposarcoma  keep you from your normal activities? For example, brushing your teeth or getting up in the morning. 0    Have you discussed this with your provider? Not needed    Acute Infection Status:    Acute infections noted within Epic:  No active infections  Patient reported infection: None    Therapy Appropriateness:    Is therapy appropriate and patient progressing towards therapeutic goals? Yes, therapy is appropriate and should be continued    DISEASE/MEDICATION-SPECIFIC INFORMATION  For patients on injectable medications: Patient currently has 0 doses left.  Next injection is scheduled for 04/27/21.    PATIENT SPECIFIC NEEDS     - Does the patient have any physical, cognitive, or cultural barriers? No    - Is the patient high risk? No    - Does the patient require a Care Management Plan? No     - Does the patient require physician intervention or other additional services (i.e. nutrition, smoking cessation, social work)? No      SHIPPING     Specialty Medication(s) to be Shipped:   Hematology/Oncology: Ziextenzo    Other medication(s) to be shipped: No additional medications requested for fill at this time     Changes to insurance: No    Delivery Scheduled: Yes, Expected medication delivery date: 04/23/21.     Medication will be delivered via UPS to the confirmed prescription address in [facility].    The patient will receive a drug information handout for each medication shipped and additional FDA Medication Guides as required.  Verified that patient has previously received a Conservation officer, historic buildings and a Surveyor, mining.    The patient or caregiver noted above participated in the development of this care plan and knows that they can request review of or adjustments to the care plan at any time.      All of the patient's questions and concerns have been addressed.    Breck Coons [facility] Specialty Pharmacist

## 2021-04-21 ENCOUNTER — Ambulatory Visit: Admit: 2021-04-21 | Discharge: 2021-04-22 | Payer: MEDICARE

## 2021-04-21 ENCOUNTER — Ambulatory Visit
Admit: 2021-04-21 | Discharge: 2021-04-22 | Payer: MEDICARE | Attending: Student in an Organized Health Care Education/Training Program | Primary: Student in an Organized Health Care Education/Training Program

## 2021-04-21 ENCOUNTER — Other Ambulatory Visit: Admit: 2021-04-21 | Discharge: 2021-04-22 | Payer: MEDICARE

## 2021-04-21 DIAGNOSIS — C499 Malignant neoplasm of connective and soft tissue, unspecified: Principal | ICD-10-CM

## 2021-04-21 DIAGNOSIS — R918 Other nonspecific abnormal finding of lung field: Secondary | ICD-10-CM | POA: Diagnosis not present

## 2021-04-21 DIAGNOSIS — C480 Malignant neoplasm of retroperitoneum: Secondary | ICD-10-CM | POA: Diagnosis not present

## 2021-04-21 LAB — COMPREHENSIVE METABOLIC PANEL
ALBUMIN: 3.9 g/dL (ref 3.4–5.0)
ALKALINE PHOSPHATASE: 50 U/L (ref 46–116)
ALT (SGPT): 32 U/L (ref 10–49)
ANION GAP: 6 mmol/L (ref 5–14)
AST (SGOT): 25 U/L (ref <=34)
BILIRUBIN TOTAL: 0.5 mg/dL (ref 0.3–1.2)
BLOOD UREA NITROGEN: 20 mg/dL (ref 9–23)
BUN / CREAT RATIO: 12
CALCIUM: 8.9 mg/dL (ref 8.7–10.4)
CHLORIDE: 107 mmol/L (ref 98–107)
CO2: 25 mmol/L (ref 20.0–31.0)
CREATININE: 1.62 mg/dL — ABNORMAL HIGH
EGFR CKD-EPI (2021) MALE: 47 mL/min/{1.73_m2} — ABNORMAL LOW (ref >=60)
GLUCOSE RANDOM: 97 mg/dL (ref 70–179)
POTASSIUM: 4.6 mmol/L (ref 3.4–4.8)
PROTEIN TOTAL: 6.1 g/dL (ref 5.7–8.2)
SODIUM: 138 mmol/L (ref 135–145)

## 2021-04-21 LAB — CBC W/ AUTO DIFF
BASOPHILS ABSOLUTE COUNT: 0.1 10*9/L (ref 0.0–0.1)
BASOPHILS RELATIVE PERCENT: 0.7 %
EOSINOPHILS ABSOLUTE COUNT: 0.1 10*9/L (ref 0.0–0.5)
EOSINOPHILS RELATIVE PERCENT: 0.8 %
HEMATOCRIT: 31.6 % — ABNORMAL LOW (ref 39.0–48.0)
HEMOGLOBIN: 11.1 g/dL — ABNORMAL LOW (ref 12.9–16.5)
LYMPHOCYTES ABSOLUTE COUNT: 1 10*9/L — ABNORMAL LOW (ref 1.1–3.6)
LYMPHOCYTES RELATIVE PERCENT: 13.7 %
MEAN CORPUSCULAR HEMOGLOBIN CONC: 35.1 g/dL (ref 32.0–36.0)
MEAN CORPUSCULAR HEMOGLOBIN: 29.6 pg (ref 25.9–32.4)
MEAN CORPUSCULAR VOLUME: 84.3 fL (ref 77.6–95.7)
MEAN PLATELET VOLUME: 6.5 fL — ABNORMAL LOW (ref 6.8–10.7)
MONOCYTES ABSOLUTE COUNT: 0.7 10*9/L (ref 0.3–0.8)
MONOCYTES RELATIVE PERCENT: 9.3 %
NEUTROPHILS ABSOLUTE COUNT: 5.5 10*9/L (ref 1.8–7.8)
NEUTROPHILS RELATIVE PERCENT: 75.5 %
PLATELET COUNT: 238 10*9/L (ref 150–450)
RED BLOOD CELL COUNT: 3.75 10*12/L — ABNORMAL LOW (ref 4.26–5.60)
RED CELL DISTRIBUTION WIDTH: 16 % — ABNORMAL HIGH (ref 12.2–15.2)
WBC ADJUSTED: 7.3 10*9/L (ref 3.6–11.2)

## 2021-04-21 LAB — SLIDE REVIEW

## 2021-04-21 MED ADMIN — iohexoL (OMNIPAQUE) 350 mg iodine/mL solution 100 mL: 100 mL | INTRAVENOUS | @ 14:00:00 | Stop: 2021-04-21

## 2021-04-21 NOTE — Unmapped (Signed)
Hi,     [name] has contacted the [employer] requesting that the following paperwork be completed:     Type CT Scan completed today  Contact Name [facility]- Dr. Vanna Scotland  Contact Number [phone]  Contact Fax [phone]  Date needed back by 04/28/21      Caller has been informed that the expected turnaround is 7-10 business days.    Check Indicates criteria has been reviewed and confirmed with the patient:    [x]  Preferred Name   [x]  DOB and/or MR#  [x]  Preferred Contact Method  [x]  Phone Number(s)   []  Preferred Pharmacy (for medication refills)  []  MyChart       Thank you,   Rosary Lively  [facility]   [phone]

## 2021-04-21 NOTE — Unmapped (Signed)
PLAN FROM TODAY:    We discussed that your scans show no signs of recurrent cancer  You will be admitted tomorrow for your third treatment  We discussed waiting on your Evusheld antibody therapy for a few weeks given you had your booster around October 1st  We will let you know regarding the copay for your GCSF injection (blood boosting shot) and whether this will be given at home, or in the infusion center after you discharge from hospital  Come back and see [place] in clinic in 3 weeks with labs       Support for cancer patients and their caregivers during the COVID-19 pandemic [email or web address]-[email or web address]  Keeping cancer patients safe during the COVID-19 crisis [email or web address]://[email or web address]/news/[email or web address]-care-[email or web address]-during-the-covid-19-crisis/  +++++++++++++++++++++++++++++++++++++++++++++++++++++++++++  Your nurse navigator is:  Roseanne Reno, RN, BSN, OCN   Head & Neck/Sarcoma  Oncology Nurse Navigator  [email or web address]    For appointments & questions Monday through Friday 8 AM-- 5 PM   please call [phone] or Toll free [phone].    On Nights, Weekends and Holidays  Call [phone] and ask for the adult hematologist-oncologist on call.    [facility]  [address]  [place], [place] [address]  [email or web address]  Tel: [phone] / Toll Free [phone]  Fax: [phone]  +++++++++++++++++++++++++++++++++++++++++++++++++++++++++++

## 2021-04-21 NOTE — Unmapped (Signed)
[facility] SARCOMA ONCOLOGY RETURN VISIT    Encounter Date: 04/21/2021  Patient Name: Jeremy Faulkner  Medical Record Number: [id number]    Referring Physician: Norm Parcel, MD  [id number]  Physicians Office Building, 3rd Floor  [place],  [place] [address]    Primary Care Provider: ERIC Gelene Mink, MD    DIAGNOSIS:  1. Dedifferentiated liposarcoma (CMS-HCC)          ASSESSMENT: 65 y.o. male with liposarcoma    RECOMMENDATIONS:    # De-differentiated liposarcoma of the right kidney, localized disease only:    We previously discussed rationale for adjuvant chemotherapy given his clearly high risk for recurrence (large tumor, high grade, Sar[place] 7-year overall survival 41%, 7-year DFS 19%).  Planed for combination doxorubicin + ifosfamide (AIM),     Today discussed imaging which shows no signs of recurrent or metastatic disease.  The radiologist noted several subcentimeter pulmonary nodules with no prior comparison films available, we obtained 12/24/2020 CT chest and I personally identified similar-sized nodules on prior scan.  We had a long discussion about benefits of 3 vs. 4 cycles of adjuvant AIM, including the clear lack of data in this space.  He is in agreement to proceed with C3 tomorrow and we will discuss again prior to C4.  Continue C3 AIM with D1 bolus doxorubicin (vs. 3 day regimen) to reflect consensus in [facility] sarcoma program (interruptions in chemotherapy due to ifosfamide may delay doxorubicin administration, and doxorubicin has most data for anti-tumor effect in this combination regimen).    - Admission for C3 AIM tomorrow  - RTC 3 weeks for toxicity check, labs, scans, and consideration of C4 AIM    I personally reviewed the medical records, pathology and laboratory results and viewed the imaging. All questions were answered to the patient's satisfaction and they voiced understanding and agreement with the plan. Pt has my contact information and is encouraged to reach out with any further questions.      REASON FOR CONSULTATION:  Jeremy Faulkner is a 65 y.o. male who is seen in consultation at the request of Norm Parcel, MD for evaluation of his soft tissue sarcoma.      HISTORY OF PRESENT ILLNESS:      65 yo male with history of recently resected right kidney liposarcoma presents for evaluation after surgical resection.  Here today with his wife.  Doing well, no new issues.  Tolerated C1 AIM well without nausea, or vomiting.  Did note a few days of reduced appetite and fatigue.  Denies bleeding, easy bruising, fevers, chills, abdominal pain.    Oncologic Timeline:  ?? Presents to PCP with nausea, vomiting and right-sided abdomen and flank pain, which had been intermittent in nature over the past 12 months  ?? 12/12/20: CT A/P with 10.7 x 9.9cm right posterior midpole renal lesion with necrotic center  ?? 12/24/20: CT Chest without contrast: No masses or lesions  ?? 01/02/21: Surgery, hand-assisted laparoscopic right nephrectomy, [facility] pathology review diagnosed as dedifferentiated liposarcoma with rhabdomyosarcomatous differentiation involving kidney (13.5cm by report), 20-30% necrosis, mitoses are 17 per 10 HPF, By report, at [facility] the tumor stained positive for desmin, myogenin, MDM2 and MD2 RNA ISH, and negative for PAX8, CK903, AE1/3, CAM5.2 and GATA3.  ?? 01/15/21: Presents to urology with dyspnea and recent syncope x 4, spO2 94% on room air  ?? 01/15/21: CT A/P with sequela of right nephrectomy, 7.5 x 4.2 x 2.8cm gas and fluid collection in nephrectomy bed, small volume hemoperitoneum,  moderate pneumoperitoneum, hypodense hepatic lesions are too small to further characterize  ?? 01/15/21: CT Angio Chest: No pulmonary lesions or nodules, no PE  ?? 03/11/21: C1 AIM (doxorubicin 75 with dexrazoxane, ifosfamide 2g/m2 x 3 days)  ?? 04/01/21: C2 AIM (doxorubicin 75 with dexrazoxane, ifosfamide 2g/m2 x 3 days)  ?? 04/21/21: CT C/A/P: No evidence of pulmonary metastases, stable subcentimeter lung nodules compared to 12/24/20 scan  ?? 04/22/21: C3 AIM, anticipated     REVIEW OF SYSTEMS:  A comprehensive review of 12 systems was negative except for pertinent positives noted in HPI.    PMHx:  - HLD      PSHx:  - 2022 Right nephrectomy  - 1980s right calf tendon repair     Family Hx:  - Mother, breast cancer in early 57s, diagnosed  - Father, leukemia at age 69 and nonHodgkins lymphoma  - One sister, healthy  - 2 children, both healthy    Social Hx:  - Lifelong nonsmoker  - Denies heavy EtOH use currently or in the past  - 1 glass of wine < 3 per week  - Retired Social research officer, government      ALLERGIES/MEDICATIONS:  Reviewed in Epic          PHYSICAL EXAM:   Vitals: BP 112/74  - Pulse 74  - Temp 36.8 ??C (98.3 ??F) (Temporal)  - Resp 20  - Ht 188 cm (6' 2)  - Wt 90.3 kg (199 lb)  - SpO2 100%  - BMI 25.55 kg/m??   Gen: comfortable, NAD. ECOG PS 0  Eyes: EOMI, sclera anicteric  ENT:  MMM.   Neck: Supple, trachea midline  Lymph: No cervical, submandibular, or supraclavicular lymphadenopathy   CV: RRR no murmurs   Lungs: CTAB w/o rales, rhochi, or wheezing, good respiratory effort   GI: +BS, Soft, NT, ND, no masses appreciated   Ext: Warm and well perfused. No cyanosis, clubbing, or edema bilaterally   Skin: no rashes or lesions, port in place without surrounding erythema, well healed incision  Neuro: Moving all extremities, speech fluid. CNII-XII grossly intact.   Psych: mood euthymic and affect appropriate   MSK: No focal bony deformaties or tenderness  +++++++++++++++++++++++++++++++++++++++++++++++++++++++++++    PATHOLOGY:   Pathology was personally reviewed as described in the HPI, detailed in EPIC.    LABS:  Reviewed in EPIC      RADIOLOGY:  Imaging was personally viewed as summarized in the history (see above).  I agree with the findings/interpretation; details in EPIC.

## 2021-04-22 ENCOUNTER — Encounter: Admit: 2021-04-22 | Discharge: 2021-04-25 | Payer: MEDICARE | Attending: Internal Medicine

## 2021-04-22 ENCOUNTER — Ambulatory Visit
Admit: 2021-04-22 | Discharge: 2021-04-25 | Disposition: A | Discharge status: Home / Self Care | Payer: MEDICARE | Admitting: Internal Medicine

## 2021-04-22 DIAGNOSIS — N189 Chronic kidney disease, unspecified: Secondary | ICD-10-CM | POA: Diagnosis not present

## 2021-04-22 DIAGNOSIS — E878 Other disorders of electrolyte and fluid balance, not elsewhere classified: Secondary | ICD-10-CM | POA: Diagnosis not present

## 2021-04-22 DIAGNOSIS — C641 Malignant neoplasm of right kidney, except renal pelvis: Secondary | ICD-10-CM | POA: Diagnosis not present

## 2021-04-22 DIAGNOSIS — Z803 Family history of malignant neoplasm of breast: Secondary | ICD-10-CM | POA: Diagnosis not present

## 2021-04-22 DIAGNOSIS — T451X5A Adverse effect of antineoplastic and immunosuppressive drugs, initial encounter: Secondary | ICD-10-CM | POA: Diagnosis not present

## 2021-04-22 DIAGNOSIS — N179 Acute kidney failure, unspecified: Secondary | ICD-10-CM | POA: Diagnosis not present

## 2021-04-22 DIAGNOSIS — C499 Malignant neoplasm of connective and soft tissue, unspecified: Secondary | ICD-10-CM | POA: Diagnosis not present

## 2021-04-22 DIAGNOSIS — Z20822 Contact with and (suspected) exposure to covid-19: Secondary | ICD-10-CM | POA: Diagnosis not present

## 2021-04-22 DIAGNOSIS — Z5111 Encounter for antineoplastic chemotherapy: Secondary | ICD-10-CM | POA: Diagnosis not present

## 2021-04-22 DIAGNOSIS — E785 Hyperlipidemia, unspecified: Secondary | ICD-10-CM | POA: Diagnosis not present

## 2021-04-22 DIAGNOSIS — Z905 Acquired absence of kidney: Secondary | ICD-10-CM | POA: Diagnosis not present

## 2021-04-22 DIAGNOSIS — Z79899 Other long term (current) drug therapy: Secondary | ICD-10-CM | POA: Diagnosis not present

## 2021-04-22 DIAGNOSIS — D61810 Antineoplastic chemotherapy induced pancytopenia: Secondary | ICD-10-CM | POA: Diagnosis not present

## 2021-04-22 DIAGNOSIS — C493 Malignant neoplasm of connective and soft tissue of thorax: Secondary | ICD-10-CM | POA: Diagnosis not present

## 2021-04-22 LAB — URINALYSIS
BACTERIA: NONE SEEN /HPF
BILIRUBIN UA: NEGATIVE
BLOOD UA: NEGATIVE
GLUCOSE UA: NEGATIVE
KETONES UA: NEGATIVE
LEUKOCYTE ESTERASE UA: NEGATIVE
NITRITE UA: NEGATIVE
PH UA: 6.5 (ref 5.0–9.0)
PROTEIN UA: NEGATIVE
RBC UA: <1 /HPF (ref <=3)
SPECIFIC GRAVITY UA: 1.02 (ref 1.003–1.030)
SQUAMOUS EPITHELIAL: <1 /HPF (ref 0–5)
UROBILINOGEN UA: <2
WBC UA: 1 /HPF (ref <=2)

## 2021-04-22 MED ADMIN — dexAMETHasone (DECADRON) tablet 12 mg: 12 mg | ORAL | @ 19:00:00 | Stop: 2021-04-25

## 2021-04-22 MED ADMIN — ondansetron (ZOFRAN) tablet 24 mg: 24 mg | ORAL | @ 19:00:00 | Stop: 2021-04-25

## 2021-04-22 MED ADMIN — enoxaparin (LOVENOX) syringe 40 mg: 40 mg | SUBCUTANEOUS | @ 19:00:00

## 2021-04-22 MED ADMIN — sodium chloride (NS) 0.9 % infusion: 125 mL/h | INTRAVENOUS | @ 19:00:00

## 2021-04-22 MED ADMIN — ifosfamide (IFEX) 4.26 g in sodium chloride (NS) 0.9 % 500 mL IVPB: 2 g/m2 | INTRAVENOUS | @ 22:00:00 | Stop: 2021-04-25

## 2021-04-22 MED ADMIN — sodium chloride (NS) 0.9 % infusion: 250 mL/h | INTRAVENOUS | @ 16:00:00 | Stop: 2021-04-22

## 2021-04-22 MED ADMIN — mesna (MESNEX) 1,421 mg in sodium chloride (NS) 0.9 % 50 mL IVPB: 667 mg/m2 | INTRAVENOUS | @ 21:00:00 | Stop: 2021-04-25

## 2021-04-22 MED ADMIN — aprepitant (EMEND) capsule 125 mg: 125 mg | ORAL | @ 19:00:00 | Stop: 2021-04-22

## 2021-04-22 MED ADMIN — DOXOrubicin (ADRIAMYCIN) syringe: 75 mg/m2 | INTRAVENOUS | @ 21:00:00 | Stop: 2021-04-22

## 2021-04-22 MED ADMIN — OLANZapine (ZyPREXA) tablet 5 mg: 5 mg | ORAL | @ 19:00:00 | Stop: 2021-04-22

## 2021-04-22 MED ADMIN — dexrazoxane HCL (ZINECARD) 1,599 mg in lactated Ringers 373.1 mL IVPB: 750 mg/m2 | INTRAVENOUS | @ 20:00:00 | Stop: 2021-04-22

## 2021-04-22 NOTE — Unmapped (Signed)
Oncology (MEDO) History & Physical    Assessment & Plan:   Jeremy Faulkner is a 65 y.o. male with PMHx of recently resected right kidney liposarcoma   that presented to [facility] for scheduled C3 of AIM chemotherapy.    Principal Problem:    Liposarcoma (CMS-HCC)  Resolved Problems:    * No resolved hospital problems. 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 Dorna Mai at [facility]. 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 S/p right nephrectomy June 2022. ??Recent ECHO with LVEF 55-60%. Continue C3 AIM with D1 bolus doxorubicin (vs. 3 day regimen) to reflect consensus in [facility] sarcoma program (interruptions in chemotherapy due to ifosfamide may delay doxorubicin administration, and doxorubicin has most data for anti-tumor effect in this combination regimen). Previously tolerated well without N/V, did endorse some hiccups. No pain.   -Admit for schedule chemotherapy regimen: C3D1 AIM (doxorubicin/ifosfamide/Mesna) on 10/12  -olanzapine 5 mg nightly following chemotherapy, zofran and compazine prn   -Has infusion appointment on 10/17 for G-CSF, consider possibility for self administration at home, will discuss with pharmacy regarding insurance   -follow-up with Dr. Dorna Mai scheduled for 11/1    -daily UA   -Labs: CMP x 1, BMP and CBC daily   -bowel regimen: miralax and senna   ??  HLD: On Rosuvastatin 20mg  every day at home. Held on admission but can restart on discharge.  ??  Acute Kidney Injury: Patient with worsening Creatinine since 6/25 in setting of right sided nephrectomy secondary to liposarcoma. Baseline since August appears to be ~1.5. Creatinine 1.62 on admission, will continue to monitor. -avoid nephrotoxic medications   ??  Impending Electrolyte Abnormality Secondary to Chemotherapy and/or IV Fluids  -Daily Electrolyte monitoring  -Replete per [facility] guidelines.??    Impending Pancytopenia secondary to chemotherapy:??  - Transfuse 1 unit of pRBCs for hgb >7  - Transfuse 1 unit plt for plts >10K??    Daily Checklist:  Diet: Regular Diet  DVT PPx: Lovenox 40mg  q24h  Electrolytes: No Repletion Needed  Code Status: Full Code    Chief Concern:   Liposarcoma (CMS-HCC)    Subjective:   HPI:  Jeremy Faulkner is a 65 y.o. male with PMHx of liposarcoma of the R kidney and hyperlipidemia. Originally diagnosed after he presented with N/V and abdominal and flank pain and found to have R kidney mass on CT A/P. Found to have R kidney liposarcoma s/p R nephrectomy. Underwent nephrectomy in June of 2022.  Started C1 AIM on 03/11/21. Tolerated without complication. No nausea or vomiting, some hiccups. Does not have any other medical conditions apart from high cholesterol which he takes a statin at home. Has been getting around well at home, very active and doing chores around the house and outdoors. Feeling well overall. Lives at home with his wife. Denies headache, chest pain, abdominal pain, flank pain, dysuria, change in bowel habits.     Oncology History   Liposarcoma (CMS-HCC)   03/06/2021 Initial Diagnosis    Liposarcoma (CMS-HCC)     03/11/2021 -  Chemotherapy    IP/OP SARCOMA DOXORUBICIN/IFOSFAMIDE/MESNA (AIM) (OP PEGFILGRASTIM)  DOXOrubicin 25 mg/m2 IV for 3 days and ifosfamide 2.5 g/m2 IV for 4 days, every 21 days. NOTE: On cycles 5 & 6, the  addition of dexrazoxane 250 mg/m2 and DOXOrubicin changes from CIVI to bolus.       Designated Environmental health practitioner:  Mr. Barca currently has decisional capacity for healthcare decision-making and is able to designate a surrogate healthcare decision maker. Mr. Stonebraker designated healthcare decision maker(s) is/are Webber Michiels (the patient's spouse) as denoted by stated patient preference.    Allergies:  Patient has no known allergies.    Medications:   Prior to Admission medications    Medication Dose, Route, Frequency   docusate sodium (COLACE) 100 MG capsule 100 mg, Oral, Daily (standard)   empty container (SHARPS CONTAINER) Misc Use as directed   OLANZapine (ZYPREXA) 5 MG tablet 5 mg, Oral, Nightly, Take nightly for 3 days following chemotherapy to prevent nausea.   ondansetron (ZOFRAN) 8 MG tablet 8 mg, Oral, Every 8 hours PRN   pegfilgrastim-bmez (ZIEXTENZO) 6 mg/0.6 mL injection Inject 1 syringe (0.62mL) under the skin once every 21 days. Inject 24-72h after last chemotherapy dose on day 5.   prochlorperazine (COMPAZINE) 10 MG tablet 10 mg, Oral, Every 6 hours PRN   rosuvastatin (CRESTOR) 20 MG tablet 1 tablet, Oral, Daily (standard)       Medical History:  No past medical history on file.    Surgical History:  Past Surgical History:   Procedure Laterality Date   ??? IR INSERT PORT AGE GREATER THAN 5 YRS  03/09/2021    IR INSERT PORT AGE GREATER THAN 5 YRS 03/09/2021 Trude Mcburney, MD IMG [facility]IR [facility]       Family History: No family history of cancer reported.   No family history on file.    Social History:  The patient lives with family    Social History     Tobacco Use   ??? Smoking status: Never Smoker   ??? Smokeless tobacco: Never Used        Review of Systems:  10 systems were reviewed and are negative unless otherwise mentioned in the HPI    Objective:   Physical Exam:  Temp:  [36.8 ??C (98.2 ??F)] 36.8 ??C (98.2 ??F)  Heart Rate:  [85] 85  Resp:  [18] 18  BP: (136)/(69) 136/69    Gen: Sitting up in NAD, answers questions appropriately  Eyes: Sclera anicteric, EOMI   HENT: atraumatic, normocephalic, MMM  Heart: RRR, S1, S2, no M/R/G, no chest wall tenderness  Lungs: CTAB, no crackles or wheezes, no use of accessory muscles  Abdomen: Normoactive bowel sounds, soft, NTND, no rebound/guarding, no hepatosplenomegaly  Extremities: no clubbing, cyanosis, or edema  Neuro: CN II-XI grossly intact, No focal deficits.  Skin:  No rashes, lesions on clothed exam. Port in place c/d/i  Psych: Alert, oriented, normal mood and affect.     Labs/Studies/Imaging:  Labs, Studies, Imaging from the last 24hrs per EMR and personally reviewed

## 2021-04-22 NOTE — Unmapped (Signed)
Brief Oncology Communication:    Called by infusion nurses to review labs and place okay to treat order.    Dx: dedifferentiated liposarcoma of right kidney  Regimen: AIM (doxorubicin/ifosamide/Mesna)  Cycle: C3  Labs: Reviewed CBC and CMP from 10/12, urinalysis pending  Primary Oncologist: [facility]  Access: Right chest port  G-CSF Plan: Has infusion appointment on 10/17 for G-CSF, inpatient pharmacist also to investigate home injection  Follow Up Plan: Has followup on 11/1    Additional relevant considerations:    Chemo orders, labs, weight, vital signs reviewed. Patient examined with findings as below:    Vitals:    04/22/21 1100   BP: 136/69   Pulse: 85   Resp: 18   Temp: 36.8 ??C (98.2 ??F)     Gen: comfortable, NAD.  Eyes: EOMI, sclera anicteric  ENT:  MMM.   Neck: Supple, trachea midline  Lymph: No cervical, submandibular, or supraclavicular lymphadenopathy   CV: RRR no murmurs   Lungs: CTAB w/o rales, rhochi, or wheezing, good respiratory effort   GI: +BS, Soft, NT, ND, no masses appreciated   Ext: Warm and well perfused. No cyanosis, clubbing, or edema bilaterally   Skin: no rashes or lesions, port in place without surrounding erythema, well healed incision  Neuro: Moving all extremities, speech fluid. CNII-XII grossly intact.   Psych: mood euthymic and affect appropriate   MSK: No focal bony deformaties or tenderness    OK to treat order placed. Proceed with chemotherapy. Patient will be admitted to MED O.    Sherin Quarry, MD PhD  Hematology/Oncology Fellow, PGY-4

## 2021-04-23 DIAGNOSIS — E785 Hyperlipidemia, unspecified: Secondary | ICD-10-CM | POA: Diagnosis not present

## 2021-04-23 DIAGNOSIS — N179 Acute kidney failure, unspecified: Secondary | ICD-10-CM | POA: Diagnosis not present

## 2021-04-23 DIAGNOSIS — C641 Malignant neoplasm of right kidney, except renal pelvis: Secondary | ICD-10-CM | POA: Diagnosis not present

## 2021-04-23 DIAGNOSIS — C499 Malignant neoplasm of connective and soft tissue, unspecified: Secondary | ICD-10-CM | POA: Diagnosis not present

## 2021-04-23 DIAGNOSIS — Z5111 Encounter for antineoplastic chemotherapy: Secondary | ICD-10-CM | POA: Diagnosis not present

## 2021-04-23 LAB — CBC W/ AUTO DIFF
BASOPHILS ABSOLUTE COUNT: 0 10*9/L (ref 0.0–0.1)
BASOPHILS RELATIVE PERCENT: 0.6 %
EOSINOPHILS ABSOLUTE COUNT: 0 10*9/L (ref 0.0–0.5)
EOSINOPHILS RELATIVE PERCENT: 0 %
HEMATOCRIT: 28 % — ABNORMAL LOW (ref 39.0–48.0)
HEMOGLOBIN: 10 g/dL — ABNORMAL LOW (ref 12.9–16.5)
LYMPHOCYTES ABSOLUTE COUNT: 0.4 10*9/L — ABNORMAL LOW (ref 1.1–3.6)
LYMPHOCYTES RELATIVE PERCENT: 7.1 %
MEAN CORPUSCULAR HEMOGLOBIN CONC: 35.5 g/dL (ref 32.0–36.0)
MEAN CORPUSCULAR HEMOGLOBIN: 30.3 pg (ref 25.9–32.4)
MEAN CORPUSCULAR VOLUME: 85.3 fL (ref 77.6–95.7)
MEAN PLATELET VOLUME: 6.6 fL — ABNORMAL LOW (ref 6.8–10.7)
MONOCYTES ABSOLUTE COUNT: 0 10*9/L — ABNORMAL LOW (ref 0.3–0.8)
MONOCYTES RELATIVE PERCENT: 0.5 %
NEUTROPHILS ABSOLUTE COUNT: 4.6 10*9/L (ref 1.8–7.8)
NEUTROPHILS RELATIVE PERCENT: 91.8 %
PLATELET COUNT: 183 10*9/L (ref 150–450)
RED BLOOD CELL COUNT: 3.29 10*12/L — ABNORMAL LOW (ref 4.26–5.60)
RED CELL DISTRIBUTION WIDTH: 16.2 % — ABNORMAL HIGH (ref 12.2–15.2)
WBC ADJUSTED: 5 10*9/L (ref 3.6–11.2)

## 2021-04-23 LAB — HEPATIC FUNCTION PANEL
ALBUMIN: 3.2 g/dL — ABNORMAL LOW (ref 3.4–5.0)
ALKALINE PHOSPHATASE: 44 U/L — ABNORMAL LOW (ref 46–116)
ALT (SGPT): 20 U/L (ref 10–49)
AST (SGOT): 20 U/L (ref <=34)
BILIRUBIN DIRECT: 0.1 mg/dL (ref 0.00–0.30)
BILIRUBIN TOTAL: 0.4 mg/dL (ref 0.3–1.2)
PROTEIN TOTAL: 5.2 g/dL — ABNORMAL LOW (ref 5.7–8.2)

## 2021-04-23 LAB — URINALYSIS
BACTERIA: NONE SEEN /HPF
BILIRUBIN UA: NEGATIVE
BLOOD UA: NEGATIVE
KETONES UA: >150 — AB
LEUKOCYTE ESTERASE UA: NEGATIVE
NITRITE UA: NEGATIVE
PH UA: 5.5 (ref 5.0–9.0)
PROTEIN UA: NEGATIVE
RBC UA: <1 /HPF (ref <=3)
SPECIFIC GRAVITY UA: 1.025 (ref 1.003–1.030)
SQUAMOUS EPITHELIAL: <1 /HPF (ref 0–5)
UROBILINOGEN UA: <2
WBC UA: <1 /HPF (ref <=2)

## 2021-04-23 LAB — BASIC METABOLIC PANEL
ANION GAP: 6 mmol/L (ref 5–14)
BLOOD UREA NITROGEN: 20 mg/dL (ref 9–23)
BUN / CREAT RATIO: 12
CALCIUM: 8 mg/dL — ABNORMAL LOW (ref 8.7–10.4)
CHLORIDE: 113 mmol/L — ABNORMAL HIGH (ref 98–107)
CO2: 23 mmol/L (ref 20.0–31.0)
CREATININE: 1.72 mg/dL — ABNORMAL HIGH
EGFR CKD-EPI (2021) MALE: 43 mL/min/{1.73_m2} — ABNORMAL LOW (ref >=60)
GLUCOSE RANDOM: 174 mg/dL (ref 70–179)
POTASSIUM: 5.1 mmol/L — ABNORMAL HIGH (ref 3.4–4.8)
SODIUM: 142 mmol/L (ref 135–145)

## 2021-04-23 MED ADMIN — baclofen (LIORESAL) tablet 5 mg: 5 mg | ORAL | @ 20:00:00

## 2021-04-23 MED ADMIN — mesna (MESNEX) 1,421 mg in sodium chloride (NS) 0.9 % 50 mL IVPB: 667 mg/m2 | INTRAVENOUS | @ 04:00:00 | Stop: 2021-04-25

## 2021-04-23 MED ADMIN — dexAMETHasone (DECADRON) tablet 12 mg: 12 mg | ORAL | @ 21:00:00 | Stop: 2021-04-25

## 2021-04-23 MED ADMIN — mesna (MESNEX) 1,421 mg in sodium chloride (NS) 0.9 % 50 mL IVPB: 667 mg/m2 | INTRAVENOUS | @ 21:00:00 | Stop: 2021-04-25

## 2021-04-23 MED ADMIN — sodium chloride (NS) 0.9 % flush 10 mL: 10 mL | INTRAVENOUS

## 2021-04-23 MED ADMIN — sodium chloride (NS) 0.9 % flush 10 mL: 10 mL | INTRAVENOUS | @ 12:00:00

## 2021-04-23 MED ADMIN — mesna (MESNEX) 1,421 mg in sodium chloride (NS) 0.9 % 50 mL IVPB: 667 mg/m2 | INTRAVENOUS | @ 08:00:00 | Stop: 2021-04-26

## 2021-04-23 MED ADMIN — ondansetron (ZOFRAN) tablet 24 mg: 24 mg | ORAL | @ 21:00:00 | Stop: 2021-04-25

## 2021-04-23 MED ADMIN — aprepitant (EMEND) capsule 80 mg: 80 mg | ORAL | @ 21:00:00 | Stop: 2021-04-25

## 2021-04-23 MED ADMIN — ifosfamide (IFEX) 4.26 g in sodium chloride (NS) 0.9 % 500 mL IVPB: 2 g/m2 | INTRAVENOUS | @ 22:00:00 | Stop: 2021-04-25

## 2021-04-23 MED ADMIN — sodium chloride (NS) 0.9 % infusion: 125 mL/h | INTRAVENOUS

## 2021-04-23 MED ADMIN — sodium chloride (NS) 0.9 % infusion: 125 mL/h | INTRAVENOUS | @ 16:00:00

## 2021-04-23 MED ADMIN — baclofen (LIORESAL) tablet 5 mg: 5 mg | ORAL | @ 17:00:00

## 2021-04-23 NOTE — Unmapped (Signed)
Oncology (MEDO) Progress Note    Assessment & Plan:   Jeremy Faulkner is a 65 y.o. male with a PMHx of recently resected right kidney liposarcoma   that presented to [facility] for scheduled C3 of AIM chemotherapy.    Principal Problem:    Liposarcoma (CMS-HCC)  Resolved Problems:    * No resolved hospital problems. 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 Dorna Mai at [facility]. 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 S/p right nephrectomy June 2022. ??Recent ECHO with LVEF 55-60%.??Continue C3??AIM with D1 bolus doxorubicin (vs. 3 day regimen) to reflect consensus in [facility] sarcoma program (interruptions in chemotherapy due to ifosfamide may delay doxorubicin administration, and doxorubicin has most data for anti-tumor effect in this combination regimen). Previously tolerated well without N/V, did endorse some hiccups. Tolerating well thus far, denies side effects.   -Admit for schedule chemotherapy regimen: C3D1 AIM (doxorubicin/ifosfamide/Mesna) on 10/12  -olanzapine 5 mg nightly following chemotherapy, zofran and compazine prn   -Has infusion appointment on 10/17 for G-CSF, consider possibility for self administration at home, will discuss with pharmacy regarding insurance   -follow-up with Dr. Dorna Mai scheduled for 11/1    -daily UA   -Labs: CMP x 1, BMP and CBC daily   -bowel regimen: miralax and senna   ??  HLD: On Rosuvastatin 20mg  every day at home. Held on admission but can restart on discharge.  ??  Acute Kidney Injury: Patient with worsening Creatinine since 6/25 in setting of right sided nephrectomy secondary to liposarcoma. Baseline since August appears to be ~1.5. Creatinine 1.62??on admission, will continue to monitor.    -avoid nephrotoxic medications??  ??  Impending Electrolyte Abnormality Secondary to Chemotherapy and/or IV Fluids  -Daily Electrolyte monitoring  -Replete per [facility] guidelines.??  ??  Impending Pancytopenia secondary to chemotherapy:??  - Transfuse 1 unit of pRBCs for hgb >7  - Transfuse 1 unit plt for plts >10K??    Daily Checklist:  Diet: Regular Diet  DVT PPx: Lovenox 40mg  q24h  Electrolytes: No Repletion Needed  Code Status: Full Code  Dispo: Goal Discharge: after chemo completion    Team Contact Information:   Primary Team: Oncology (MEDO)  Primary Resident: George Hugh, MD  Resident's Pager: [phone] (Oncology Intern - Cliffton Asters)    Interval History:   No acute events overnight. Tolerated chemo well. No N/V. No abdominal pain. No hiccups.    ROS: Denies headache, chest pain, shortness of breath, abdominal pain, nausea, vomiting.    Objective:   Temp:  [36.3 ??C (97.3 ??F)-37 ??C (98.6 ??F)] 36.4 ??C (97.5 ??F)  Heart Rate:  [67-85] 75  Resp:  [16-18] 18  BP: (95-136)/(57-70) 115/70  SpO2:  [94 %-99 %] 97 %    Gen: Sitting up in NAD, answers questions appropriately  Eyes: sclera anicteric, EOMI  HENT: atraumatic, MMM,   Heart: RRR, S1, S2, no M/R/G, no chest wall tenderness  Lungs: CTAB, no crackles or wheezes, no use of accessory muscles  Abdomen: soft, NTND, no rebound/guarding  Extremities: no clubbing, cyanosis, or edema in the BLEs  Psych: Alert, oriented, appropriate mood and affect    Labs/Studies: Labs and Studies from the last 24hrs per EMR and Reviewed

## 2021-04-23 NOTE — Unmapped (Signed)
Case Management Brief Assessment      General:  Care Manager assessed the patient by : Medical record review, Discussion with Clinical Care team    Pt here for scheduled Chemo. No discharge planning needs anticipated at this time.    Extended Emergency Contact Information  Primary Emergency Contact: Reader,Karen  Mobile Phone: [phone]  Relation: Spouse    Financial Information:  Need for financial assistance?: No    Discharge Needs:  Concerns to be Addressed: no discharge needs identified    Clinical risk factors: > 65, Principal Diagnosis: Cancer, Stroke, COPD, Heart Failure, AMI, Pneumonia, Joint Replacment    Discharge Plan:  Screen findings are: Care Manager reviewed the plan of the patient's care with the Multidisciplinary Team. No discharge planning needs identified at this time. Care Manager will continue to manage plan and monitor patient's progress with the team.    Estimated Discharge Date: 04/26/2021    Initial Assessment complete?: Yes    Additional Information:    Social Determinants of Health     Tobacco Use: Low Risk    ??? Smoking Tobacco Use: Never Smoker   ??? Smokeless Tobacco Use: Never Used   Alcohol Use: Not on file   Financial Resource Strain: Low Risk    ??? Difficulty of Paying Living Expenses: Not very hard   Food Insecurity: No Food Insecurity   ??? Worried About Running Out of Food in the Last Year: Never true   ??? Ran Out of Food in the Last Year: Never true   Transportation Needs: No Transportation Needs   ??? Lack of Transportation (Medical): No   ??? Lack of Transportation (Non-Medical): No   Physical Activity: Not on file   Stress: Not on file   Social Connections: Not on file   Intimate Partner Violence: Not on file   Depression: Not on file   Housing/Utilities: Low Risk    ??? Within the past 12 months, have you ever stayed: outside, in a car, in a tent, in an overnight shelter, or temporarily in someone else's home (i.e. couch-surfing)?: No   ??? Are you worried about losing your housing?: No   ??? Within the past 12 months, have you been unable to get utilities (heat, electricity) when it was really needed?: No   Substance Use: Not on file   Health Literacy: Not on file       Predictive Model Details          22% (Medium)  Factor Value    Calculated 04/23/2021 08:10 23% Number of active Rx orders 36    [place] Risk of Unplanned Readmission Model 10% Diagnosis of cancer present     9% Number of hospitalizations in last year 2     9% Active antipsychotic Rx order present     9% ECG/EKG order present in last 6 months     8% Latest calcium low (8.0 mg/dL)     6% Imaging order present in last 6 months     6% Latest hemoglobin low (10.0 g/dL)     5% Age 6     4% Active anticoagulant Rx order present     4% Active corticosteroid Rx order present     4% Latest creatinine high (1.72 mg/dL)     2% Charlson Comorbidity Index 2     2% Future appointment scheduled     1% Active ulcer medication Rx order present     1% Current length of stay 0.793 days

## 2021-04-23 NOTE — Unmapped (Signed)
Adult Nutrition Assessment Note    Visit Type: MD Consult  Reason for Visit: Assessment (Nutrition)      HPI & PMH:  Jeremy Faulkner is a 65 y.o. male with PMHx of recently resected right kidney liposarcoma   that presented to [facility] for scheduled C3 of AIM chemotherapy.  No past medical history on file.      Anthropometric Data:  Height: 189.5 cm (6' 2.61)   Admission weight: 90.4 kg (199 lb 6.4 oz)  Last recorded weight: 90.4 kg (199 lb 6.4 oz)  IBW: 87.88 kg  Percent IBW: 102.92 %  BMI: Body mass index is 25.19 kg/m??.   Usual Body Weight: no usual weight status    Weight history prior to admission: Overall weight increases; Weight 9/23 more reflective of fluid with chemo cycle   Wt Readings from Last 10 Encounters:   04/22/21 90.4 kg (199 lb 6.4 oz)   04/21/21 90.3 kg (199 lb)   04/03/21 93.3 kg (205 lb 11.2 oz)   03/31/21 89.3 kg (196 lb 14.4 oz)   03/11/21 89.5 kg (197 lb 6.4 oz)   03/10/21 88.8 kg (195 lb 12.8 oz)   02/17/21 86.5 kg (190 lb 11.2 oz)        Weight changes this admission:   Last 5 Recorded Weights    04/22/21 1100   Weight: 90.4 kg (199 lb 6.4 oz)        Nutrition Focused Physical Exam:             Nutrition Evaluation  Overall Impressions: Nutrition-Focused Physical Exam not indicated due to lack of malnutrition risk factors. (04/23/21 1610)  Nutrition Designation: Overweight (BMI 25.00 - 29.99 kg/m2) (04/23/21 0823)         NUTRITIONALLY RELEVANT DATA     Medications:   Nutritionally pertinent medications reviewed and evaluated for potential food and/or medication interactions.     Labs:   Nutritionally pertinent labs reviewed.     Nutrition History:   April 23, 2021: Prior to admission: Contact via Phone, interview deferred to Pt's wife, reports Pt been eating well past 3 chemo cycles. His overall weight has increased. We discussed K+ level trending up and diet.     Allergies, Intolerances, Sensitivities, and/or Cultural/Religious Dietary Restrictions: none identified at this time Current Nutrition:  Oral intake        Nutrition Orders   (From admission, onward)             Start     Ordered    04/22/21 1441  Nutrition Therapy Regular/House  Effective now        Question:  Nutrition Therapy:  Answer:  Regular/House    04/22/21 1442                   Nutritional Needs:   Energy: 2250-2700 kcals 25-30 kcal/kg using admission body weight, 90 kg (04/23/21 0534)]  Protein: 90-135 gm [1.0-1.2 gm/kg, 1.2-1.5 gm/kg using admission body weight, 90 kg (04/23/21 0534)]  Carbohydrate:   [no restriction]  Fluid: 3270 ( ) mL [BSA X 1000-1500 mL]      Malnutrition Assessment using AND/ASPEN Clinical Characteristics:    Patient does not meet AND/ASPEN criteria for malnutrition at this time (04/23/21 9604)       GOALS and EVALUATION     ??? No goals to attains at present    Motivation, Barriers, and Compliance:  Evaluation of motivation, barriers, and compliance completed. No concerns identified at this time.  NUTRITION ASSESSMENT     ??? Creat (1.72) monitoring, Pt on IV fluids with chemo regimen. PO intake WNL.  ??? K (5.1) trending up, may likely be impacted by chemo regimen. Discussed monitoring diet temporarialy.  ??? Pt overall stable nutrition. RD will continue monitor nutrition status course of admission.      Discharge Planning:   Monitor via CAPP rounds for any discharge planning needs.  Monitor for potential discharge needs with multi-disciplinary team.       NUTRITION INTERVENTIONS and RECOMMENDATION     1. Continue regular diet  2. Encouraged limited excessive juice or fruit intake at present, monitoring K level  3. Continue regular weight checks    Follow-Up Parameters:   1-2 times per 4 week period (and more frequent as indicated)    Ed Blalock, MS, RD, CSO, LDN  Pager # [phone]

## 2021-04-23 NOTE — Unmapped (Incomplete)
Problem: Adult Inpatient Plan of Care  Goal: Plan of Care Review  Outcome: Ongoing - Unchanged  Goal: Patient-Specific Goal (Individualized)  Outcome: Ongoing - Unchanged  Goal: Absence of Hospital-Acquired Illness or Injury  Outcome: Ongoing - Unchanged  Intervention: Identify and Manage Fall Risk  Recent Flowsheet Documentation  Taken 04/22/2021 1924 by Azzie Almas, RN  Safety Interventions:   chemotherapeutic agent precautions   commode/urinal/bedpan at bedside   family at bedside   lighting adjusted for tasks/safety   low bed   nonskid shoes/slippers when out of bed  Intervention: Prevent and Manage VTE (Venous Thromboembolism) Risk  Recent Flowsheet Documentation  Taken 04/22/2021 1924 by Azzie Almas, RN  Activity Management:   activity adjusted per tolerance   activity encouraged  Goal: Optimal Comfort and Wellbeing  Outcome: Ongoing - Unchanged  Goal: Readiness for Transition of Care  Outcome: Ongoing - Unchanged  Goal: Rounds/Family Conference  Outcome: Ongoing - Unchanged     Problem: Pain Chronic (Persistent) (Comorbidity Management)  Goal: Acceptable Pain Control and Functional Ability  Outcome: Ongoing - Unchanged

## 2021-04-23 NOTE — Unmapped (Signed)
Patient admitted to [facility] from infusion clinic for cycle 3 d1 of IP/OP SARCOMA DOXORUBICIN/IFOSFAMIDE/MESNA (AIM); tolerating well thus far. Continuous fluids infusing per order. No acute events at this time    Problem: Adult Inpatient Plan of Care  Goal: Plan of Care Review  Outcome: Ongoing - Unchanged  Goal: Patient-Specific Goal (Individualized)  Outcome: Ongoing - Unchanged  Goal: Absence of Hospital-Acquired Illness or Injury  Outcome: Ongoing - Unchanged  Goal: Optimal Comfort and Wellbeing  Outcome: Ongoing - Unchanged  Goal: Readiness for Transition of Care  Outcome: Ongoing - Unchanged  Goal: Rounds/Family Conference  Outcome: Ongoing - Unchanged     Problem: Pain Chronic (Persistent) (Comorbidity Management)  Goal: Acceptable Pain Control and Functional Ability  Outcome: Ongoing - Unchanged

## 2021-04-24 DIAGNOSIS — C499 Malignant neoplasm of connective and soft tissue, unspecified: Principal | ICD-10-CM

## 2021-04-24 DIAGNOSIS — E785 Hyperlipidemia, unspecified: Secondary | ICD-10-CM | POA: Diagnosis not present

## 2021-04-24 DIAGNOSIS — C641 Malignant neoplasm of right kidney, except renal pelvis: Secondary | ICD-10-CM | POA: Diagnosis not present

## 2021-04-24 DIAGNOSIS — Z5111 Encounter for antineoplastic chemotherapy: Secondary | ICD-10-CM | POA: Diagnosis not present

## 2021-04-24 DIAGNOSIS — N189 Chronic kidney disease, unspecified: Secondary | ICD-10-CM | POA: Diagnosis not present

## 2021-04-24 LAB — BASIC METABOLIC PANEL
ANION GAP: 6 mmol/L (ref 5–14)
BLOOD UREA NITROGEN: 24 mg/dL — ABNORMAL HIGH (ref 9–23)
BUN / CREAT RATIO: 15
CALCIUM: 8.2 mg/dL — ABNORMAL LOW (ref 8.7–10.4)
CHLORIDE: 114 mmol/L — ABNORMAL HIGH (ref 98–107)
CO2: 22 mmol/L (ref 20.0–31.0)
CREATININE: 1.61 mg/dL — ABNORMAL HIGH
EGFR CKD-EPI (2021) MALE: 47 mL/min/{1.73_m2} — ABNORMAL LOW (ref >=60)
GLUCOSE RANDOM: 128 mg/dL (ref 70–179)
POTASSIUM: 5 mmol/L — ABNORMAL HIGH (ref 3.4–4.8)
SODIUM: 142 mmol/L (ref 135–145)

## 2021-04-24 LAB — CBC W/ AUTO DIFF
BASOPHILS ABSOLUTE COUNT: 0 10*9/L (ref 0.0–0.1)
BASOPHILS RELATIVE PERCENT: 0.1 %
EOSINOPHILS ABSOLUTE COUNT: 0 10*9/L (ref 0.0–0.5)
EOSINOPHILS RELATIVE PERCENT: 0 %
HEMATOCRIT: 25.2 % — ABNORMAL LOW (ref 39.0–48.0)
HEMOGLOBIN: 8.9 g/dL — ABNORMAL LOW (ref 12.9–16.5)
LYMPHOCYTES ABSOLUTE COUNT: 0.2 10*9/L — ABNORMAL LOW (ref 1.1–3.6)
LYMPHOCYTES RELATIVE PERCENT: 3.5 %
MEAN CORPUSCULAR HEMOGLOBIN CONC: 35.5 g/dL (ref 32.0–36.0)
MEAN CORPUSCULAR HEMOGLOBIN: 30.2 pg (ref 25.9–32.4)
MEAN CORPUSCULAR VOLUME: 85.1 fL (ref 77.6–95.7)
MEAN PLATELET VOLUME: 6.8 fL (ref 6.8–10.7)
MONOCYTES ABSOLUTE COUNT: 0.3 10*9/L (ref 0.3–0.8)
MONOCYTES RELATIVE PERCENT: 4.3 %
NEUTROPHILS ABSOLUTE COUNT: 6.4 10*9/L (ref 1.8–7.8)
NEUTROPHILS RELATIVE PERCENT: 92.1 %
PLATELET COUNT: 178 10*9/L (ref 150–450)
RED BLOOD CELL COUNT: 2.96 10*12/L — ABNORMAL LOW (ref 4.26–5.60)
RED CELL DISTRIBUTION WIDTH: 16.4 % — ABNORMAL HIGH (ref 12.2–15.2)
WBC ADJUSTED: 6.9 10*9/L (ref 3.6–11.2)

## 2021-04-24 LAB — URINALYSIS
BACTERIA: NONE SEEN /HPF
BILIRUBIN UA: NEGATIVE
BLOOD UA: NEGATIVE
GLUCOSE UA: NEGATIVE
KETONES UA: 100 — AB
LEUKOCYTE ESTERASE UA: NEGATIVE
NITRITE UA: NEGATIVE
PH UA: 6 (ref 5.0–9.0)
PROTEIN UA: NEGATIVE
RBC UA: <1 /HPF (ref <=3)
SPECIFIC GRAVITY UA: 1.013 (ref 1.003–1.030)
SQUAMOUS EPITHELIAL: <1 /HPF (ref 0–5)
UROBILINOGEN UA: <2
WBC UA: <1 /HPF (ref <=2)

## 2021-04-24 MED ORDER — PEGFILGRASTIM-BMEZ 6 MG/0.6 ML SUBCUTANEOUS SYRINGE
3 refills | 0 days | Status: CP
Start: 2021-04-24 — End: ?

## 2021-04-24 MED ADMIN — OLANZapine (ZyPREXA) tablet 5 mg: 5 mg | ORAL | Stop: 2021-04-25

## 2021-04-24 MED ADMIN — sodium chloride (NS) 0.9 % flush 10 mL: 10 mL | INTRAVENOUS

## 2021-04-24 MED ADMIN — ondansetron (ZOFRAN) tablet 24 mg: 24 mg | ORAL | @ 21:00:00 | Stop: 2021-04-24

## 2021-04-24 MED ADMIN — mesna (MESNEX) 1,421 mg in sodium chloride (NS) 0.9 % 50 mL IVPB: 667 mg/m2 | INTRAVENOUS | @ 08:00:00 | Stop: 2021-04-26

## 2021-04-24 MED ADMIN — mesna (MESNEX) 1,421 mg in sodium chloride (NS) 0.9 % 50 mL IVPB: 667 mg/m2 | INTRAVENOUS | @ 21:00:00 | Stop: 2021-04-24

## 2021-04-24 MED ADMIN — dexAMETHasone (DECADRON) tablet 12 mg: 12 mg | ORAL | @ 21:00:00 | Stop: 2021-04-24

## 2021-04-24 MED ADMIN — ifosfamide (IFEX) 4.26 g in sodium chloride (NS) 0.9 % 500 mL IVPB: 2 g/m2 | INTRAVENOUS | @ 21:00:00 | Stop: 2021-04-24

## 2021-04-24 MED ADMIN — sodium chloride (NS) 0.9 % flush 10 mL: 10 mL | INTRAVENOUS | @ 13:00:00

## 2021-04-24 MED ADMIN — aprepitant (EMEND) capsule 80 mg: 80 mg | ORAL | @ 21:00:00 | Stop: 2021-04-24

## 2021-04-24 MED ADMIN — enoxaparin (LOVENOX) syringe 40 mg: 40 mg | SUBCUTANEOUS | @ 21:00:00

## 2021-04-24 MED ADMIN — mesna (MESNEX) 1,421 mg in sodium chloride (NS) 0.9 % 50 mL IVPB: 667 mg/m2 | INTRAVENOUS | @ 04:00:00 | Stop: 2021-04-25

## 2021-04-24 MED ADMIN — sodium chloride (NS) 0.9 % infusion: 125 mL/h | INTRAVENOUS | @ 08:00:00

## 2021-04-24 NOTE — Unmapped (Signed)
Jeremy Faulkner reported no issues this shift. VSS. Wife at bedside. Chemo precautions maintained.     Problem: Adult Inpatient Plan of Care  Goal: Plan of Care Review  Outcome: Ongoing - Unchanged     Problem: Fatigue (Oncology Care)  Goal: Improved Activity Tolerance  Outcome: Ongoing - Unchanged

## 2021-04-24 NOTE — Unmapped (Signed)
Pt AxOx4, up ad lib. NS @ 125 mL/hr. Chemo precautions.     Problem: Adult Inpatient Plan of Care  Goal: Plan of Care Review  Outcome: Ongoing - Unchanged  Goal: Patient-Specific Goal (Individualized)  Outcome: Ongoing - Unchanged  Goal: Absence of Hospital-Acquired Illness or Injury  Outcome: Ongoing - Unchanged  Intervention: Identify and Manage Fall Risk  Recent Flowsheet Documentation  Taken 04/23/2021 1945 by Azzie Almas, RN  Safety Interventions:  ??? chemotherapeutic agent precautions  ??? lighting adjusted for tasks/safety  ??? low bed  ??? nonskid shoes/slippers when out of bed  Intervention: Prevent Skin Injury  Recent Flowsheet Documentation  Taken 04/23/2021 1945 by Azzie Almas, RN  Skin Protection: adhesive use limited  Intervention: Prevent and Manage VTE (Venous Thromboembolism) Risk  Recent Flowsheet Documentation  Taken 04/23/2021 1945 by Azzie Almas, RN  Activity Management: activity adjusted per tolerance  Goal: Optimal Comfort and Wellbeing  Outcome: Ongoing - Unchanged  Goal: Readiness for Transition of Care  Outcome: Ongoing - Unchanged  Goal: Rounds/Family Conference  Outcome: Ongoing - Unchanged     Problem: Pain Chronic (Persistent) (Comorbidity Management)  Goal: Acceptable Pain Control and Functional Ability  Outcome: Ongoing - Unchanged

## 2021-04-24 NOTE — Unmapped (Signed)
Shandy Vi 's Ziextenzo shipment will be canceled  as a result of a high copay. Nex injection due will be administered in clinic and subsequent refills will be delivered directly to the patient via Mfg assistance program.    I have not reached out to the patient and communicated the delivery change. We will not reschedule the medication and have removed this/these medication(s) from the work request.  We have canceled this work request.  Patient will be notified by MAPS team.    Horace Porteous, PharmD  [facility]

## 2021-04-24 NOTE — Unmapped (Signed)
Specialty Medication(s): Ziextenzo    Mr.Seder has been dis-enrolled from the [facility] specialty pharmacy services due to enrollment in a manufacturer assistance program that sends medicine directly to the patient.    Additional information provided to the patient: NA    Mfr assistance approved through 07/11/21 by [facility] pharmacy.  Patient can follow up with manufacturer if needed via phone# [phone].    Kermit Balo  [facility] Specialty Pharmacist

## 2021-04-25 DIAGNOSIS — E785 Hyperlipidemia, unspecified: Secondary | ICD-10-CM | POA: Diagnosis not present

## 2021-04-25 DIAGNOSIS — Z5111 Encounter for antineoplastic chemotherapy: Secondary | ICD-10-CM | POA: Diagnosis not present

## 2021-04-25 DIAGNOSIS — C641 Malignant neoplasm of right kidney, except renal pelvis: Secondary | ICD-10-CM | POA: Diagnosis not present

## 2021-04-25 DIAGNOSIS — N189 Chronic kidney disease, unspecified: Secondary | ICD-10-CM | POA: Diagnosis not present

## 2021-04-25 MED ADMIN — sodium chloride (NS) 0.9 % flush 10 mL: 10 mL | INTRAVENOUS | @ 01:00:00

## 2021-04-25 MED ADMIN — OLANZapine (ZyPREXA) tablet 5 mg: 5 mg | ORAL | @ 01:00:00 | Stop: 2021-04-24

## 2021-04-25 MED ADMIN — mesna (MESNEX) 1,421 mg in sodium chloride (NS) 0.9 % 50 mL IVPB: 667 mg/m2 | INTRAVENOUS | @ 04:00:00 | Stop: 2021-04-25

## 2021-04-25 NOTE — Unmapped (Cosign Needed)
Oncology (MEDO) Progress Note    Assessment & Plan:   Jeremy Faulkner is a 65 y.o. male with a PMHx of recently resected right kidney liposarcoma   that presented to [facility] for scheduled C3 of AIM chemotherapy.    Principal Problem:    Liposarcoma (CMS-HCC)  Resolved Problems:    * No resolved hospital problems. 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 Dorna Mai at [facility]. 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 S/p right nephrectomy June 2022. ??Recent ECHO with LVEF 55-60%.??Continue C3??AIM with D1 bolus doxorubicin (vs. 3 day regimen) to reflect consensus in [facility] sarcoma program (interruptions in chemotherapy due to ifosfamide may delay doxorubicin administration, and doxorubicin has most data for anti-tumor effect in this combination regimen). Previously tolerated well without N/V, did endorse some hiccups. Tolerating well thus far, denies side effects.   -Admit for schedule chemotherapy regimen: C3D1 AIM (doxorubicin/ifosfamide/Mesna) on 10/12  -olanzapine 5 mg nightly following chemotherapy, zofran and compazine prn   -Has infusion appointment on 10/17 for G-CSF, consider possibility for self administration at home, will discuss with pharmacy regarding insurance   -follow-up with Dr. Dorna Mai scheduled for 11/1    -daily UA   -Labs: CMP x 1, BMP and CBC daily   -bowel regimen: miralax and senna   ??  HLD: On Rosuvastatin 20mg  every day at home. Held on admission but can restart on discharge.  ??  Chronic Kidney Disease: Patient with worsening Creatinine since 6/25 in setting of right sided nephrectomy secondary to liposarcoma. Baseline since August appears to be ~1.5. Creatinine 1.62??on admission, will continue to monitor.    -avoid nephrotoxic medications??  ??  Impending Electrolyte Abnormality Secondary to Chemotherapy and/or IV Fluids  -Daily Electrolyte monitoring  -Replete per [facility] guidelines.??  ??  Impending Pancytopenia secondary to chemotherapy:??  - Transfuse 1 unit of pRBCs for hgb >7  - Transfuse 1 unit plt for plts >10K??    Daily Checklist:  Diet: Regular Diet  DVT PPx: Lovenox 40mg  q24h  Electrolytes: No Repletion Needed  Code Status: Full Code  Dispo: Goal Discharge: after chemo completion    Team Contact Information:   Primary Team: Oncology (MEDO)  Primary Resident: George Hugh, MD  Resident's Pager: [phone] (Oncology Intern - Cliffton Asters)    Interval History:   No acute events overnight. Continues to do well. No n/v. Some hiccups with relief with baclofen.     ROS: Denies headache, chest pain, shortness of breath, abdominal pain, nausea, vomiting.    Objective:   Temp:  [36.2 ??C (97.2 ??F)-36.8 ??C (98.2 ??F)] 36.2 ??C (97.2 ??F)  Heart Rate:  [71-87] 83  Resp:  [18-20] 18  BP: (121-134)/(66-75) 134/69  SpO2:  [95 %-98 %] 98 %    Gen: Sitting up in NAD, answers questions appropriately  Eyes: sclera anicteric, EOMI  HENT: atraumatic, MMM,   Heart: RRR, S1, S2, no M/R/G, no chest wall tenderness  Lungs: CTAB, no crackles or wheezes, no use of accessory muscles  Abdomen: soft, NTND, no rebound/guarding  Extremities: no clubbing, cyanosis, or edema in the BLEs  Psych: Alert, oriented, appropriate mood and affect    Labs/Studies: Labs and Studies from the last 24hrs per EMR and Reviewed

## 2021-04-27 ENCOUNTER — Institutional Professional Consult (permissible substitution): Admit: 2021-04-27 | Discharge: 2021-04-28 | Payer: MEDICARE

## 2021-04-27 DIAGNOSIS — C499 Malignant neoplasm of connective and soft tissue, unspecified: Principal | ICD-10-CM

## 2021-04-27 DIAGNOSIS — Z7689 Persons encountering health services in other specified circumstances: Secondary | ICD-10-CM | POA: Diagnosis not present

## 2021-04-27 MED ORDER — PEGFILGRASTIM-BMEZ 6 MG/0.6 ML SUBCUTANEOUS SYRINGE
3 refills | 0 days | Status: CP
Start: 2021-04-27 — End: ?

## 2021-04-28 NOTE — Progress Notes (Signed)
04/29/21 3:11 PM   Larry Richardson 05-14-1956 [id number]  Referring provider:  Leone Haven, MD [address] [place],  [place] [address] Chief Complaint  Patient presents with   Follow-up     HPI: Larry Richardson is a 65 y.o.male with a personal history of sarcoma and stage 3 a CKD, who presents today for follow-up with CT results.   He is s/p right radical nephrectomy for a renal mass > 12 cm. Surgical pathology had been pending for 4 weeks, second opinion sent to [facility] for identification.  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  Surgical margins were negative.  03/17/2021 CT scan was reviewed, NED.  He was referred to sarcoma clinic at [facility] and they recommended treated with doxorubicin an ifosfamide, he has been getting infusions of this. His most recent was on 04/22/2021 with 3-4 day admission    He reports today that he has one more round of chemo (has elected to complete 4 cycles of adjuvant therapy).   PM Past Medical History:  Diagnosis Date   Cancer ([place]) 12/2020   renal cell cancer   Chickenpox    Colon polyps    Diverticulosis    Hemorrhoids    Had blood in his stool 12-13 years ago, had colonoscopy and was found to have hemorrhoids   Hyperlipidemia     Surgical History: Past Surgical History:  Procedure Laterality Date   ACHILLES TENDON REPAIR Right 1980   COLONOSCOPY     LAPAROSCOPIC NEPHRECTOMY, HAND ASSISTED Right 01/02/2021   Procedure: HAND ASSISTED LAPAROSCOPIC NEPHRECTOMY;  Surgeon: Hollice Espy, MD;  Location: [facility] ORS;  Service: Urology;  Laterality: Right;    Home Medications:  Allergies as of 04/29/2021   No Known Allergies      Medication List        Accurate as of April 29, 2021  3:11 PM. If you have any questions, ask your nurse or doctor.          docusate sodium 100 MG capsule Commonly  known as: COLACE Take 1 capsule (100 mg total) by mouth 2 (two) times daily.   OLANZapine 5 MG tablet Commonly known as: ZYPREXA Take 5 mg by mouth at bedtime.   ondansetron 8 MG tablet Commonly known as: ZOFRAN Take 8 mg by mouth every 8 (eight) hours as needed.   prochlorperazine 10 MG tablet Commonly known as: COMPAZINE Take 10 mg by mouth every 6 (six) hours as needed.   rosuvastatin 20 MG tablet Commonly known as: CRESTOR TAKE 1 TABLET BY MOUTH EVERY DAY   Ziextenzo 6 MG/0.6ML injection Generic drug: pegfilgrastim-bmez Inject into the skin.        Allergies: No Known Allergies  Family History: Family History  Problem Relation Age of Onset   Breast cancer Unknown        Mom   Prostate cancer Unknown        Grandfather   Hyperlipidemia Unknown        Parent   Hypertension Unknown        Parent   Leukemia Unknown        Father    Social History:  reports that he has never smoked. He has never been exposed to tobacco smoke. He has never used smokeless tobacco. He reports current alcohol use of about 2.0 standard drinks per week. He reports that he does not use drugs.   Physical Exam: BP  111/69   Pulse 99   Ht 6\' 2"  (1.88 m)   Wt 198 lb 12.8 oz (90.2 kg)   BMI 25.52 kg/m   Constitutional:  Alert and oriented, No acute distress.  Accompanied by wife today.  Appears slightly more frail than previous visits. HEENT: [place] AT, moist mucus membranes.  Trachea midline, no masses. Cardiovascular: No clubbing, cyanosis, or edema. Respiratory: Normal respiratory effort, no increased work of breathing. Skin: No rashes, bruises or suspicious lesions. Neurologic: Grossly intact, no focal deficits, moving all 4 extremities. Psychiatric: Normal mood and affect.  Laboratory Data:  Lab Results  Component Value Date   CREATININE 1.49 (H) 01/15/2021     Lab Results  Component Value Date   HGBA1C 5.5 06/30/2020     Pertinent Imaging: CT reviewed in care link from  [facility], previous radiologic interpretation.   Assessment & Plan:    Right liposarcoma  - Now undercare of [facility] sarcoma clinic received adjuvant therapy - Underwent chemotherapy; treated with doxorubicin an ifosfamide. He is on his fourth cycle.  - CT abdomen and pelvis w/ contrast reviewed, NED -We discussed at this point, may continue to have a surveillance at [facility] but transition back if deemed appropriate for alternate between providers which can be coordinated if desired    2. Stage 3a chronic kidney disease ([place]) - Continue solitary kidney precautions including avoidance of NSAIDs and dehydration    I,Kailey Littlejohn,acting as a scribe for Hollice Espy, MD.,have documented all relevant documentation on the behalf of Hollice Espy, MD,as directed by  Hollice Espy, MD while in the presence of Hollice Espy, MD.  I have reviewed the above documentation for accuracy and completeness, and I agree with the above.   Hollice Espy, MD  I spent 30 total minutes on the day of the encounter including pre-visit review of the medical record, face-to-face time with the patient, and post visit ordering of labs/imaging/tests.  Records and imaging from [facility] reviewed extensively.  [facility] [address], [place], [place] [address] [phone]

## 2021-04-29 ENCOUNTER — Other Ambulatory Visit: Payer: Self-pay

## 2021-04-29 ENCOUNTER — Ambulatory Visit: Payer: Medicare HMO | Admitting: Urology

## 2021-04-29 VITALS — BP 111/69 | HR 99 | Ht 74.0 in | Wt 198.8 lb

## 2021-04-29 DIAGNOSIS — C499 Malignant neoplasm of connective and soft tissue, unspecified: Secondary | ICD-10-CM

## 2021-04-29 DIAGNOSIS — N2889 Other specified disorders of kidney and ureter: Secondary | ICD-10-CM | POA: Diagnosis not present

## 2021-05-01 ENCOUNTER — Ambulatory Visit (INDEPENDENT_AMBULATORY_CARE_PROVIDER_SITE_OTHER): Payer: Medicare HMO

## 2021-05-01 ENCOUNTER — Ambulatory Visit
Admission: RE | Admit: 2021-05-01 | Discharge: 2021-05-01 | Disposition: A | Discharge status: Home / Self Care | Payer: Medicare HMO | Source: Ambulatory Visit | Attending: Emergency Medicine | Admitting: Emergency Medicine

## 2021-05-01 ENCOUNTER — Other Ambulatory Visit: Payer: Self-pay

## 2021-05-01 VITALS — BP 131/79 | HR 116 | Temp 97.9°F | Resp 18

## 2021-05-01 DIAGNOSIS — R0602 Shortness of breath: Secondary | ICD-10-CM

## 2021-05-01 DIAGNOSIS — R000 Tachycardia, unspecified: Secondary | ICD-10-CM | POA: Diagnosis not present

## 2021-05-01 DIAGNOSIS — R059 Cough, unspecified: Secondary | ICD-10-CM | POA: Diagnosis not present

## 2021-05-01 DIAGNOSIS — J209 Acute bronchitis, unspecified: Secondary | ICD-10-CM

## 2021-05-01 MED ORDER — AZITHROMYCIN 250 MG PO TABS: 250.0000 mg | ORAL_TABLET | Freq: Every day | ORAL | 0 refills | Status: DC

## 2021-05-01 NOTE — Discharge Instructions (Addendum)
Take the Zithromax as directed.    Schedule an appointment with your primary care provider or oncologist to discuss your symptoms and your elevated heart rate.     Go to the emergency department right away if you have worsening symptoms or new concerning symptoms.

## 2021-05-01 NOTE — ED Provider Notes (Signed)
[name]    CSN: [id number] Arrival date & time: 05/01/21  3557      History   Chief Complaint Chief Complaint  Patient presents with   Cough   Nasal Congestion    HPI Larry Richardson is a 65 y.o. male.  Accompanied by his wife, patient presents with 5-day history of congestion, runny nose, cough.  He also reports mild shortness of breath.  He denies fever, chills, sore throat, chest pain, or other symptoms.  He is receiving chemotherapy for renal cell cancer.  Non-smoker, no history of lung disease.  The history is provided by the patient, the spouse and medical records.   Past Medical History:  Diagnosis Date   Cancer ([place]) 12/2020   renal cell cancer   Chickenpox    Colon polyps    Diverticulosis    Hemorrhoids    Had blood in his stool 12-13 years ago, had colonoscopy and was found to have hemorrhoids   Hyperlipidemia     Patient Active Problem List   Diagnosis Date Noted   Right renal mass 01/02/2021   Vomiting 11/10/2020   Right buttock pain 06/30/2020   Onychomycosis 06/30/2020   Encounter for general adult medical examination with abnormal findings 05/29/2015   Elevated LDL cholesterol level 07/17/2007   COLONIC POLYPS, HX OF 07/17/2007    Past Surgical History:  Procedure Laterality Date   ACHILLES TENDON REPAIR Right 1980   COLONOSCOPY     LAPAROSCOPIC NEPHRECTOMY, HAND ASSISTED Right 01/02/2021   Procedure: HAND ASSISTED LAPAROSCOPIC NEPHRECTOMY;  Surgeon: Hollice Espy, MD;  Location: [facility] ORS;  Service: Urology;  Laterality: Right;       Home Medications    Prior to Admission medications   Medication Sig Start Date End Date Taking? Authorizing Provider  azithromycin (ZITHROMAX) 250 MG tablet Take 1 tablet (250 mg total) by mouth daily. Take first 2 tablets together, then 1 every day until finished. 05/01/21  Yes Sharion Balloon, NP  rosuvastatin (CRESTOR) 20 MG tablet TAKE 1 TABLET BY MOUTH EVERY DAY Patient taking  differently: Take 20 mg by mouth daily. 12/22/20  Yes Leone Haven, MD  docusate sodium (COLACE) 100 MG capsule Take 1 capsule (100 mg total) by mouth 2 (two) times daily. 01/02/21   Hollice Espy, MD  OLANZapine (ZYPREXA) 5 MG tablet Take 5 mg by mouth at bedtime. 03/10/21   [provider]  ondansetron (ZOFRAN) 8 MG tablet Take 8 mg by mouth every 8 (eight) hours as needed. 03/10/21   [provider]  prochlorperazine (COMPAZINE) 10 MG tablet Take 10 mg by mouth every 6 (six) hours as needed. 03/10/21   [provider]  ZIEXTENZO 6 MG/0.6ML injection Inject into the skin. 03/30/21   [provider]    Family History Family History  Problem Relation Age of Onset   Breast cancer Unknown        Mom   Prostate cancer Unknown        Grandfather   Hyperlipidemia Unknown        Parent   Hypertension Unknown        Parent   Leukemia Unknown        Father    Social History Social History   Tobacco Use   Smoking status: Never    Passive exposure: Never   Smokeless tobacco: Never  Vaping Use   Vaping Use: Never used  Substance Use Topics   Alcohol use: Yes    Alcohol/week: 2.0  standard drinks    Types: 2 Standard drinks or equivalent per week   Drug use: No     Allergies   Patient has no known allergies.   Review of Systems Review of Systems  Constitutional:  Negative for chills and fever.  HENT:  Positive for congestion and rhinorrhea. Negative for ear pain and sore throat.   Respiratory:  Positive for cough and shortness of breath.   Cardiovascular:  Negative for chest pain and palpitations.  Gastrointestinal:  Negative for abdominal pain and vomiting.  Skin:  Negative for color change and rash.  All other systems reviewed and are negative.   Physical Exam Triage Vital Signs ED Triage Vitals  Enc Vitals Group     BP      Pulse      Resp      Temp      Temp src      SpO2      Weight      Height      Head Circumference       Peak Flow      Pain Score      Pain Loc      Pain Edu?      Excl. in [place]?    No data found.  Updated Vital Signs BP 131/79 (BP Location: Left Arm)   Pulse (!) 116   Temp 97.9 F (36.6 C) (Oral)   Resp 18   SpO2 98%   Visual Acuity Right Eye Distance:   Left Eye Distance:   Bilateral Distance:    Right Eye Near:   Left Eye Near:    Bilateral Near:     Physical Exam Vitals and nursing note reviewed.  Constitutional:      General: He is not in acute distress.    Appearance: He is well-developed.  HENT:     Head: Normocephalic and atraumatic.     Right Ear: Tympanic membrane normal.     Left Ear: Tympanic membrane normal.     Nose: Nose normal.     Mouth/Throat:     Mouth: Mucous membranes are moist.     Pharynx: Oropharynx is clear.  Eyes:     Conjunctiva/sclera: Conjunctivae normal.  Cardiovascular:     Rate and Rhythm: Regular rhythm. Tachycardia present.     Heart sounds: Normal heart sounds.  Pulmonary:     Effort: Pulmonary effort is normal. No respiratory distress.     Breath sounds: Wheezing and rhonchi present.     Comments: Scattered wheezes and rhonchi. Abdominal:     Palpations: Abdomen is soft.     Tenderness: There is no abdominal tenderness.  Musculoskeletal:     Cervical back: Neck supple.  Skin:    General: Skin is warm and dry.  Neurological:     General: No focal deficit present.     Mental Status: He is alert and oriented to person, place, and time.     Gait: Gait normal.  Psychiatric:        Mood and Affect: Mood normal.        Behavior: Behavior normal.     UC Treatments / Results  Labs (all labs ordered are listed, but only abnormal results are displayed) Labs Reviewed - No data to display  EKG   Radiology DG Chest 2 View  Result Date: 05/01/2021 CLINICAL DATA:  64 year old male with cough congestion and runny nose. Currently undergoing chemotherapy. EXAM: CHEST - 2 VIEW COMPARISON:  Portable chest 01/15/2021 and  earlier. FINDINGS: PA and lateral views. New right chest dual lumen power port. Lung volumes and mediastinal contours remain within normal limits. Visualized tracheal air column is within normal limits. Both lungs appear clear. No pneumothorax or pleural effusion. Pneumoperitoneum seen in July has resolved. Negative visible bowel gas. No acute osseous abnormality identified. IMPRESSION: No acute cardiopulmonary abnormality. Electronically Signed   By: Genevie Ann M.D.   On: 05/01/2021 10:24    Procedures Procedures (including critical care time)  Medications Ordered in UC Medications - No data to display  Initial Impression / Assessment and Plan / UC Course  I have reviewed the triage vital signs and the nursing notes.  Pertinent labs & imaging results that were available during my care of the patient were reviewed by me and considered in my medical decision making (see chart for details).  Shortness of breath, tachycardia, cough, acute bronchitis.  Afebrile.  No respiratory distress.  O2 sat 98% on room air.  EKG shows sinus tachycardia, rate 123, no ST elevation, compared to previous from 01/15/2021.  CXR negative.  Treating with Zithromax.  Instructed patient to call his PCP and his oncologist to discuss his symptoms, including his elevated heart rate and shortness of breath.  Strict ED precautions discussed.  Patient agrees to plan of care.   Final Clinical Impressions(s) / UC Diagnoses   Final diagnoses:  Shortness of breath  Tachycardia  Cough, unspecified type  Acute bronchitis, unspecified organism     Discharge Instructions      Take the Zithromax as directed.    Schedule an appointment with your primary care provider or oncologist to discuss your symptoms and your elevated heart rate.     Go to the emergency department right away if you have worsening symptoms or new concerning symptoms.         ED Prescriptions     Medication Sig Dispense Auth. Provider   azithromycin  (ZITHROMAX) 250 MG tablet Take 1 tablet (250 mg total) by mouth daily. Take first 2 tablets together, then 1 every day until finished. 6 tablet Sharion Balloon, NP      PDMP not reviewed this encounter.   Sharion Balloon, NP 05/01/21 1041

## 2021-05-01 NOTE — ED Triage Notes (Signed)
Pt c/o cough, chest congestion, and runny nose x 5 days. Pt is currently receiving Chemo at [facility].

## 2021-05-06 ENCOUNTER — Encounter: Payer: Self-pay | Admitting: Family

## 2021-05-06 ENCOUNTER — Other Ambulatory Visit: Payer: Self-pay

## 2021-05-06 ENCOUNTER — Ambulatory Visit (INDEPENDENT_AMBULATORY_CARE_PROVIDER_SITE_OTHER): Payer: Medicare HMO | Admitting: Family

## 2021-05-06 VITALS — BP 120/64 | HR 98 | Temp 98.0°F | Ht 74.0 in | Wt 200.0 lb

## 2021-05-06 DIAGNOSIS — R0981 Nasal congestion: Secondary | ICD-10-CM | POA: Insufficient documentation

## 2021-05-06 MED ORDER — DOXYCYCLINE HYCLATE 100 MG PO TABS: 100.0000 mg | ORAL_TABLET | Freq: Two times a day (BID) | ORAL | 0 refills | Status: AC

## 2021-05-06 NOTE — Patient Instructions (Addendum)
As discussed, you may stop Mucinex for now as I think Zyrtec or Claritin may be more helpful for clear runny discharge.  Plenty of water.  I sent an antibiotic doxycycline for you to start if symptoms fail to improve over the next 1 to 2 days.  If you start this antibiotic, please do start probiotics .  if any further concerns or questions do not hesitate to let me know

## 2021-05-06 NOTE — Progress Notes (Signed)
Subjective:    Patient ID: Larry Richardson, male    DOB: 12-30-1955, 64 y.o.   MRN: [id number]  CC: Larry Richardson is a 65 y.o. male who presents today for follow up.   HPI: Accompanied by wife   Cough x 8 days, unchanged.   Completed zpak yesterday.   Occasional dry cough.  He coughs to clear congestion. Feels congestion but it is not coming up. When he sees it congestion, clear, thin. Cough worse when supine.  He is concerned as he has chemotherapy next Wednesday and wants to feel better by then. No fever, sob, ar pain, CP, sore throat.   He is compliant with mucinex plain bid, flonase without relief.   S/p right nephrectomy. 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 Liam Graham at [facility], last visit 03/31/2021 Crt 1.54 04/25/21  RBC 2.79  Presented to urgent care 05/01/2021  Chest x-ray  05/01/21 shows no acute cardiopulmonary abnormality HISTORY:  Past Medical History:  Diagnosis Date   Cancer ([place]) 12/2020   renal cell cancer   Chickenpox    Colon polyps    Diverticulosis    Hemorrhoids    Had blood in his stool 12-13 years ago, had colonoscopy and was found to have hemorrhoids   Hyperlipidemia    Past Surgical History:  Procedure Laterality Date   ACHILLES TENDON REPAIR Right 1980   COLONOSCOPY     LAPAROSCOPIC NEPHRECTOMY, HAND ASSISTED Right 01/02/2021   Procedure: HAND ASSISTED LAPAROSCOPIC NEPHRECTOMY;  Surgeon: Hollice Espy, MD;  Location: [facility] ORS;  Service: Urology;  Laterality: Right;   Family History  Problem Relation Age of Onset   Breast cancer Unknown        Mom   Prostate cancer Unknown        Grandfather   Hyperlipidemia Unknown        Parent   Hypertension Unknown        Parent   Leukemia Unknown        Father    Allergies: Patient has no known allergies. Current Outpatient Medications on File Prior to Visit  Medication Sig Dispense Refill    docusate sodium (COLACE) 100 MG capsule Take 1 capsule (100 mg total) by mouth 2 (two) times daily. 60 capsule 0   rosuvastatin (CRESTOR) 20 MG tablet TAKE 1 TABLET BY MOUTH EVERY DAY (Patient taking differently: Take 20 mg by mouth daily.) 90 tablet 1   OLANZapine (ZYPREXA) 5 MG tablet Take 5 mg by mouth at bedtime. (Patient not taking: Reported on 05/06/2021)     ondansetron (ZOFRAN) 8 MG tablet Take 8 mg by mouth every 8 (eight) hours as needed. (Patient not taking: Reported on 05/06/2021)     prochlorperazine (COMPAZINE) 10 MG tablet Take 10 mg by mouth every 6 (six) hours as needed. (Patient not taking: Reported on 05/06/2021)     ZIEXTENZO 6 MG/0.6ML injection Inject into the skin. (Patient not taking: Reported on 05/06/2021)     No current facility-administered medications on file prior to visit.    Social History   Tobacco Use   Smoking status: Never    Passive exposure: Never   Smokeless tobacco: Never  Vaping Use   Vaping Use: Never used  Substance Use Topics   Alcohol use: Yes    Alcohol/week: 2.0 standard drinks    Types: 2 Standard drinks or equivalent per week   Drug use: No    Review  of Systems  Constitutional:  Negative for chills and fever.  HENT:  Positive for congestion and postnasal drip.   Respiratory:  Positive for cough. Negative for shortness of breath and wheezing.   Cardiovascular:  Negative for chest pain and palpitations.  Gastrointestinal:  Negative for nausea and vomiting.     Objective:    BP 120/64 (BP Location: Right Arm, Patient Position: Sitting, Cuff Size: Large)   Pulse 98   Temp 98 F (36.7 C) (Oral)   Ht 6\' 2"  (1.88 m)   Wt 200 lb (90.7 kg)   SpO2 98%   BMI 25.68 kg/m  BP Readings from Last 3 Encounters:  05/06/21 120/64  05/01/21 131/79  04/29/21 111/69   Wt Readings from Last 3 Encounters:  05/06/21 200 lb (90.7 kg)  04/29/21 198 lb 12.8 oz (90.2 kg)  01/27/21 183 lb (83 kg)    Physical Exam Vitals reviewed.   Constitutional:      Appearance: He is well-developed.  HENT:     Head: Normocephalic and atraumatic.     Right Ear: Hearing, tympanic membrane, ear canal and external ear normal. No decreased hearing noted. No drainage, swelling or tenderness. No middle ear effusion. Tympanic membrane is not injected, erythematous or bulging.     Left Ear: Hearing, tympanic membrane, ear canal and external ear normal. No decreased hearing noted. No drainage, swelling or tenderness.  No middle ear effusion. Tympanic membrane is not injected, erythematous or bulging.     Nose: Nose normal.     Right Sinus: No maxillary sinus tenderness or frontal sinus tenderness.     Left Sinus: No maxillary sinus tenderness or frontal sinus tenderness.     Mouth/Throat:     Pharynx: Uvula midline. Posterior oropharyngeal erythema present. No oropharyngeal exudate.     Tonsils: No tonsillar abscesses.  Eyes:     Conjunctiva/sclera: Conjunctivae normal.  Cardiovascular:     Rate and Rhythm: Regular rhythm.     Heart sounds: Normal heart sounds.  Pulmonary:     Effort: Pulmonary effort is normal. No respiratory distress.     Breath sounds: Normal breath sounds. No wheezing, rhonchi or rales.  Lymphadenopathy:     Head:     Right side of head: No submental, submandibular, tonsillar, preauricular, posterior auricular or occipital adenopathy.     Left side of head: No submental, submandibular, tonsillar, preauricular, posterior auricular or occipital adenopathy.     Cervical: No cervical adenopathy.  Skin:    General: Skin is warm and dry.  Neurological:     Mental Status: He is alert.  Psychiatric:        Speech: Speech normal.        Behavior: Behavior normal.       Assessment & Plan:   Problem List Items Addressed This Visit       Respiratory   Sinus congestion - Primary    Patient well-appearing.  Not septic in appearance .duration 8 days.  Discussed with patient wife at length today that symptoms most  likely support  viral presentation.  He politely declines testing for RSV, influenza or COVID at this time.  no improvement with azithromycin.  Advised it is reasonable to change from Mucinex to an antihistamine otc.  Ordered baseline CBC, BMP in the setting of his ongoing chemotherapy, h/o CKD.  Advised if symptoms fail to improve over the next 1 to 2 days to go ahead and start doxycycline along with probiotics.  Counseled on avoidance of sun  exposure.  Patient and wife will let me know how he is doing.       Relevant Medications   doxycycline (VIBRA-TABS) 100 MG tablet   Other Relevant Orders   Basic metabolic panel   CBC with Differential/Platelet     I have discontinued Chrissie Noa L. Hedman's azithromycin. I am also having him start on doxycycline. Additionally, I am having him maintain his rosuvastatin, docusate sodium, OLANZapine, ondansetron, Ziextenzo, and prochlorperazine.   Meds ordered this encounter  Medications   doxycycline (VIBRA-TABS) 100 MG tablet    Sig: Take 1 tablet (100 mg total) by mouth 2 (two) times daily for 7 days.    Dispense:  14 tablet    Refill:  0    Order Specific Question:   Supervising Provider    Answer:   Crecencio Mc [[id number]]     Return precautions given.   Risks, benefits, and alternatives of the medications and treatment plan prescribed today were discussed, and patient expressed understanding.   Education regarding symptom management and diagnosis given to patient on AVS.  Continue to follow with Leone Haven, MD for routine health maintenance.   Altha Harm and I agreed with plan.   Mable Paris, FNP

## 2021-05-06 NOTE — Assessment & Plan Note (Signed)
Patient well-appearing.  Not septic in appearance .duration 8 days.  Discussed with patient wife at length today that symptoms most likely support  viral presentation.  He politely declines testing for RSV, influenza or COVID at this time.  no improvement with azithromycin.  Advised it is reasonable to change from Mucinex to an antihistamine otc.  Ordered baseline CBC, BMP in the setting of his ongoing chemotherapy, h/o CKD.  Advised if symptoms fail to improve over the next 1 to 2 days to go ahead and start doxycycline along with probiotics.  Counseled on avoidance of sun exposure.  Patient and wife will let me know how he is doing.

## 2021-05-07 ENCOUNTER — Ambulatory Visit: Payer: Medicare HMO

## 2021-05-07 DIAGNOSIS — R0981 Nasal congestion: Secondary | ICD-10-CM | POA: Diagnosis not present

## 2021-05-07 DIAGNOSIS — D649 Anemia, unspecified: Secondary | ICD-10-CM | POA: Diagnosis not present

## 2021-05-07 LAB — BASIC METABOLIC PANEL
BUN: 19 mg/dL (ref 6–23)
CO2: 27 mEq/L (ref 19–32)
Calcium: 8.6 mg/dL (ref 8.4–10.5)
Chloride: 108 mEq/L (ref 96–112)
Creatinine, Ser: 1.72 mg/dL — ABNORMAL HIGH (ref 0.40–1.50)
GFR: 40.97 mL/min — ABNORMAL LOW (ref >60.00)
Glucose, Bld: 124 mg/dL — ABNORMAL HIGH (ref 70–99)
Potassium: 4.6 mEq/L (ref 3.5–5.1)
Sodium: 141 mEq/L (ref 135–145)

## 2021-05-08 ENCOUNTER — Encounter: Payer: Self-pay | Admitting: Family

## 2021-05-08 LAB — CBC WITH DIFFERENTIAL/PLATELET
Absolute Monocytes: 1269 cells/uL — ABNORMAL HIGH (ref 200–950)
Basophils Absolute: 63 cells/uL (ref 0–200)
Basophils Relative: 0.7 %
Eosinophils Absolute: 72 cells/uL (ref 15–500)
Eosinophils Relative: 0.8 %
HCT: 26.2 % — ABNORMAL LOW (ref 38.5–50.0)
Hemoglobin: 9 g/dL — ABNORMAL LOW (ref 13.2–17.1)
Lymphs Abs: 972 cells/uL (ref 850–3900)
MCH: 31.5 pg (ref 27.0–33.0)
MCHC: 34.4 g/dL (ref 32.0–36.0)
MCV: 91.6 fL (ref 80.0–100.0)
MPV: 12.3 fL (ref 7.5–12.5)
Monocytes Relative: 14.1 %
Neutro Abs: 6624 cells/uL (ref 1500–7800)
Neutrophils Relative %: 73.6 %
Platelets: 58 10*3/uL — ABNORMAL LOW (ref 140–400)
RBC: 2.86 10*6/uL — ABNORMAL LOW (ref 4.20–5.80)
RDW: 16 % — ABNORMAL HIGH (ref 11.0–15.0)
Total Lymphocyte: 10.8 %
WBC: 9 10*3/uL (ref 3.8–10.8)

## 2021-05-10 NOTE — Telephone Encounter (Signed)
Noted. This is in response to labs that were drawn as part of a visit with Mable Paris.

## 2021-05-12 ENCOUNTER — Other Ambulatory Visit: Admit: 2021-05-12 | Discharge: 2021-05-13 | Payer: MEDICARE

## 2021-05-12 ENCOUNTER — Ambulatory Visit
Admit: 2021-05-12 | Discharge: 2021-05-13 | Payer: MEDICARE | Attending: Student in an Organized Health Care Education/Training Program | Primary: Student in an Organized Health Care Education/Training Program

## 2021-05-12 DIAGNOSIS — C480 Malignant neoplasm of retroperitoneum: Principal | ICD-10-CM

## 2021-05-12 DIAGNOSIS — C499 Malignant neoplasm of connective and soft tissue, unspecified: Principal | ICD-10-CM

## 2021-05-13 ENCOUNTER — Ambulatory Visit: Admit: 2021-05-13 | Discharge: 2021-05-16 | Payer: MEDICARE

## 2021-05-13 ENCOUNTER — Encounter
Admit: 2021-05-13 | Discharge: 2021-05-16 | Payer: MEDICARE | Attending: Student in an Organized Health Care Education/Training Program

## 2021-05-13 ENCOUNTER — Ambulatory Visit
Admit: 2021-05-13 | Discharge: 2021-05-16 | Disposition: A | Discharge status: Home / Self Care | Payer: MEDICARE | Admitting: Internal Medicine

## 2021-05-13 DIAGNOSIS — Z20822 Contact with and (suspected) exposure to covid-19: Secondary | ICD-10-CM | POA: Diagnosis not present

## 2021-05-13 DIAGNOSIS — Z803 Family history of malignant neoplasm of breast: Secondary | ICD-10-CM | POA: Diagnosis not present

## 2021-05-13 DIAGNOSIS — Z905 Acquired absence of kidney: Secondary | ICD-10-CM | POA: Diagnosis not present

## 2021-05-13 DIAGNOSIS — N179 Acute kidney failure, unspecified: Secondary | ICD-10-CM | POA: Diagnosis not present

## 2021-05-13 DIAGNOSIS — Z5111 Encounter for antineoplastic chemotherapy: Secondary | ICD-10-CM | POA: Diagnosis not present

## 2021-05-13 DIAGNOSIS — E785 Hyperlipidemia, unspecified: Secondary | ICD-10-CM | POA: Diagnosis not present

## 2021-05-13 DIAGNOSIS — C499 Malignant neoplasm of connective and soft tissue, unspecified: Secondary | ICD-10-CM | POA: Diagnosis not present

## 2021-05-13 DIAGNOSIS — E878 Other disorders of electrolyte and fluid balance, not elsewhere classified: Secondary | ICD-10-CM | POA: Diagnosis not present

## 2021-05-13 DIAGNOSIS — C641 Malignant neoplasm of right kidney, except renal pelvis: Secondary | ICD-10-CM | POA: Diagnosis not present

## 2021-05-13 DIAGNOSIS — C480 Malignant neoplasm of retroperitoneum: Secondary | ICD-10-CM | POA: Diagnosis not present

## 2021-05-16 DIAGNOSIS — N179 Acute kidney failure, unspecified: Secondary | ICD-10-CM | POA: Diagnosis not present

## 2021-05-16 DIAGNOSIS — E785 Hyperlipidemia, unspecified: Secondary | ICD-10-CM | POA: Diagnosis not present

## 2021-05-16 DIAGNOSIS — Z5111 Encounter for antineoplastic chemotherapy: Secondary | ICD-10-CM | POA: Diagnosis not present

## 2021-05-16 DIAGNOSIS — C480 Malignant neoplasm of retroperitoneum: Secondary | ICD-10-CM | POA: Diagnosis not present

## 2021-05-19 DIAGNOSIS — C499 Malignant neoplasm of connective and soft tissue, unspecified: Principal | ICD-10-CM

## 2021-05-21 ENCOUNTER — Other Ambulatory Visit: Payer: Medicare HMO

## 2021-05-21 ENCOUNTER — Other Ambulatory Visit: Payer: Self-pay | Admitting: *Deleted

## 2021-05-21 DIAGNOSIS — C499 Malignant neoplasm of connective and soft tissue, unspecified: Secondary | ICD-10-CM

## 2021-06-11 ENCOUNTER — Other Ambulatory Visit: Payer: Self-pay

## 2021-06-11 ENCOUNTER — Telehealth: Payer: Self-pay

## 2021-06-11 DIAGNOSIS — C499 Malignant neoplasm of connective and soft tissue, unspecified: Secondary | ICD-10-CM

## 2021-06-11 NOTE — Telephone Encounter (Signed)
That is fine. Did they provide a diagnosis that they wanted those orders placed under? If so you can order them and cosign them to me.

## 2021-06-11 NOTE — Telephone Encounter (Signed)
Yes they gave me the Dx codes and it ordered them and the patient is scheduled for labs tomorrow.  Daryn Pisani,cma

## 2021-06-11 NOTE — Telephone Encounter (Signed)
Thanks

## 2021-06-12 ENCOUNTER — Other Ambulatory Visit (INDEPENDENT_AMBULATORY_CARE_PROVIDER_SITE_OTHER): Payer: Medicare HMO

## 2021-06-12 ENCOUNTER — Other Ambulatory Visit: Payer: Self-pay

## 2021-06-12 DIAGNOSIS — C499 Malignant neoplasm of connective and soft tissue, unspecified: Secondary | ICD-10-CM

## 2021-06-12 LAB — CBC WITH DIFFERENTIAL/PLATELET
Basophils Absolute: 0.1 10*3/uL (ref 0.0–0.1)
Basophils Relative: 1.1 % (ref 0.0–3.0)
Eosinophils Absolute: 0.3 10*3/uL (ref 0.0–0.7)
Eosinophils Relative: 4.8 % (ref 0.0–5.0)
HCT: 29.7 % — ABNORMAL LOW (ref 39.0–52.0)
Hemoglobin: 10.2 g/dL — ABNORMAL LOW (ref 13.0–17.0)
Lymphocytes Relative: 16.9 % (ref 12.0–46.0)
Lymphs Abs: 0.9 10*3/uL (ref 0.7–4.0)
MCHC: 34.2 g/dL (ref 30.0–36.0)
MCV: 98.2 fl (ref 78.0–100.0)
Monocytes Absolute: 0.8 10*3/uL (ref 0.1–1.0)
Monocytes Relative: 15.3 % — ABNORMAL HIGH (ref 3.0–12.0)
Neutro Abs: 3.4 10*3/uL (ref 1.4–7.7)
Neutrophils Relative %: 61.9 % (ref 43.0–77.0)
Platelets: 166 10*3/uL (ref 150.0–400.0)
RBC: 3.03 Mil/uL — ABNORMAL LOW (ref 4.22–5.81)
RDW: 20.7 % — ABNORMAL HIGH (ref 11.5–15.5)
WBC: 5.5 10*3/uL (ref 4.0–10.5)

## 2021-06-12 LAB — COMPREHENSIVE METABOLIC PANEL
ALT: 18 U/L (ref 0–53)
AST: 19 U/L (ref 0–37)
Albumin: 4.3 g/dL (ref 3.5–5.2)
Alkaline Phosphatase: 47 U/L (ref 39–117)
BUN: 21 mg/dL (ref 6–23)
CO2: 27 mEq/L (ref 19–32)
Calcium: 9.1 mg/dL (ref 8.4–10.5)
Chloride: 107 mEq/L (ref 96–112)
Creatinine, Ser: 1.76 mg/dL — ABNORMAL HIGH (ref 0.40–1.50)
GFR: 39.83 mL/min — ABNORMAL LOW (ref >60.00)
Glucose, Bld: 92 mg/dL (ref 70–99)
Potassium: 4.8 mEq/L (ref 3.5–5.1)
Sodium: 140 mEq/L (ref 135–145)
Total Bilirubin: 0.6 mg/dL (ref 0.2–1.2)
Total Protein: 6.3 g/dL (ref 6.0–8.3)

## 2021-06-16 ENCOUNTER — Ambulatory Visit: Admit: 2021-06-16 | Discharge: 2021-06-17 | Payer: MEDICARE

## 2021-06-16 ENCOUNTER — Ambulatory Visit
Admit: 2021-06-16 | Discharge: 2021-06-17 | Payer: MEDICARE | Attending: Student in an Organized Health Care Education/Training Program | Primary: Student in an Organized Health Care Education/Training Program

## 2021-06-16 ENCOUNTER — Institutional Professional Consult (permissible substitution): Admit: 2021-06-16 | Discharge: 2021-06-17 | Payer: MEDICARE

## 2021-06-16 DIAGNOSIS — C499 Malignant neoplasm of connective and soft tissue, unspecified: Principal | ICD-10-CM

## 2021-06-16 DIAGNOSIS — C480 Malignant neoplasm of retroperitoneum: Secondary | ICD-10-CM | POA: Diagnosis not present

## 2021-06-16 DIAGNOSIS — N62 Hypertrophy of breast: Secondary | ICD-10-CM | POA: Diagnosis not present

## 2021-06-16 DIAGNOSIS — R918 Other nonspecific abnormal finding of lung field: Secondary | ICD-10-CM | POA: Diagnosis not present

## 2021-06-23 ENCOUNTER — Ambulatory Visit
Admit: 2021-06-23 | Discharge: 2021-06-24 | Payer: MEDICARE | Attending: Student in an Organized Health Care Education/Training Program | Primary: Student in an Organized Health Care Education/Training Program

## 2021-06-23 DIAGNOSIS — C499 Malignant neoplasm of connective and soft tissue, unspecified: Principal | ICD-10-CM

## 2021-06-23 DIAGNOSIS — C480 Malignant neoplasm of retroperitoneum: Principal | ICD-10-CM

## 2021-07-01 ENCOUNTER — Other Ambulatory Visit: Payer: Self-pay

## 2021-07-01 ENCOUNTER — Ambulatory Visit (INDEPENDENT_AMBULATORY_CARE_PROVIDER_SITE_OTHER): Payer: Medicare HMO | Admitting: Family Medicine

## 2021-07-01 ENCOUNTER — Other Ambulatory Visit: Payer: Self-pay | Admitting: Family Medicine

## 2021-07-01 ENCOUNTER — Encounter: Payer: Self-pay | Admitting: Family Medicine

## 2021-07-01 VITALS — BP 110/60 | HR 93 | Temp 98.4°F | Ht 74.0 in | Wt 209.2 lb

## 2021-07-01 DIAGNOSIS — Z125 Encounter for screening for malignant neoplasm of prostate: Secondary | ICD-10-CM

## 2021-07-01 DIAGNOSIS — E663 Overweight: Secondary | ICD-10-CM | POA: Diagnosis not present

## 2021-07-01 DIAGNOSIS — E785 Hyperlipidemia, unspecified: Secondary | ICD-10-CM

## 2021-07-01 DIAGNOSIS — Z0001 Encounter for general adult medical examination with abnormal findings: Secondary | ICD-10-CM

## 2021-07-01 DIAGNOSIS — N62 Hypertrophy of breast: Secondary | ICD-10-CM | POA: Diagnosis not present

## 2021-07-01 LAB — HEPATIC FUNCTION PANEL
ALT: 22 U/L (ref 0–53)
AST: 23 U/L (ref 0–37)
Albumin: 4.5 g/dL (ref 3.5–5.2)
Alkaline Phosphatase: 54 U/L (ref 39–117)
Bilirubin, Direct: 0.2 mg/dL (ref 0.0–0.3)
Total Bilirubin: 0.8 mg/dL (ref 0.2–1.2)
Total Protein: 6.4 g/dL (ref 6.0–8.3)

## 2021-07-01 LAB — LIPID PANEL
Cholesterol: 110 mg/dL (ref 0–200)
HDL: 30.8 mg/dL — ABNORMAL LOW (ref >39.00)
LDL Cholesterol: 61 mg/dL (ref 0–99)
NonHDL: 78.87
Total CHOL/HDL Ratio: 4
Triglycerides: 90 mg/dL (ref 0.0–149.0)
VLDL: 18 mg/dL (ref 0.0–40.0)

## 2021-07-01 LAB — HEMOGLOBIN A1C: Hgb A1c MFr Bld: 4.2 % — ABNORMAL LOW (ref 4.6–6.5)

## 2021-07-01 LAB — PSA, MEDICARE: PSA: 2.38 ng/ml (ref 0.10–4.00)

## 2021-07-01 NOTE — Assessment & Plan Note (Addendum)
Physical exam completed.  I encouraged healthy diet and exercise which she plans to start in the new year.  Prostate cancer screening completed today with a PSA.  We will request his colonoscopy records.  Given that he just finished chemotherapy we will defer the pneumonia vaccine for 3 months per CDC recommendations.  Lab work as outlined.

## 2021-07-01 NOTE — Assessment & Plan Note (Signed)
Right-sided.  No masses palpated and no tenderness today.  The patient reports this is progressively improving over the last couple of weeks since he has been off of chemotherapy.  He has been evaluated by his oncologist.  I reinforced their advice to monitor for another couple of weeks and if it is not resolved at that time he should let us and the oncologist know.

## 2021-07-01 NOTE — Patient Instructions (Addendum)
Nice to see you. We will get lab work today. You will return in 3 months for your pneumonia vaccine. Please start diet and exercise changes after the new year. If you do not have resolution of the right-sided breast enlargement in the next couple of weeks please let us and your oncologist know.

## 2021-07-01 NOTE — Progress Notes (Signed)
[name], MD Phone: [phone]  Larry Richardson is a 65 y.o. male who presents today for CPE.  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  He continues to follow with oncology at [facility].  He saw them earlier this month and did note some right-sided gynecomastia.  He notes his right breast was a little larger than the left and somewhat tender.  He noted it was improving at that time. The oncologist advised that he should continue to monitor as it had been improving at that time and let them know if it did not resolve.   Active Ambulatory Problems    Diagnosis Date Noted   Elevated LDL cholesterol level 07/17/2007   COLONIC POLYPS, HX OF 07/17/2007   Encounter for general adult medical examination with abnormal findings 05/29/2015   Right buttock pain 06/30/2020   Onychomycosis 06/30/2020   Vomiting 11/10/2020   Right renal mass 01/02/2021   Sinus congestion 05/06/2021   Gynecomastia, male 07/01/2021   Resolved Ambulatory Problems    Diagnosis Date Noted   Sore throat 08/04/2015   Past Medical History:  Diagnosis Date   Cancer ([place]) 12/2020   Chickenpox    Colon polyps    Diverticulosis    Hemorrhoids    Hyperlipidemia     Family History  Problem Relation Age of Onset   Breast cancer Unknown        Mom   Prostate cancer Unknown        Grandfather   Hyperlipidemia Unknown        Parent    Hypertension Unknown        Parent   Leukemia Unknown        Father    Social History   Socioeconomic History   Marital status: Married    Spouse name: Santiago Glad   Number of children: Not on file   Years of education: Not on file   Highest education level: Not on file  Occupational History   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 Feels safe in his home.   His wife goes to [place] during week to take care of kids.   She will be here to help out after surgery.   Social Determinants of Health   Financial Resource Strain: Not on file  Food Insecurity: Not on file  Transportation Needs: Not on file  Physical Activity:  Not on file  Stress: Not on file  Social Connections: Not on file  Intimate Partner Violence: Not on file    ROS  General:  Negative for nexplained weight loss, fever Skin: Negative for new or changing mole, sore that won't heal HEENT: Negative for trouble hearing, trouble seeing, ringing in ears, mouth sores, hoarseness, change in voice, dysphagia. CV:  Negative for chest pain, dyspnea, edema, palpitations Resp: Negative for cough, dyspnea, hemoptysis GI: Negative for nausea, vomiting, diarrhea, constipation, abdominal pain, melena, hematochezia. GU: Negative for dysuria, incontinence, urinary hesitance, hematuria, vaginal or penile discharge, polyuria, sexual difficulty, lumps in testicle or breasts MSK: Negative for muscle cramps or aches, joint pain or swelling Neuro: Negative for headaches, weakness, numbness, dizziness,  passing out/fainting Psych: Negative for depression, anxiety, memory problems  Objective  Physical Exam Vitals:   07/01/21 0830  BP: 110/60  Pulse: 93  Temp: 98.4 F (36.9 C)  SpO2: 99%    BP Readings from Last 3 Encounters:  07/01/21 110/60  05/06/21 120/64  05/01/21 131/79   Wt Readings from Last 3 Encounters:  07/01/21 209 lb 3.2 oz (94.9 kg)  05/06/21 200 lb (90.7 kg)  04/29/21 198 lb 12.8 oz (90.2 kg)    Physical Exam Constitutional:      General: He is not in acute distress.    Appearance: He is not diaphoretic.  HENT:     Head: Normocephalic and atraumatic.  Eyes:     Conjunctiva/sclera: Conjunctivae normal.     Pupils: Pupils are equal, round, and reactive to light.  Cardiovascular:     Rate and Rhythm: Normal rate and regular rhythm.     Heart sounds: Normal heart sounds.  Pulmonary:     Effort: Pulmonary effort is normal.     Breath sounds: Normal breath sounds.  Chest:     Comments: Slight right breast enlargement with no palpable lesions, there is no tenderness today Abdominal:     General: Bowel sounds are normal. There is no distension.     Palpations: Abdomen is soft.     Tenderness: There is no abdominal tenderness.  Musculoskeletal:     Right lower leg: No edema.     Left lower leg: No edema.  Lymphadenopathy:     Cervical: No cervical adenopathy.  Skin:    General: Skin is warm and dry.  Neurological:     Mental Status: He is alert.  Psychiatric:        Mood and Affect: Mood normal.     Assessment/Plan:   Problem List Items Addressed This Visit     Encounter for general adult medical examination with abnormal findings    Physical exam completed.  I encouraged healthy diet and exercise which she plans to start in the new year.  Prostate cancer screening completed today with a PSA.  We will request his colonoscopy records.  Given that he just finished chemotherapy we will defer the pneumonia vaccine for 3 months per CDC  recommendations.  Lab work as outlined.      Gynecomastia, male    Right-sided.  No masses palpated and no tenderness today.  The patient reports this is progressively improving over the last couple of weeks since he has been off of chemotherapy.  He has been evaluated by his oncologist.  I reinforced their advice to monitor for another couple of weeks and if it is not resolved at that time he should let us and the oncologist know.      Other  Visit Diagnoses     Hyperlipidemia, unspecified hyperlipidemia type    -  Primary   Relevant Orders   Hepatic function panel   Lipid panel   Prostate cancer screening       Relevant Orders   PSA, Medicare ( [facility] [place] [facility] only)   Overweight       Relevant Orders   HgB A1c       Return in about 3 months (around 09/29/2021) for Pneumovax 23 with nursing, 6 months PCP.  This visit occurred during the SARS-CoV-2 public health emergency.  Safety protocols were in place, including screening questions prior to the visit, additional usage of staff PPE, and extensive cleaning of exam room while observing appropriate contact time as indicated for disinfecting solutions.    [name], MD [place]

## 2021-08-07 ENCOUNTER — Institutional Professional Consult (permissible substitution): Admit: 2021-08-07 | Discharge: 2021-08-08 | Payer: MEDICARE

## 2021-08-07 ENCOUNTER — Encounter: Payer: Self-pay | Admitting: Family Medicine

## 2021-08-07 ENCOUNTER — Other Ambulatory Visit: Payer: Self-pay

## 2021-08-07 ENCOUNTER — Ambulatory Visit (INDEPENDENT_AMBULATORY_CARE_PROVIDER_SITE_OTHER): Payer: Medicare HMO | Admitting: Family Medicine

## 2021-08-07 DIAGNOSIS — C499 Malignant neoplasm of connective and soft tissue, unspecified: Principal | ICD-10-CM

## 2021-08-07 DIAGNOSIS — Z452 Encounter for adjustment and management of vascular access device: Secondary | ICD-10-CM | POA: Diagnosis not present

## 2021-08-07 DIAGNOSIS — N62 Hypertrophy of breast: Secondary | ICD-10-CM | POA: Diagnosis not present

## 2021-08-07 NOTE — Patient Instructions (Signed)
Nice to see you. We will get a mammogram and ultrasound to evaluate this further.  Please let me know if you do not hear from our office in the next week to get this scheduled.

## 2021-08-07 NOTE — Assessment & Plan Note (Signed)
I suspect gynecomastia though given the persistence of this issue we will obtain a mammogram and ultrasound to evaluate further.

## 2021-08-07 NOTE — Progress Notes (Signed)
°  [name], MD Phone: [phone]  BENECIO KLUGER is a 66 y.o. male who presents today for f/u.  Right breast enlargement: Patient notes this has maybe improved slightly since her last visit though it is difficult for him to tell.  The soreness is definitely improved.  He feels the thickness in the subcutaneous tissue above his right nipple.  He notes this started after he finished olanzapine as part of his chemotherapy regimen.  At his last visit this had been improving and he had previously discussed it with his oncologist who advised to monitor given that this was improving and if it did not resolve further work-up would be completed.  He notes no nipple discharge or nipple bleeding.  Social History   Tobacco Use  Smoking Status Never   Passive exposure: Never  Smokeless Tobacco Never    Current Outpatient Medications on File Prior to Visit  Medication Sig Dispense Refill   docusate sodium (COLACE) 100 MG capsule Take 1 capsule (100 mg total) by mouth 2 (two) times daily. 60 capsule 0   rosuvastatin (CRESTOR) 20 MG tablet TAKE 1 TABLET BY MOUTH EVERY DAY 90 tablet 1   No current facility-administered medications on file prior to visit.     ROS see history of present illness  Objective  Physical Exam Vitals:   08/07/21 0858  BP: 110/80  Pulse: 80  Temp: 98.1 F (36.7 C)  SpO2: 96%    BP Readings from Last 3 Encounters:  08/07/21 110/80  07/01/21 110/60  05/06/21 120/64   Wt Readings from Last 3 Encounters:  08/07/21 208 lb 3.2 oz (94.4 kg)  07/01/21 209 lb 3.2 oz (94.9 kg)  05/06/21 200 lb (90.7 kg)    Physical Exam Chest:       Comments: Normal left chest exam    Assessment/Plan: Please see individual problem list.  Problem List Items Addressed This Visit     Gynecomastia, male    I suspect gynecomastia though given the persistence of this issue we will obtain a mammogram and ultrasound to evaluate further.      Relevant Orders   MM  DIAG BREAST TOMO BILATERAL   US BREAST LTD UNI RIGHT INC AXILLA   US BREAST LTD UNI LEFT INC AXILLA   Return in about 6 weeks (around 09/18/2021) for Recheck right breast.  This visit occurred during the SARS-CoV-2 public health emergency.  Safety protocols were in place, including screening questions prior to the visit, additional usage of staff PPE, and extensive cleaning of exam room while observing appropriate contact time as indicated for disinfecting solutions.    [name], MD [place]

## 2021-08-10 ENCOUNTER — Telehealth: Payer: Self-pay | Admitting: Family Medicine

## 2021-08-10 NOTE — Telephone Encounter (Signed)
Pt wife called in stating that Dr. Caryl Bis sent a referral over to [facility] for Pt to get a Ultrasound/mammogram. Pt wife stated that Pt health insurance advise her that a PA is needed for th referral. Pt wife requesting callback.

## 2021-08-10 NOTE — Telephone Encounter (Signed)
This message was sent to Rasheedah Y to follow up.  Marisol Glazer,cma

## 2021-08-19 ENCOUNTER — Ambulatory Visit
Admission: RE | Admit: 2021-08-19 | Discharge: 2021-08-19 | Disposition: A | Discharge status: Home / Self Care | Payer: Medicare HMO | Source: Ambulatory Visit | Attending: Family Medicine | Admitting: Family Medicine

## 2021-08-19 ENCOUNTER — Other Ambulatory Visit: Payer: Self-pay

## 2021-08-19 DIAGNOSIS — N644 Mastodynia: Secondary | ICD-10-CM | POA: Diagnosis not present

## 2021-08-19 DIAGNOSIS — N62 Hypertrophy of breast: Secondary | ICD-10-CM | POA: Diagnosis not present

## 2021-08-19 DIAGNOSIS — R922 Inconclusive mammogram: Secondary | ICD-10-CM | POA: Diagnosis not present

## 2021-08-21 ENCOUNTER — Other Ambulatory Visit: Payer: Self-pay | Admitting: Family Medicine

## 2021-08-21 DIAGNOSIS — N62 Hypertrophy of breast: Secondary | ICD-10-CM

## 2021-08-24 DIAGNOSIS — H35372 Puckering of macula, left eye: Secondary | ICD-10-CM | POA: Diagnosis not present

## 2021-09-18 ENCOUNTER — Other Ambulatory Visit: Payer: Self-pay

## 2021-09-18 ENCOUNTER — Encounter: Payer: Self-pay | Admitting: Family Medicine

## 2021-09-18 ENCOUNTER — Ambulatory Visit (INDEPENDENT_AMBULATORY_CARE_PROVIDER_SITE_OTHER): Payer: Medicare HMO | Admitting: Family Medicine

## 2021-09-18 VITALS — BP 130/70 | HR 84 | Temp 98.3°F | Ht 74.0 in | Wt 210.8 lb

## 2021-09-18 DIAGNOSIS — N62 Hypertrophy of breast: Secondary | ICD-10-CM | POA: Diagnosis not present

## 2021-09-18 DIAGNOSIS — Z23 Encounter for immunization: Secondary | ICD-10-CM | POA: Diagnosis not present

## 2021-09-18 NOTE — Patient Instructions (Signed)
Nice to see you. ?We will get labs as we discussed. ?

## 2021-09-18 NOTE — Assessment & Plan Note (Signed)
There is still a difference in his exam between the right and left sides.  Discussed he likely still has some gynecomastia.  Discussed obtaining lab work.  He has already been scheduled for this at an appropriate time of day.  We will see what his labs reveal and then determine the next step in management. ?

## 2021-09-18 NOTE — Progress Notes (Signed)
?  [name], MD ?Phone: [phone] ? ?Larry Richardson is a 66 y.o. male who presents today for f/u. ? ?Gynecomastia: Patient feels as though this is resolved.  He notes there is no pain.  No discharge.  He had a mammogram and ultrasound revealing gynecomastia. ? ?Social History  ? ?Tobacco Use  ?Smoking Status Never  ? Passive exposure: Never  ?Smokeless Tobacco Never  ? ? ?Current Outpatient Medications on File Prior to Visit  ?Medication Sig Dispense Refill  ? docusate sodium (COLACE) 100 MG capsule Take 1 capsule (100 mg total) by mouth 2 (two) times daily. 60 capsule 0  ? rosuvastatin (CRESTOR) 20 MG tablet TAKE 1 TABLET BY MOUTH EVERY DAY 90 tablet 1  ? ?No current facility-administered medications on file prior to visit.  ? ? ? ?ROS see history of present illness ? ?Objective ? ?Physical Exam ?Vitals:  ? 09/18/21 1401  ?BP: 130/70  ?Pulse: 84  ?Temp: 98.3 ?F (36.8 ?C)  ?SpO2: 98%  ? ? ?BP Readings from Last 3 Encounters:  ?09/18/21 130/70  ?08/07/21 110/80  ?07/01/21 110/60  ? ?Wt Readings from Last 3 Encounters:  ?09/18/21 210 lb 12.8 oz (95.6 kg)  ?08/07/21 208 lb 3.2 oz (94.4 kg)  ?07/01/21 209 lb 3.2 oz (94.9 kg)  ? ? ?Physical Exam ?Chest:  ? ? ? ? ? ?Assessment/Plan: Please see individual problem list. ? ?Problem List Items Addressed This Visit   ? ? Gynecomastia, male  ?  There is still a difference in his exam between the right and left sides.  Discussed he likely still has some gynecomastia.  Discussed obtaining lab work.  He has already been scheduled for this at an appropriate time of day.  We will see what his labs reveal and then determine the next step in management. ?  ?  ? ? ?Return if symptoms worsen or fail to improve. ? ?This visit occurred during the SARS-CoV-2 public health emergency.  Safety protocols were in place, including screening questions prior to the visit, additional usage of staff PPE, and extensive cleaning of exam room while observing appropriate contact time as  indicated for disinfecting solutions.  ? ? ?[name], MD ?[place] ? ?

## 2021-09-22 ENCOUNTER — Ambulatory Visit: Admit: 2021-09-22 | Discharge: 2021-09-23 | Payer: MEDICARE

## 2021-09-22 ENCOUNTER — Other Ambulatory Visit: Admit: 2021-09-22 | Discharge: 2021-09-23 | Payer: MEDICARE

## 2021-09-22 ENCOUNTER — Ambulatory Visit
Admit: 2021-09-22 | Discharge: 2021-09-23 | Payer: MEDICARE | Attending: Student in an Organized Health Care Education/Training Program | Primary: Student in an Organized Health Care Education/Training Program

## 2021-09-22 DIAGNOSIS — C480 Malignant neoplasm of retroperitoneum: Principal | ICD-10-CM

## 2021-09-22 DIAGNOSIS — C499 Malignant neoplasm of connective and soft tissue, unspecified: Principal | ICD-10-CM

## 2021-09-22 DIAGNOSIS — R918 Other nonspecific abnormal finding of lung field: Secondary | ICD-10-CM | POA: Diagnosis not present

## 2021-09-22 DIAGNOSIS — Z803 Family history of malignant neoplasm of breast: Secondary | ICD-10-CM | POA: Diagnosis not present

## 2021-09-22 DIAGNOSIS — R7989 Other specified abnormal findings of blood chemistry: Secondary | ICD-10-CM | POA: Diagnosis not present

## 2021-09-22 DIAGNOSIS — N2889 Other specified disorders of kidney and ureter: Secondary | ICD-10-CM | POA: Diagnosis not present

## 2021-09-22 DIAGNOSIS — Z807 Family history of other malignant neoplasms of lymphoid, hematopoietic and related tissues: Secondary | ICD-10-CM | POA: Diagnosis not present

## 2021-09-22 DIAGNOSIS — E785 Hyperlipidemia, unspecified: Secondary | ICD-10-CM | POA: Diagnosis not present

## 2021-09-22 DIAGNOSIS — Z905 Acquired absence of kidney: Secondary | ICD-10-CM | POA: Diagnosis not present

## 2021-09-22 DIAGNOSIS — Z79899 Other long term (current) drug therapy: Secondary | ICD-10-CM | POA: Diagnosis not present

## 2021-09-22 DIAGNOSIS — Z806 Family history of leukemia: Secondary | ICD-10-CM | POA: Diagnosis not present

## 2021-09-22 DIAGNOSIS — N62 Hypertrophy of breast: Secondary | ICD-10-CM | POA: Diagnosis not present

## 2021-09-29 ENCOUNTER — Other Ambulatory Visit (INDEPENDENT_AMBULATORY_CARE_PROVIDER_SITE_OTHER): Payer: Medicare HMO

## 2021-09-29 ENCOUNTER — Other Ambulatory Visit: Payer: Self-pay

## 2021-09-29 ENCOUNTER — Ambulatory Visit: Payer: Medicare HMO

## 2021-09-29 ENCOUNTER — Encounter: Payer: Self-pay | Admitting: Family Medicine

## 2021-09-29 DIAGNOSIS — N179 Acute kidney failure, unspecified: Secondary | ICD-10-CM

## 2021-09-29 DIAGNOSIS — N62 Hypertrophy of breast: Secondary | ICD-10-CM | POA: Diagnosis not present

## 2021-09-29 DIAGNOSIS — R7989 Other specified abnormal findings of blood chemistry: Secondary | ICD-10-CM

## 2021-09-29 LAB — BASIC METABOLIC PANEL
BUN: 20 mg/dL (ref 6–23)
CO2: 27 mEq/L (ref 19–32)
Calcium: 8.9 mg/dL (ref 8.4–10.5)
Chloride: 106 mEq/L (ref 96–112)
Creatinine, Ser: 2 mg/dL — ABNORMAL HIGH (ref 0.40–1.50)
GFR: 34.09 mL/min — ABNORMAL LOW (ref >60.00)
Glucose, Bld: 94 mg/dL (ref 70–99)
Potassium: 4.5 mEq/L (ref 3.5–5.1)
Sodium: 140 mEq/L (ref 135–145)

## 2021-09-29 LAB — HCG, QUANTITATIVE, PREGNANCY: Quantitative HCG: <0.6 m[IU]/mL

## 2021-09-29 LAB — TESTOSTERONE: Testosterone: 394.04 ng/dL (ref 300.00–890.00)

## 2021-09-29 LAB — LUTEINIZING HORMONE: LH: 11.82 m[IU]/mL — ABNORMAL HIGH (ref 1.50–9.30)

## 2021-09-30 ENCOUNTER — Telehealth: Payer: Self-pay | Admitting: Family Medicine

## 2021-09-30 LAB — ESTRADIOL: Estradiol: 28 pg/mL (ref <=39)

## 2021-09-30 NOTE — Telephone Encounter (Signed)
Copied from [place] [phone]. Topic: Medicare AWV ?>> Sep 30, 2021 10:37 AM Harris-Coley, Hannah Beat wrote: ?Reason for CRM: Left message for patient to schedule Annual Wellness Visit.  Please schedule with Nurse Health Advisor Denisa O'Brien-Blaney, LPN at [facility].  Please call [phone] ask for Juliann Pulse ?

## 2021-10-01 NOTE — Addendum Note (Signed)
Addended by: Fulton Mole D on: 10/01/2021 04:05 PM ? ? Modules accepted: Orders ? ?

## 2021-10-01 NOTE — Progress Notes (Signed)
test

## 2021-10-02 ENCOUNTER — Other Ambulatory Visit: Payer: Self-pay | Admitting: Family Medicine

## 2021-10-02 DIAGNOSIS — N1832 Chronic kidney disease, stage 3b: Secondary | ICD-10-CM

## 2021-10-06 ENCOUNTER — Ambulatory Visit (INDEPENDENT_AMBULATORY_CARE_PROVIDER_SITE_OTHER): Payer: Medicare HMO

## 2021-10-06 ENCOUNTER — Other Ambulatory Visit: Payer: Self-pay | Admitting: Family Medicine

## 2021-10-06 VITALS — Ht 74.0 in | Wt 210.0 lb

## 2021-10-06 DIAGNOSIS — Z0000 Encounter for general adult medical examination without abnormal findings: Secondary | ICD-10-CM

## 2021-10-06 DIAGNOSIS — N62 Hypertrophy of breast: Secondary | ICD-10-CM

## 2021-10-06 NOTE — Progress Notes (Signed)
Subjective:   Larry Richardson is a 66 y.o. male who presents for an Initial Medicare Annual Wellness Visit.  Review of Systems    No ROS.  Medicare Wellness Virtual Visit.  Visual/audio telehealth visit, UTA vital signs.   See social history for additional risk factors.   Cardiac Risk Factors include: advanced age (>39men, >11 women);male gender     Objective:    Today's Vitals   10/06/21 0933  Weight: 210 lb (95.3 kg)  Height: 6\' 2"  (1.88 m)   Body mass index is 26.96 kg/m.     10/06/2021    9:36 AM 01/15/2021    5:08 PM 01/02/2021    5:12 PM 01/02/2021    6:25 AM 12/26/2020    3:23 PM  Advanced Directives  Does Patient Have a Medical Advance Directive? Yes No No Yes Yes  Type of Estate agent of [place];Living will   Healthcare Power of [place];Living will Healthcare Power of [place];Living will  Does patient want to make changes to medical advance directive? No - Patient declined  No - Patient declined No - Patient declined No - Patient declined  Copy of Healthcare Power of Attorney in Chart? No - copy requested   No - copy requested Yes - validated most recent copy scanned in chart (See row information)    Current Medications (verified) Outpatient Encounter Medications as of 10/06/2021  Medication Sig   docusate sodium (COLACE) 100 MG capsule Take 1 capsule (100 mg total) by mouth 2 (two) times daily.   rosuvastatin (CRESTOR) 20 MG tablet TAKE 1 TABLET BY MOUTH EVERY DAY   No facility-administered encounter medications on file as of 10/06/2021.    Allergies (verified) Patient has no known allergies.   History: Past Medical History:  Diagnosis Date   Cancer ([facility]) 12/2020   renal cell cancer   Chickenpox    Colon polyps    Diverticulosis    Hemorrhoids    Had blood in his stool 12-13 years ago, had colonoscopy and was found to have hemorrhoids   Hyperlipidemia    Past Surgical History:  Procedure Laterality Date   ACHILLES TENDON  REPAIR Right 1980   COLONOSCOPY     LAPAROSCOPIC NEPHRECTOMY, HAND ASSISTED Right 01/02/2021   Procedure: HAND ASSISTED LAPAROSCOPIC NEPHRECTOMY;  Surgeon: Larry Scotland, MD;  Location: [facility] ORS;  Service: Urology;  Laterality: Right;   Family History  Problem Relation Age of Onset   Breast cancer Mother 37   Breast cancer Other        Mom   Prostate cancer Other        Grandfather   Hyperlipidemia Other        Parent   Hypertension Other        Parent   Leukemia Other        Father   Social History   Socioeconomic History   Marital status: Married    Spouse name: Larry Richardson   Number of children: Not on file   Years of education: Not on file   Highest education level: Not on file  Occupational History   Occupation: was a Production designer, 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 Feels safe in his home.   His wife goes to [place] during week to take care of kids.   She will be here to help out after surgery.   Social Determinants of Health   Financial Resource Strain: Low Risk    Difficulty of Paying Living Expenses: Not hard at all  Food Insecurity: No Food Insecurity   Worried About Programme researcher, broadcasting/film/video in the Last Year: Never true   Ran Out of Food in the Last Year: Never true  Transportation Needs: No Transportation Needs   Lack of Transportation (Medical): No   Lack of Transportation (Non-Medical): No  Physical Activity: Sufficiently Active   Days of Exercise per Week: 5 days   Minutes of Exercise per Session: 60 min  Stress: No Stress Concern Present   Feeling of  Stress : Not at all  Social Connections: Unknown   Frequency of Communication with Friends and Family: Not on file   Frequency of Social Gatherings with Friends and Family: Not on file   Attends Religious Services: Not on file   Active Member of Clubs or Organizations: Not on file   Attends Banker Meetings: Not on file   Marital Status: Married    Tobacco Counseling Counseling given: Not Answered   Clinical Intake:  Pre-visit preparation completed: Yes        Diabetes: No  How often do you need to have someone help you when you read instructions, pamphlets, or other written materials from your doctor or pharmacy?: 1 - Never   Interpreter Needed?: No      Activities of Daily Living    10/06/2021    9:37 AM 01/02/2021    5:14 PM  In your present state of health, do you have any difficulty performing the following activities:  Hearing? 0 0  Vision? 0 0  Difficulty concentrating or making decisions? 0 0  Walking or climbing stairs? 0 0  Dressing or bathing? 0 0  Doing errands, shopping? 0 0  Preparing Food and eating ? N   Using the Toilet? N   In the past six months, have you accidently leaked urine? N   Do you have problems with loss of bowel control? N   Managing your Medications? N   Managing your Finances? N   Housekeeping or managing your Housekeeping? N     Patient Care Team: Larry Luis, MD as PCP - General (Family Medicine)  Indicate any recent Medical Services you may have received from other than [facility] providers in the past year (date may be approximate).     Assessment:   This is a routine wellness examination for Larry Richardson.   Virtual Visit via Telephone Note  I connected with  Larry Richardson on 10/06/21 at  9:30 AM EDT by telephone and verified that I am speaking with the correct person using two identifiers.  Persons participating in the virtual visit: patient/Nurse Health Advisor   I discussed the limitations of  performing an evaluation and management service by telehealth.  The patient expressed understanding and agreed to proceed.We continued and completed visit with audio only. Some vital signs may be absent or patient reported.   Hearing/Vision screen Hearing Screening - Comments:: Patient is able to hear conversational tones without difficulty.  No issues  reported. Vision Screening - Comments:: They have seen their ophthalmologist in the last 12 months.   Dietary issues and exercise activities discussed: Current Exercise Habits: Home exercise routine Regular diet     Goals Addressed             This Visit's Progress    Maintain Healthy Lifestyle       Stay active Healthy diet       Depression Screen    10/06/2021    9:35 AM 07/01/2021    8:34 AM 11/10/2020    1:26 PM 06/30/2020    2:51 PM 05/29/2015    2:17 PM  PHQ 2/9 Scores  PHQ - 2 Score 0 0 0 0 0    Fall Risk    10/06/2021    9:37 AM 07/01/2021    8:33 AM 11/10/2020    1:26 PM 06/30/2020    2:51 PM 05/29/2015    2:17 PM  Fall Risk   Falls in the past year? 0 0 0 0 No  Number falls in past yr: 0 0 0 0   Injury with Fall?  0     Risk for fall due to :  No Fall Risks     Follow up Falls evaluation completed Falls evaluation completed Falls evaluation completed Falls evaluation completed     FALL RISK PREVENTION PERTAINING TO THE HOME:  Home free of loose throw rugs in walkways, pet beds, electrical cords, etc? Yes  Adequate lighting in your home to reduce risk of falls? Yes   ASSISTIVE DEVICES UTILIZED TO PREVENT FALLS:  Life alert? No  Use of a cane, walker or w/c? No   TIMED UP AND GO: Was the test performed? No .   Cognitive Function:  Patient is alert and oriented x3.  Normal cognitive status assessed by direct observation/communication. No abnormalities found.        Immunizations Immunization History  Administered Date(s) Administered   Influenza, Seasonal, Injecte, Preservative Fre 04/17/2018    Influenza-Unspecified 05/12/2016, 03/13/2020, 04/13/2021   Moderna Covid-19 Vaccine Bivalent Booster 57yrs & up 04/15/2021   Moderna Sars-Covid-2 Vaccination 09/20/2019, 10/18/2019, 06/01/2020, 12/07/2020   Pneumococcal Conjugate-13 06/30/2020   Pneumococcal Polysaccharide-23 09/18/2021   Td 07/13/1999   Tdap 05/29/2015   Zoster Recombinat (Shingrix) 09/10/2018, 02/09/2019   Screening Tests Health Maintenance  Topic Date Due   COLONOSCOPY (Pts 45-67yrs Insurance coverage will need to be confirmed)  05/16/2025   TETANUS/TDAP  05/28/2025   Pneumonia Vaccine 8+ Years old  Completed   INFLUENZA VACCINE  Completed   COVID-19 Vaccine  Completed   Hepatitis C Screening  Completed   Zoster Vaccines- Shingrix  Completed   HPV VACCINES  Aged Out   Health Maintenance There are no preventive care reminders to display for this patient.  Lung Cancer Screening: (Low Dose CT Chest recommended if Age 41-80 years, 30 pack-year currently smoking OR have quit w/in 15years.) does not qualify.   Hepatitis C Screening: Completed   Vision Screening: Recommended annual ophthalmology exams for early detection of glaucoma and other disorders of the eye.  Dental Screening: Recommended annual dental exams for proper oral hygiene  Community Resource Referral / Chronic Care Management: CRR required this visit?  No   CCM required this visit?  No      Plan:     I have personally reviewed and noted the following in the patient's chart:   Medical and social history Use of alcohol, tobacco or illicit drugs  Current medications and  supplements including opioid prescriptions. Patient is not currently taking opioid prescriptions. Functional ability and status Nutritional status Physical activity Advanced directives List of other physicians Hospitalizations, surgeries, and ER visits in previous 12 months Vitals Screenings to include cognitive, depression, and falls Referrals and appointments  In  addition, I have reviewed and discussed with patient certain preventive protocols, quality metrics, and best practice recommendations. A written personalized care plan for preventive services as well as general preventive health recommendations were provided to patient.     Ashok Pall, LPN   1/61/0960

## 2021-10-06 NOTE — Patient Instructions (Addendum)
?  Larry Richardson , ?Thank you for taking time to come for your Medicare Wellness Visit. I appreciate your ongoing commitment to your health goals. Please review the following plan we discussed and let me know if I can assist you in the future.  ? ?These are the goals we discussed: ? Goals   ? ?  Maintain Healthy Lifestyle   ?  Stay active ?Healthy diet ?  ? ?  ?  ?This is a list of the screening recommended for you and due dates:  ?Health Maintenance  ?Topic Date Due  ? Colon Cancer Screening  05/16/2025  ? Tetanus Vaccine  05/28/2025  ? Pneumonia Vaccine  Completed  ? Flu Shot  Completed  ? COVID-19 Vaccine  Completed  ? Hepatitis C Screening: USPSTF Recommendation to screen - Ages 44-79 yo.  Completed  ? Zoster (Shingles) Vaccine  Completed  ? HPV Vaccine  Aged Out  ?  ?

## 2021-10-12 ENCOUNTER — Ambulatory Visit: Admit: 2021-10-12 | Discharge: 2021-10-12 | Payer: MEDICARE

## 2021-10-12 DIAGNOSIS — C480 Malignant neoplasm of retroperitoneum: Principal | ICD-10-CM

## 2021-10-12 DIAGNOSIS — C499 Malignant neoplasm of connective and soft tissue, unspecified: Principal | ICD-10-CM

## 2021-10-12 DIAGNOSIS — Z452 Encounter for adjustment and management of vascular access device: Secondary | ICD-10-CM | POA: Diagnosis not present

## 2021-11-09 ENCOUNTER — Other Ambulatory Visit (INDEPENDENT_AMBULATORY_CARE_PROVIDER_SITE_OTHER): Payer: Medicare HMO

## 2021-11-09 DIAGNOSIS — N62 Hypertrophy of breast: Secondary | ICD-10-CM | POA: Diagnosis not present

## 2021-11-09 LAB — TESTOSTERONE: Testosterone: 403.11 ng/dL (ref 300.00–890.00)

## 2021-12-03 DIAGNOSIS — R8290 Unspecified abnormal findings in urine: Secondary | ICD-10-CM | POA: Insufficient documentation

## 2021-12-03 DIAGNOSIS — N1832 Chronic kidney disease, stage 3b: Secondary | ICD-10-CM | POA: Insufficient documentation

## 2021-12-03 DIAGNOSIS — Z905 Acquired absence of kidney: Secondary | ICD-10-CM | POA: Insufficient documentation

## 2021-12-03 DIAGNOSIS — D6949 Other primary thrombocytopenia: Secondary | ICD-10-CM | POA: Insufficient documentation

## 2021-12-03 DIAGNOSIS — C480 Malignant neoplasm of retroperitoneum: Secondary | ICD-10-CM | POA: Diagnosis not present

## 2021-12-22 ENCOUNTER — Ambulatory Visit
Admit: 2021-12-22 | Discharge: 2021-12-23 | Payer: MEDICARE | Attending: Student in an Organized Health Care Education/Training Program | Primary: Student in an Organized Health Care Education/Training Program

## 2021-12-22 ENCOUNTER — Ambulatory Visit: Admit: 2021-12-22 | Discharge: 2021-12-23 | Payer: MEDICARE

## 2021-12-22 ENCOUNTER — Other Ambulatory Visit: Admit: 2021-12-22 | Discharge: 2021-12-23 | Payer: MEDICARE

## 2021-12-22 DIAGNOSIS — C499 Malignant neoplasm of connective and soft tissue, unspecified: Principal | ICD-10-CM

## 2021-12-22 DIAGNOSIS — C480 Malignant neoplasm of retroperitoneum: Principal | ICD-10-CM

## 2021-12-22 DIAGNOSIS — K449 Diaphragmatic hernia without obstruction or gangrene: Secondary | ICD-10-CM | POA: Diagnosis not present

## 2021-12-22 DIAGNOSIS — R911 Solitary pulmonary nodule: Secondary | ICD-10-CM | POA: Diagnosis not present

## 2021-12-22 DIAGNOSIS — N400 Enlarged prostate without lower urinary tract symptoms: Secondary | ICD-10-CM | POA: Diagnosis not present

## 2021-12-22 DIAGNOSIS — R918 Other nonspecific abnormal finding of lung field: Secondary | ICD-10-CM | POA: Diagnosis not present

## 2021-12-22 DIAGNOSIS — K429 Umbilical hernia without obstruction or gangrene: Secondary | ICD-10-CM | POA: Diagnosis not present

## 2021-12-30 ENCOUNTER — Ambulatory Visit (INDEPENDENT_AMBULATORY_CARE_PROVIDER_SITE_OTHER): Payer: Medicare HMO | Admitting: Family Medicine

## 2021-12-30 ENCOUNTER — Encounter: Payer: Self-pay | Admitting: Family Medicine

## 2021-12-30 DIAGNOSIS — C499 Malignant neoplasm of connective and soft tissue, unspecified: Secondary | ICD-10-CM | POA: Diagnosis not present

## 2021-12-30 DIAGNOSIS — E7800 Pure hypercholesterolemia, unspecified: Secondary | ICD-10-CM

## 2021-12-30 DIAGNOSIS — N62 Hypertrophy of breast: Secondary | ICD-10-CM | POA: Diagnosis not present

## 2021-12-30 DIAGNOSIS — N1832 Chronic kidney disease, stage 3b: Secondary | ICD-10-CM | POA: Diagnosis not present

## 2021-12-30 NOTE — Assessment & Plan Note (Signed)
Continue Crestor 20 mg daily.  Plan for lipid panel in December.

## 2021-12-30 NOTE — Assessment & Plan Note (Signed)
Discussed avoidance of NSAIDs.  He will continue to follow with nephrology.

## 2021-12-30 NOTE — Progress Notes (Signed)
[name], MD Phone: [phone]  Larry Richardson is a 66 y.o. male who presents today for f/.u  HYPERLIPIDEMIA Symptoms Chest pain on exertion:  no   Leg claudication:   no Medications: Compliance- taking crestor Right upper quadrant pain- no  Muscle aches- no Lipid Panel     Component Value Date/Time   CHOL 110 07/01/2021 0902   TRIG 90.0 07/01/2021 0902   HDL 30.80 (L) 07/01/2021 0902   CHOLHDL 4 07/01/2021 0902   VLDL 18.0 07/01/2021 0902   LDLCALC 61 07/01/2021 0902   LDLDIRECT 57.0 08/18/2020 1031   CKD stage IIIb: Patient saw nephrology.  They discussed dietary changes and eliminating NSAIDs.  His oncologist has referred him to nephrology at [facility] to get their input as well.  Dedifferentiated liposarcoma of the right kidney: Patient has had surveillance scans through oncology that have not shown any evidence of recurrent disease.  He continues to follow-up with oncology.  Gynecomastia: Patient reports there is no soreness.  He notes they are still puffier than they were previously.  His oncologist feels this may be related to olanzapine based on review of their note.  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  Social History   Tobacco Use  Smoking Status Never   Passive exposure: Never  Smokeless Tobacco Never    Current Outpatient Medications on File Prior to Visit  Medication Sig Dispense Refill   docusate sodium (COLACE) 100 MG capsule Take 1 capsule (100 mg total) by mouth 2 (two) times daily. 60 capsule 0   rosuvastatin (CRESTOR) 20 MG tablet TAKE 1 TABLET BY MOUTH EVERY DAY 90 tablet 1   No current facility-administered medications on file prior to visit.     ROS see history of present illness  Objective  Physical Exam Vitals:   12/30/21 0828  BP: 120/78  Pulse: 89  Temp: 97.8 F (36.6 C)  SpO2: 95%    BP Readings from Last 3 Encounters:  12/30/21 120/78  09/18/21 130/70  08/07/21 110/80   Wt Readings from  Last 3 Encounters:  12/30/21 204 lb 3.2 oz (92.6 kg)  10/06/21 210 lb (95.3 kg)  09/18/21 210 lb 12.8 oz (95.6 kg)    Physical Exam Constitutional:      General: He is not in acute distress.    Appearance: He is not diaphoretic.  Cardiovascular:     Rate and Rhythm: Normal rate and regular rhythm.     Heart sounds: Normal heart sounds.  Pulmonary:     Effort: Pulmonary effort is normal.     Breath sounds: Normal breath sounds.  Musculoskeletal:     Right lower leg: No edema.     Left lower leg: No edema.  Skin:    General: Skin is warm and dry.  Neurological:     Mental Status: He is alert.      Assessment/Plan: Please see individual problem list.  Problem List Items Addressed This Visit     Elevated LDL cholesterol level (Chronic)    Continue Crestor 20 mg daily.  Plan for lipid panel in December.      Gynecomastia, male (Chronic)    Possibly related to the olanzapine he took previously though I would have expected this to improve after coming off of that medication.  This could also be a result of early hypogonadism.  We will plan on rechecking a testosterone level in December.      Stage 3b chronic kidney disease (HCC) (Chronic)  Discussed avoidance of NSAIDs.  He will continue to follow with nephrology.      Liposarcoma ([place])    Continues to undergo surveillance through oncology.  Has not had any evidence of recurrent disease on CT scans per the oncology note.  He will continue to see his oncologist.       Return in about 6 months (around 07/01/2022) for CPE.   [name], MD [place]

## 2021-12-30 NOTE — Assessment & Plan Note (Signed)
Possibly related to the olanzapine he took previously though I would have expected this to improve after coming off of that medication.  This could also be a result of early hypogonadism.  We will plan on rechecking a testosterone level in December.

## 2021-12-30 NOTE — Assessment & Plan Note (Signed)
Continues to undergo surveillance through oncology.  Has not had any evidence of recurrent disease on CT scans per the oncology note.  He will continue to see his oncologist.

## 2022-01-01 DIAGNOSIS — E785 Hyperlipidemia, unspecified: Secondary | ICD-10-CM | POA: Diagnosis not present

## 2022-01-01 DIAGNOSIS — Z85528 Personal history of other malignant neoplasm of kidney: Secondary | ICD-10-CM | POA: Diagnosis not present

## 2022-01-01 DIAGNOSIS — Z803 Family history of malignant neoplasm of breast: Secondary | ICD-10-CM | POA: Diagnosis not present

## 2022-01-01 DIAGNOSIS — Z8249 Family history of ischemic heart disease and other diseases of the circulatory system: Secondary | ICD-10-CM | POA: Diagnosis not present

## 2022-01-01 DIAGNOSIS — K5900 Constipation, unspecified: Secondary | ICD-10-CM | POA: Diagnosis not present

## 2022-01-21 ENCOUNTER — Other Ambulatory Visit: Payer: Self-pay | Admitting: Family Medicine

## 2022-01-21 DIAGNOSIS — E785 Hyperlipidemia, unspecified: Secondary | ICD-10-CM

## 2022-01-25 DIAGNOSIS — D2372 Other benign neoplasm of skin of left lower limb, including hip: Secondary | ICD-10-CM | POA: Diagnosis not present

## 2022-01-25 DIAGNOSIS — L738 Other specified follicular disorders: Secondary | ICD-10-CM | POA: Diagnosis not present

## 2022-01-25 DIAGNOSIS — Z85828 Personal history of other malignant neoplasm of skin: Secondary | ICD-10-CM | POA: Diagnosis not present

## 2022-01-25 DIAGNOSIS — D2371 Other benign neoplasm of skin of right lower limb, including hip: Secondary | ICD-10-CM | POA: Diagnosis not present

## 2022-01-25 DIAGNOSIS — D2262 Melanocytic nevi of left upper limb, including shoulder: Secondary | ICD-10-CM | POA: Diagnosis not present

## 2022-01-25 DIAGNOSIS — D2261 Melanocytic nevi of right upper limb, including shoulder: Secondary | ICD-10-CM | POA: Diagnosis not present

## 2022-01-25 DIAGNOSIS — L821 Other seborrheic keratosis: Secondary | ICD-10-CM | POA: Diagnosis not present

## 2022-01-25 DIAGNOSIS — L814 Other melanin hyperpigmentation: Secondary | ICD-10-CM | POA: Diagnosis not present

## 2022-01-25 DIAGNOSIS — D2271 Melanocytic nevi of right lower limb, including hip: Secondary | ICD-10-CM | POA: Diagnosis not present

## 2022-01-25 DIAGNOSIS — C44519 Basal cell carcinoma of skin of other part of trunk: Secondary | ICD-10-CM | POA: Diagnosis not present

## 2022-01-25 DIAGNOSIS — D485 Neoplasm of uncertain behavior of skin: Secondary | ICD-10-CM | POA: Diagnosis not present

## 2022-02-03 ENCOUNTER — Encounter: Payer: Self-pay | Admitting: Family Medicine

## 2022-02-03 DIAGNOSIS — N1832 Chronic kidney disease, stage 3b: Secondary | ICD-10-CM

## 2022-02-04 ENCOUNTER — Other Ambulatory Visit (INDEPENDENT_AMBULATORY_CARE_PROVIDER_SITE_OTHER): Payer: Medicare HMO

## 2022-02-04 DIAGNOSIS — N1832 Chronic kidney disease, stage 3b: Secondary | ICD-10-CM

## 2022-02-04 LAB — BASIC METABOLIC PANEL
BUN: 22 mg/dL (ref 6–23)
CO2: 27 mEq/L (ref 19–32)
Calcium: 9.1 mg/dL (ref 8.4–10.5)
Chloride: 103 mEq/L (ref 96–112)
Creatinine, Ser: 2.07 mg/dL — ABNORMAL HIGH (ref 0.40–1.50)
GFR: 32.63 mL/min — ABNORMAL LOW (ref >60.00)
Glucose, Bld: 90 mg/dL (ref 70–99)
Potassium: 4.3 mEq/L (ref 3.5–5.1)
Sodium: 137 mEq/L (ref 135–145)

## 2022-03-30 ENCOUNTER — Other Ambulatory Visit: Admit: 2022-03-30 | Discharge: 2022-03-30 | Payer: MEDICARE

## 2022-03-30 ENCOUNTER — Ambulatory Visit: Admit: 2022-03-30 | Discharge: 2022-03-30 | Payer: MEDICARE

## 2022-03-30 ENCOUNTER — Ambulatory Visit
Admit: 2022-03-30 | Discharge: 2022-03-30 | Payer: MEDICARE | Attending: Student in an Organized Health Care Education/Training Program | Primary: Student in an Organized Health Care Education/Training Program

## 2022-03-30 DIAGNOSIS — C480 Malignant neoplasm of retroperitoneum: Principal | ICD-10-CM

## 2022-03-30 DIAGNOSIS — C499 Malignant neoplasm of connective and soft tissue, unspecified: Principal | ICD-10-CM

## 2022-03-30 DIAGNOSIS — C494 Malignant neoplasm of connective and soft tissue of abdomen: Principal | ICD-10-CM

## 2022-03-30 DIAGNOSIS — Z803 Family history of malignant neoplasm of breast: Secondary | ICD-10-CM | POA: Diagnosis not present

## 2022-03-30 DIAGNOSIS — K7689 Other specified diseases of liver: Secondary | ICD-10-CM | POA: Diagnosis not present

## 2022-03-30 DIAGNOSIS — K449 Diaphragmatic hernia without obstruction or gangrene: Secondary | ICD-10-CM | POA: Diagnosis not present

## 2022-03-30 DIAGNOSIS — R918 Other nonspecific abnormal finding of lung field: Secondary | ICD-10-CM | POA: Diagnosis not present

## 2022-03-30 DIAGNOSIS — E785 Hyperlipidemia, unspecified: Secondary | ICD-10-CM | POA: Diagnosis not present

## 2022-03-30 DIAGNOSIS — K769 Liver disease, unspecified: Secondary | ICD-10-CM | POA: Diagnosis not present

## 2022-04-16 ENCOUNTER — Ambulatory Visit: Admit: 2022-04-16 | Discharge: 2022-04-16 | Payer: MEDICARE

## 2022-04-16 DIAGNOSIS — N179 Acute kidney failure, unspecified: Principal | ICD-10-CM

## 2022-04-16 DIAGNOSIS — N183 Stage 3 chronic kidney disease, unspecified whether stage 3a or 3b CKD (CMS-HCC): Principal | ICD-10-CM

## 2022-04-16 DIAGNOSIS — C499 Malignant neoplasm of connective and soft tissue, unspecified: Principal | ICD-10-CM

## 2022-06-30 ENCOUNTER — Ambulatory Visit (INDEPENDENT_AMBULATORY_CARE_PROVIDER_SITE_OTHER): Payer: Medicare HMO | Admitting: Family Medicine

## 2022-06-30 ENCOUNTER — Encounter: Payer: Self-pay | Admitting: Family Medicine

## 2022-06-30 VITALS — BP 110/60 | HR 80 | Temp 97.4°F | Ht 74.0 in | Wt 206.2 lb

## 2022-06-30 DIAGNOSIS — E663 Overweight: Secondary | ICD-10-CM

## 2022-06-30 DIAGNOSIS — Z125 Encounter for screening for malignant neoplasm of prostate: Secondary | ICD-10-CM

## 2022-06-30 DIAGNOSIS — E7800 Pure hypercholesterolemia, unspecified: Secondary | ICD-10-CM | POA: Diagnosis not present

## 2022-06-30 DIAGNOSIS — H6992 Unspecified Eustachian tube disorder, left ear: Secondary | ICD-10-CM | POA: Diagnosis not present

## 2022-06-30 DIAGNOSIS — Z0001 Encounter for general adult medical examination with abnormal findings: Secondary | ICD-10-CM | POA: Diagnosis not present

## 2022-06-30 LAB — HEPATIC FUNCTION PANEL
ALT: 30 U/L (ref 0–53)
AST: 26 U/L (ref 0–37)
Albumin: 4.5 g/dL (ref 3.5–5.2)
Alkaline Phosphatase: 54 U/L (ref 39–117)
Bilirubin, Direct: 0.2 mg/dL (ref 0.0–0.3)
Total Bilirubin: 0.8 mg/dL (ref 0.2–1.2)
Total Protein: 6.3 g/dL (ref 6.0–8.3)

## 2022-06-30 LAB — LIPID PANEL
Cholesterol: 100 mg/dL (ref 0–200)
HDL: 33.4 mg/dL — ABNORMAL LOW (ref >39.00)
LDL Cholesterol: 52 mg/dL (ref 0–99)
NonHDL: 66.85
Total CHOL/HDL Ratio: 3
Triglycerides: 72 mg/dL (ref 0.0–149.0)
VLDL: 14.4 mg/dL (ref 0.0–40.0)

## 2022-06-30 LAB — PSA, MEDICARE: PSA: 3.29 ng/ml (ref 0.10–4.00)

## 2022-06-30 LAB — HEMOGLOBIN A1C: Hgb A1c MFr Bld: 5.6 % (ref 4.6–6.5)

## 2022-06-30 NOTE — Assessment & Plan Note (Signed)
Physical exam completed.  Encouraged healthy diet and exercise.  PSA for prostate cancer screening.  Colonoscopy is up-to-date.  I encouraged him to get the RSV vaccine at the pharmacy.  Other vaccinations are up-to-date.  Lab work as outlined.

## 2022-06-30 NOTE — Progress Notes (Signed)
[name], MD Phone: [phone]  Larry Richardson is a 66 y.o. male who presents today for CPE.  Diet: has been eating too much, though does have a balanced diet with limited salt intake Exercise: has been staying away from the gym as he had covid a month or so ago, plans to start back after the holidays Colonoscopy: 05/16/20 Prostate cancer screening: due Family history-  Prostate cancer: no  Colon cancer: no Vaccines-   Flu: UTD  Tetanus: UTD  Shingles: UTD  COVID19: UTD  Pneumonia: UTD  RSV: due Hep C Screening: UTD Tobacco use: no Alcohol use: 0-3/JKKX Illicit Drug use: no Dentist: yes Ophthalmology: yes  Left ear fullness: Patient says has been going on 3 weeks.  It just feels stopped up.  There is no pain.  No tinnitus.  No dizziness.  Mild hearing decrease.  He tried earwax drops with no benefit.   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   Stage 3b chronic kidney disease ([place]) 12/03/2021   Encounter for general adult medical examination with abnormal findings 06/30/2022   Eustachian tube dysfunction, left 06/30/2022   Resolved Ambulatory Problems    Diagnosis Date Noted   Encounter for general adult medical examination with abnormal findings 05/29/2015   Sore throat 08/04/2015   Right buttock pain 06/30/2020   Vomiting 11/10/2020   Right renal mass 01/02/2021   Sinus congestion 05/06/2021   Past Medical History:  Diagnosis Date   Cancer ([place]) 12/2020   Chickenpox    Colon polyps    Diverticulosis    Hemorrhoids    Hyperlipidemia     Family History  Problem Relation Age of Onset   Breast cancer Mother 29   Breast cancer Other        Mom   Prostate  cancer Other        Grandfather   Hyperlipidemia Other        Parent   Hypertension Other        Parent   Leukemia Other        Father    Social History   Socioeconomic History   Marital status: Married    Spouse name: Santiago Glad   Number of children: Not on file   Years of education: Not on file   Highest education level: Not on file  Occupational History   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 Feels safe in his home.   His wife goes to [place] during week to take care of kids.   She will be here to help out after surgery.   Social Determinants of Health   Financial Resource Strain: Low Risk  (10/06/2021)  Overall Financial Resource Strain (CARDIA)    Difficulty of Paying Living Expenses: Not hard at all  Food Insecurity: No Food Insecurity (10/06/2021)   Hunger Vital Sign    Worried About Running Out of Food in the Last Year: Never true    Ran Out of Food in the Last Year: Never true  Transportation Needs: No Transportation Needs (10/06/2021)   PRAPARE - Hydrologist (Medical): No    Lack of Transportation (Non-Medical): No  Physical Activity: Sufficiently Active (10/06/2021)   Exercise Vital Sign    Days of Exercise per Week: 5 days    Minutes of Exercise per Session: 60 min  Stress: No Stress Concern Present (10/06/2021)   [place]    Feeling of Stress : Not at all  Social Connections: Unknown (10/06/2021)   Social Connection and  Isolation Panel [NHANES]    Frequency of Communication with Friends and Family: Not on file    Frequency of Social Gatherings with Friends and Family: Not on file    Attends Religious Services: Not on file    Active Member of Clubs or Organizations: Not on file    Attends Archivist Meetings: Not on file    Marital Status: Married  Intimate Partner Violence: Not At Risk (10/06/2021)   Humiliation, Afraid, Rape, and Kick questionnaire    Fear of Current or Ex-Partner: No    Emotionally Abused: No    Physically Abused: No    Sexually Abused: No    ROS  General:  Negative for nexplained weight loss, fever Skin: Negative for new or changing mole, sore that won't heal HEENT: Negative for trouble hearing, trouble seeing, ringing in ears, mouth sores, hoarseness, change in voice, dysphagia. CV:  Negative for chest pain, dyspnea, edema, palpitations Resp: Negative for cough, dyspnea, hemoptysis GI: Negative for nausea, vomiting, diarrhea, constipation, abdominal pain, melena, hematochezia. GU: Negative for dysuria, incontinence, urinary hesitance, hematuria, vaginal or penile discharge, polyuria, sexual difficulty, lumps in testicle or breasts MSK: Negative for muscle cramps or aches, joint pain or swelling Neuro: Negative for headaches, weakness, numbness, dizziness, passing out/fainting Psych: Negative for depression, anxiety, memory problems  Objective  Physical Exam Vitals:   06/30/22 0858  BP: 110/60  Pulse: 80  Temp: (!) 97.4 F (36.3 C)  SpO2: 98%    BP Readings from Last 3 Encounters:  06/30/22 110/60  12/30/21 120/78  09/18/21 130/70   Wt Readings from Last 3 Encounters:  06/30/22 206 lb 3.2 oz (93.5 kg)  12/30/21 204 lb 3.2 oz (92.6 kg)  10/06/21 210 lb (95.3 kg)    Physical Exam Constitutional:      General: He is not in acute distress.    Appearance: He is not diaphoretic.  HENT:     Head: Normocephalic and atraumatic.     Right Ear: Tympanic  membrane normal.     Left Ear: Tympanic membrane normal.  Cardiovascular:     Rate and Rhythm: Normal rate and regular rhythm.     Heart sounds: Normal heart sounds.  Pulmonary:     Effort: Pulmonary effort is normal.     Breath sounds: Normal breath sounds.  Abdominal:     General: Bowel sounds are normal. There is no distension.     Palpations: Abdomen is soft.     Tenderness: There is no abdominal tenderness.  Musculoskeletal:     Right lower leg: No edema.  Left lower leg: No edema.  Lymphadenopathy:     Cervical: No cervical adenopathy.  Skin:    General: Skin is warm and dry.  Neurological:     Mental Status: He is alert.  Psychiatric:        Mood and Affect: Mood normal.      Assessment/Plan:   Encounter for general adult medical examination with abnormal findings Assessment & Plan: Physical exam completed.  Encouraged healthy diet and exercise.  PSA for prostate cancer screening.  Colonoscopy is up-to-date.  I encouraged him to get the RSV vaccine at the pharmacy.  Other vaccinations are up-to-date.  Lab work as outlined.   Eustachian tube dysfunction, left Assessment & Plan: Discussed symptoms are likely related to eustachian tube dysfunction.  Discussed he could try Nasacort or Flonase and he could also try a second-generation antihistamine.  He can try these in combination and on their own.  If neither of these things are helpful he will let me know and we can refer to ENT.   Elevated LDL cholesterol level -     Hepatic function panel -     Lipid panel  Overweight -     Hemoglobin A1c  Prostate cancer screening -     PSA, Medicare    Return in about 1 year (around 07/01/2023) for physical, labs 2 days prior.   [name], MD [place]

## 2022-06-30 NOTE — Assessment & Plan Note (Signed)
Discussed symptoms are likely related to eustachian tube dysfunction.  Discussed he could try Nasacort or Flonase and he could also try a second-generation antihistamine.  He can try these in combination and on their own.  If neither of these things are helpful he will let me know and we can refer to ENT.

## 2022-07-02 ENCOUNTER — Other Ambulatory Visit: Payer: Self-pay

## 2022-07-02 DIAGNOSIS — R972 Elevated prostate specific antigen [PSA]: Secondary | ICD-10-CM

## 2022-07-02 NOTE — Progress Notes (Signed)
PSA ordered

## 2022-07-11 ENCOUNTER — Other Ambulatory Visit: Payer: Self-pay | Admitting: Family Medicine

## 2022-07-11 DIAGNOSIS — E785 Hyperlipidemia, unspecified: Secondary | ICD-10-CM

## 2022-07-15 ENCOUNTER — Encounter: Payer: Self-pay | Admitting: Family Medicine

## 2022-07-15 DIAGNOSIS — H938X2 Other specified disorders of left ear: Secondary | ICD-10-CM

## 2022-07-20 ENCOUNTER — Other Ambulatory Visit: Admit: 2022-07-20 | Discharge: 2022-07-20 | Payer: MEDICARE

## 2022-07-20 ENCOUNTER — Ambulatory Visit: Admit: 2022-07-20 | Discharge: 2022-07-20 | Payer: MEDICARE

## 2022-07-20 DIAGNOSIS — N1832 Stage 3b chronic kidney disease (CMS-HCC): Principal | ICD-10-CM

## 2022-07-20 DIAGNOSIS — C499 Malignant neoplasm of connective and soft tissue, unspecified: Principal | ICD-10-CM

## 2022-07-20 DIAGNOSIS — C480 Malignant neoplasm of retroperitoneum: Principal | ICD-10-CM

## 2022-07-20 DIAGNOSIS — N62 Hypertrophy of breast: Secondary | ICD-10-CM | POA: Diagnosis not present

## 2022-07-20 DIAGNOSIS — E785 Hyperlipidemia, unspecified: Secondary | ICD-10-CM | POA: Diagnosis not present

## 2022-07-20 DIAGNOSIS — K7689 Other specified diseases of liver: Secondary | ICD-10-CM | POA: Diagnosis not present

## 2022-07-20 DIAGNOSIS — Z905 Acquired absence of kidney: Secondary | ICD-10-CM | POA: Diagnosis not present

## 2022-07-20 DIAGNOSIS — K449 Diaphragmatic hernia without obstruction or gangrene: Secondary | ICD-10-CM | POA: Diagnosis not present

## 2022-07-20 DIAGNOSIS — R918 Other nonspecific abnormal finding of lung field: Secondary | ICD-10-CM | POA: Diagnosis not present

## 2022-07-20 DIAGNOSIS — R911 Solitary pulmonary nodule: Secondary | ICD-10-CM | POA: Diagnosis not present

## 2022-07-20 DIAGNOSIS — K8020 Calculus of gallbladder without cholecystitis without obstruction: Secondary | ICD-10-CM | POA: Diagnosis not present

## 2022-07-20 DIAGNOSIS — Z803 Family history of malignant neoplasm of breast: Secondary | ICD-10-CM | POA: Diagnosis not present

## 2022-07-23 ENCOUNTER — Ambulatory Visit: Admit: 2022-07-23 | Discharge: 2022-07-24 | Payer: MEDICARE

## 2022-07-23 DIAGNOSIS — N1832 Stage 3b chronic kidney disease (CMS-HCC): Principal | ICD-10-CM

## 2022-07-23 DIAGNOSIS — Z905 Acquired absence of kidney: Principal | ICD-10-CM

## 2022-07-27 IMAGING — DX DG CHEST 1V PORT
1 series · 1 of 1 positions shown · non-contrast
Comparison: Chest CT 01/15/2021

CLINICAL DATA: Syncope

EXAM:
PORTABLE CHEST 1 VIEW

[chest ap]
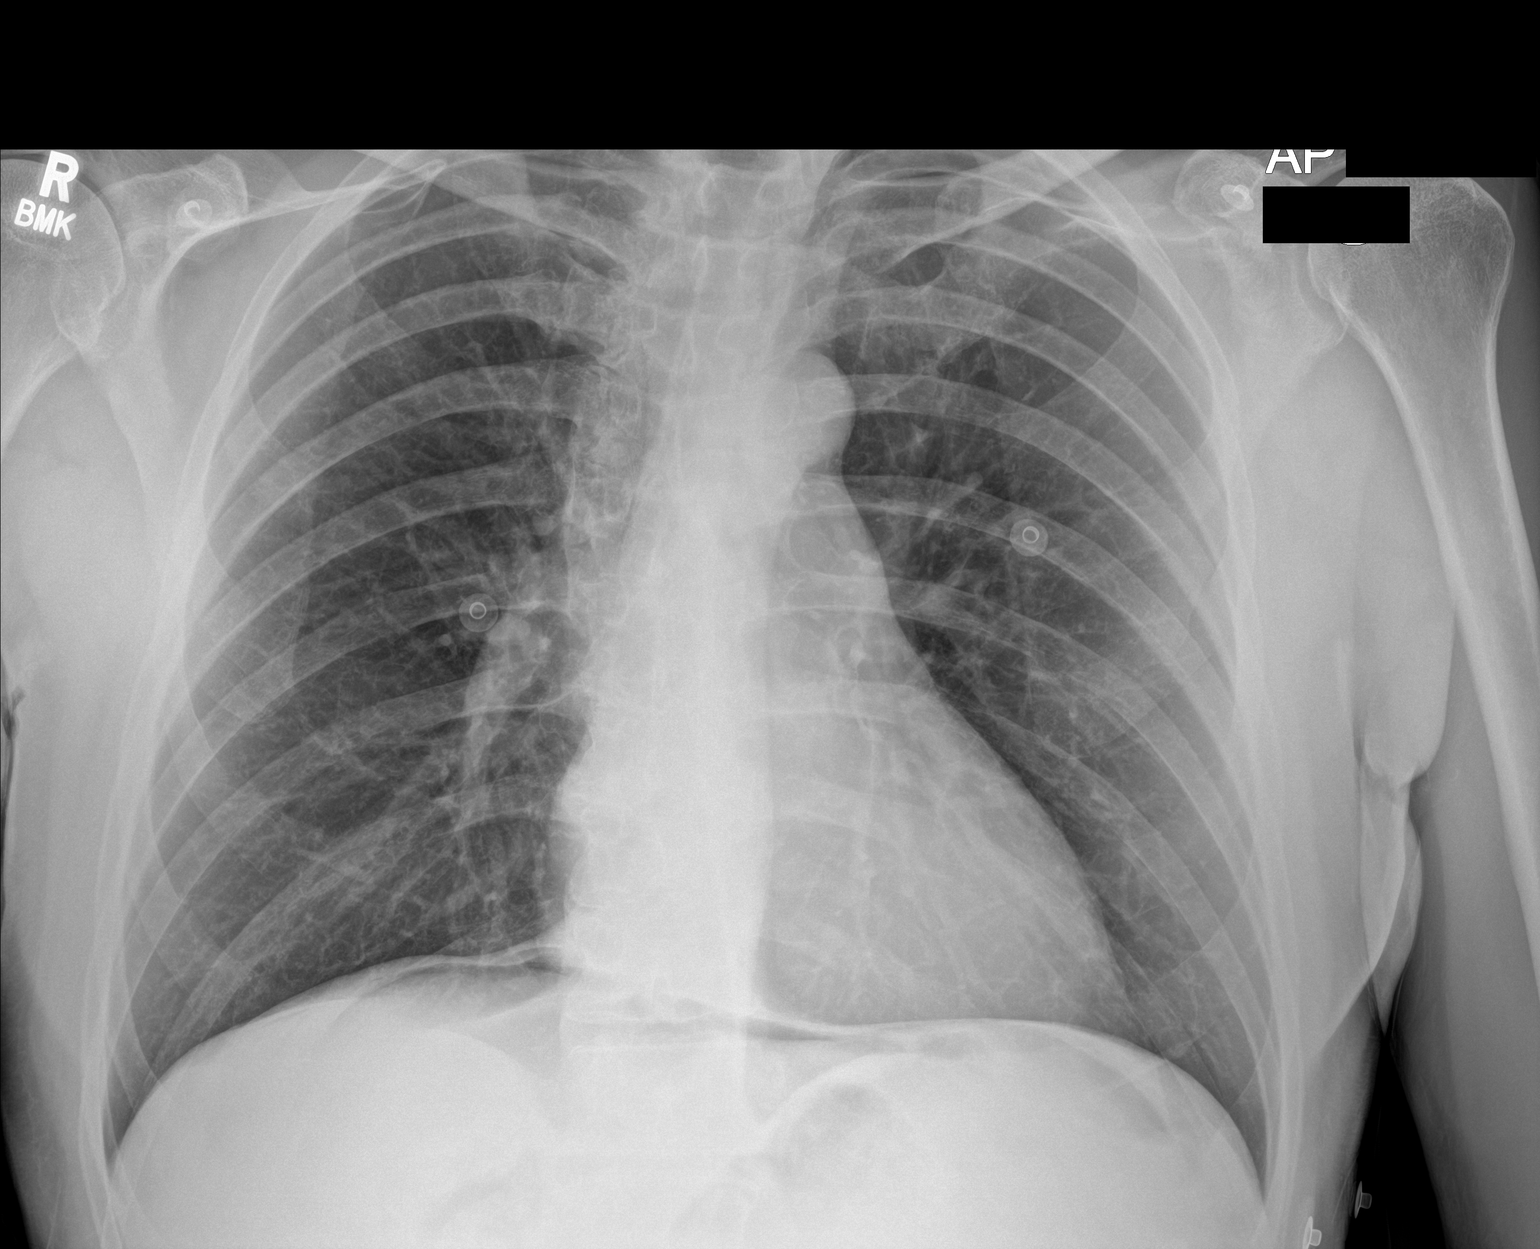

[1 of 1 positions shown; findings below may reference images not displayed]

FINDINGS: The heart size and mediastinal contours are within normal limits.
Both lungs are clear. The visualized skeletal structures are
unremarkable. There is free air beneath the diaphragm.
IMPRESSION: 1. Clear lung fields.
2. Free air beneath the diaphragm as seen on chest CT performed
earlier today. Consider further evaluation with dedicated abdominal
and pelvic CT as concern was raised for bowel perforation on the
chest CT performed earlier today.

## 2022-08-01 DIAGNOSIS — K5900 Constipation, unspecified: Secondary | ICD-10-CM | POA: Diagnosis not present

## 2022-08-01 DIAGNOSIS — Z85528 Personal history of other malignant neoplasm of kidney: Secondary | ICD-10-CM | POA: Diagnosis not present

## 2022-08-01 DIAGNOSIS — R030 Elevated blood-pressure reading, without diagnosis of hypertension: Secondary | ICD-10-CM | POA: Diagnosis not present

## 2022-08-01 DIAGNOSIS — E785 Hyperlipidemia, unspecified: Secondary | ICD-10-CM | POA: Diagnosis not present

## 2022-08-01 DIAGNOSIS — N189 Chronic kidney disease, unspecified: Secondary | ICD-10-CM | POA: Diagnosis not present

## 2022-08-01 DIAGNOSIS — J309 Allergic rhinitis, unspecified: Secondary | ICD-10-CM | POA: Diagnosis not present

## 2022-08-10 DIAGNOSIS — H903 Sensorineural hearing loss, bilateral: Secondary | ICD-10-CM | POA: Diagnosis not present

## 2022-08-10 DIAGNOSIS — H90A22 Sensorineural hearing loss, unilateral, left ear, with restricted hearing on the contralateral side: Secondary | ICD-10-CM | POA: Diagnosis not present

## 2022-08-10 DIAGNOSIS — H6122 Impacted cerumen, left ear: Secondary | ICD-10-CM | POA: Diagnosis not present

## 2022-08-10 DIAGNOSIS — H60332 Swimmer's ear, left ear: Secondary | ICD-10-CM | POA: Diagnosis not present

## 2022-08-13 ENCOUNTER — Other Ambulatory Visit (INDEPENDENT_AMBULATORY_CARE_PROVIDER_SITE_OTHER): Payer: Medicare HMO

## 2022-08-13 DIAGNOSIS — R972 Elevated prostate specific antigen [PSA]: Secondary | ICD-10-CM | POA: Diagnosis not present

## 2022-08-13 LAB — PSA: PSA: 3.14 ng/mL (ref 0.10–4.00)

## 2022-08-16 ENCOUNTER — Other Ambulatory Visit: Payer: Self-pay | Admitting: Family Medicine

## 2022-08-16 DIAGNOSIS — R972 Elevated prostate specific antigen [PSA]: Secondary | ICD-10-CM

## 2022-08-26 DIAGNOSIS — H2511 Age-related nuclear cataract, right eye: Secondary | ICD-10-CM | POA: Diagnosis not present

## 2022-08-26 DIAGNOSIS — H35379 Puckering of macula, unspecified eye: Secondary | ICD-10-CM | POA: Diagnosis not present

## 2022-08-26 DIAGNOSIS — H2512 Age-related nuclear cataract, left eye: Secondary | ICD-10-CM | POA: Diagnosis not present

## 2022-08-26 DIAGNOSIS — H43813 Vitreous degeneration, bilateral: Secondary | ICD-10-CM | POA: Diagnosis not present

## 2022-09-02 DIAGNOSIS — H60332 Swimmer's ear, left ear: Secondary | ICD-10-CM | POA: Diagnosis not present

## 2022-09-14 ENCOUNTER — Ambulatory Visit: Payer: Medicare HMO | Admitting: Urology

## 2022-09-14 ENCOUNTER — Encounter: Payer: Self-pay | Admitting: Urology

## 2022-09-14 VITALS — BP 110/73 | HR 82 | Ht 74.0 in | Wt 205.0 lb

## 2022-09-14 DIAGNOSIS — N529 Male erectile dysfunction, unspecified: Secondary | ICD-10-CM | POA: Diagnosis not present

## 2022-09-14 DIAGNOSIS — R972 Elevated prostate specific antigen [PSA]: Secondary | ICD-10-CM

## 2022-09-14 MED ORDER — SILDENAFIL CITRATE 20 MG PO TABS: ORAL_TABLET | ORAL | 11 refills | Status: AC

## 2022-09-14 MED ORDER — SILDENAFIL CITRATE 20 MG PO TABS: ORAL_TABLET | ORAL | 11 refills | Status: DC

## 2022-09-14 NOTE — Progress Notes (Signed)
Haze Rushing Plume,acting as a scribe for Hollice Espy, MD.,have documented all relevant documentation on the behalf of Hollice Espy, MD,as directed by  Hollice Espy, MD while in the presence of Hollice Espy, MD.  09/14/2022 12:36 PM   Larry Richardson 1956-04-26 [id number]  Referring provider: Leone Haven, MD [address] [place],  [place] [address]  Chief Complaint  Patient presents with   Elevated PSA    HPI: 67 year-old male referred for evaluation of elevated/ rising PSA. He is known to me and last seen in 2022 for personal history of right renal mass. He underwent a radical right laparoscopic nephrectomy with surgical pathology. 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 He was referred to the [facility] sarcoma clinic for further treatment. He underwent adjuvant chemotherapy in the form of  doxorubicin and Ifosfamide of which he has completed 4 cycles. He is now on surveillance imaging. His most recent scan was in 03/2022 which was negative. He reports that he has repeat CT every 3 months.   He was referred back for rising PSA. His most recent PSA on 08/13/2022 was 3.14 but has been slowly rising up from 2.38 one year ago. His PSA has been as high as 2.4.   Today, he is reporting no urinary symptoms. He does report erectile dysfunction for which he has not tried medication for yet.     Component     Latest Ref Rng 06/30/2020 07/01/2021 06/30/2022 08/13/2022  PSA     0.10 - 4.00 ng/mL 2.29  2.38  3.29  3.14     PMH: Past Medical History:  Diagnosis Date   Cancer ([place]) 12/2020   renal cell cancer   Chickenpox    Colon polyps    Diverticulosis    Hemorrhoids    Had blood in his stool 12-13 years ago, had colonoscopy and was found to have hemorrhoids   Hyperlipidemia     Surgical History: Past Surgical History:  Procedure Laterality Date   ACHILLES TENDON REPAIR  Right 1980   COLONOSCOPY     LAPAROSCOPIC NEPHRECTOMY, HAND ASSISTED Right 01/02/2021   Procedure: HAND ASSISTED LAPAROSCOPIC NEPHRECTOMY;  Surgeon: Hollice Espy, MD;  Location: [facility] ORS;  Service: Urology;  Laterality: Right;    Home Medications:  Allergies as of 09/14/2022   No Known Allergies      Medication List        Accurate as of September 14, 2022 12:36 PM. If you have any questions, ask your nurse or doctor.          cetirizine 10 MG tablet Commonly known as: ZYRTEC Take 10 mg by mouth daily.   docusate sodium 100 MG capsule Commonly known as: COLACE Take 1 capsule (100 mg total) by mouth 2 (two) times daily.   rosuvastatin 20 MG tablet Commonly known as: CRESTOR TAKE 1 TABLET BY MOUTH EVERY DAY   sildenafil 20 MG tablet Commonly known as: REVATIO 1-3 pills as needed 1 hour prior to intercourse. Started by: Hollice Espy, MD        Allergies: No Known Allergies  Family History: Family History  Problem Relation Age of Onset   Breast cancer Mother 13   Breast cancer Other        Mom   Prostate cancer Other        Grandfather   Hyperlipidemia Other        Parent   Hypertension Other  Parent   Leukemia Other        Father    Social History:  reports that he has never smoked. He has never been exposed to tobacco smoke. He has never used smokeless tobacco. He reports current alcohol use of about 2.0 standard drinks of alcohol per week. He reports that he does not use drugs.   Physical Exam: BP 110/73   Pulse 82   Ht '6\' 2"'$  (1.88 m)   Wt 205 lb (93 kg)   BMI 26.32 kg/m   Constitutional:  Alert and oriented, No acute distress. HEENT: [place] AT, moist mucus membranes.  Trachea midline, no masses. GU: Mildly enlarged prostate. No nodularity.  Neurologic: Grossly intact, no focal deficits, moving all 4 extremities. Psychiatric: Normal mood and affect.  Assessment & Plan:    1. Elevated PSA - DRE today - Repeat PSA in 6 months. - We  discussed how the repeated CT scans would likely detect if there was an issue with the prostate. - We reviewed the implications of an elevated PSA and the uncertainty surrounding it. In general, a man's PSA increases with age and is produced by both normal and cancerous prostate tissue. Differential for elevated PSA is BPH, prostate cancer, infection, recent intercourse/ejaculation, prostate infarction, recent urethroscopic manipulation (foley placement/cystoscopy) and prostatitis. Management of an elevated PSA can include observation or prostate biopsy and we discussed this in detail. We discussed that indications for prostate biopsy are defined by age and race specific PSA cutoffs as well as a PSA velocity of 0.75/year. - If PSA has a downward trend, repeat 6 month PSA follow up with his PCP. If it is trending upwards, we will consider more specific imaging for the prostate versus biopsy if his numbers have doubled.   2. Erectile dysfunction - Could be attributed to recent chemotherapy. - Sildenafil 20 mg 1 hour prior to intercourse. Most effective on an empty stomach. - We discussed the pathophysiology of erectile dysfunction today along with possible contributing factors.  - In terms of PDE 5 inhibitors, we discussed contraindications for this medication as well as common side effects. Patient was counseled on optimal use. All of his questions were answered in detail.  Return in about 6 months (around 03/17/2023) for repeat PSA.   [place] [address] [place], [place] [address] [phone] '

## 2022-10-11 ENCOUNTER — Telehealth: Payer: Self-pay | Admitting: Family Medicine

## 2022-10-11 NOTE — Telephone Encounter (Signed)
Copied from [place] [id number]. Topic: Medicare AWV >> Oct 11, 2022 11:53 AM Lollie Marrow wrote: Reason for CRM: Called patient to schedule Medicare Annual Wellness Visit (AWV). Left message for patient to call back and schedule Medicare Annual Wellness Visit (AWV).  Last date of AWV: 10/06/2021  Please schedule an AWVS appointment at any time with [facility].  If any questions, please contact me at [phone].   Thank you,  [place] Direct dial  [phone]

## 2022-10-22 ENCOUNTER — Other Ambulatory Visit: Admit: 2022-10-22 | Discharge: 2022-10-22 | Payer: MEDICARE

## 2022-10-22 ENCOUNTER — Ambulatory Visit: Admit: 2022-10-22 | Discharge: 2022-10-22 | Payer: MEDICARE

## 2022-10-22 ENCOUNTER — Ambulatory Visit
Admit: 2022-10-22 | Discharge: 2022-10-22 | Payer: MEDICARE | Attending: Student in an Organized Health Care Education/Training Program | Primary: Student in an Organized Health Care Education/Training Program

## 2022-10-22 DIAGNOSIS — C499 Malignant neoplasm of connective and soft tissue, unspecified: Principal | ICD-10-CM

## 2022-10-22 DIAGNOSIS — C495 Malignant neoplasm of connective and soft tissue of pelvis: Principal | ICD-10-CM

## 2022-10-22 DIAGNOSIS — C480 Malignant neoplasm of retroperitoneum: Secondary | ICD-10-CM | POA: Diagnosis not present

## 2022-11-25 DIAGNOSIS — L820 Inflamed seborrheic keratosis: Secondary | ICD-10-CM | POA: Diagnosis not present

## 2022-12-17 ENCOUNTER — Ambulatory Visit: Admit: 2022-12-17 | Discharge: 2022-12-18 | Payer: MEDICARE

## 2022-12-17 DIAGNOSIS — Z905 Acquired absence of kidney: Principal | ICD-10-CM

## 2022-12-17 DIAGNOSIS — N1832 Stage 3b chronic kidney disease (CMS-HCC): Principal | ICD-10-CM

## 2022-12-31 ENCOUNTER — Other Ambulatory Visit: Payer: Self-pay | Admitting: Family Medicine

## 2022-12-31 DIAGNOSIS — E785 Hyperlipidemia, unspecified: Secondary | ICD-10-CM

## 2023-01-27 DIAGNOSIS — Z85828 Personal history of other malignant neoplasm of skin: Secondary | ICD-10-CM | POA: Diagnosis not present

## 2023-01-27 DIAGNOSIS — L814 Other melanin hyperpigmentation: Secondary | ICD-10-CM | POA: Diagnosis not present

## 2023-01-27 DIAGNOSIS — L821 Other seborrheic keratosis: Secondary | ICD-10-CM | POA: Diagnosis not present

## 2023-01-27 DIAGNOSIS — D2371 Other benign neoplasm of skin of right lower limb, including hip: Secondary | ICD-10-CM | POA: Diagnosis not present

## 2023-01-27 DIAGNOSIS — D485 Neoplasm of uncertain behavior of skin: Secondary | ICD-10-CM | POA: Diagnosis not present

## 2023-01-27 DIAGNOSIS — D225 Melanocytic nevi of trunk: Secondary | ICD-10-CM | POA: Diagnosis not present

## 2023-01-27 DIAGNOSIS — D1801 Hemangioma of skin and subcutaneous tissue: Secondary | ICD-10-CM | POA: Diagnosis not present

## 2023-01-27 DIAGNOSIS — D2372 Other benign neoplasm of skin of left lower limb, including hip: Secondary | ICD-10-CM | POA: Diagnosis not present

## 2023-02-01 ENCOUNTER — Ambulatory Visit
Admit: 2023-02-01 | Discharge: 2023-02-01 | Payer: MEDICARE | Attending: Student in an Organized Health Care Education/Training Program | Primary: Student in an Organized Health Care Education/Training Program

## 2023-02-01 ENCOUNTER — Ambulatory Visit: Admit: 2023-02-01 | Discharge: 2023-02-01 | Payer: MEDICARE

## 2023-02-01 DIAGNOSIS — Z905 Acquired absence of kidney: Principal | ICD-10-CM

## 2023-02-01 DIAGNOSIS — C499 Malignant neoplasm of connective and soft tissue, unspecified: Principal | ICD-10-CM

## 2023-02-01 DIAGNOSIS — C480 Malignant neoplasm of retroperitoneum: Principal | ICD-10-CM

## 2023-02-01 DIAGNOSIS — N1832 Stage 3b chronic kidney disease (CMS-HCC): Principal | ICD-10-CM

## 2023-02-01 DIAGNOSIS — R112 Nausea with vomiting, unspecified: Secondary | ICD-10-CM | POA: Diagnosis not present

## 2023-02-01 DIAGNOSIS — C414 Malignant neoplasm of pelvic bones, sacrum and coccyx: Secondary | ICD-10-CM | POA: Diagnosis not present

## 2023-02-01 DIAGNOSIS — C495 Malignant neoplasm of connective and soft tissue of pelvis: Secondary | ICD-10-CM | POA: Diagnosis not present

## 2023-03-17 ENCOUNTER — Other Ambulatory Visit: Payer: Medicare HMO

## 2023-03-17 DIAGNOSIS — R972 Elevated prostate specific antigen [PSA]: Secondary | ICD-10-CM

## 2023-03-18 LAB — PSA: Prostate Specific Ag, Serum: 2.8 ng/mL (ref 0.0–4.0)

## 2023-04-06 ENCOUNTER — Ambulatory Visit: Payer: Medicare HMO | Admitting: Emergency Medicine

## 2023-04-06 VITALS — Ht 74.0 in | Wt 210.0 lb

## 2023-04-06 DIAGNOSIS — Z0000 Encounter for general adult medical examination without abnormal findings: Secondary | ICD-10-CM | POA: Diagnosis not present

## 2023-04-06 NOTE — Patient Instructions (Addendum)
Larry Richardson , Thank you for taking time to come for your Medicare Wellness Visit. I appreciate your ongoing commitment to your health goals. Please review the following plan we discussed and let me know if I can assist you in the future.   Referrals/Orders/Follow-Ups/Clinician Recommendations: Get the flu, RSV and covid vaccines as discussed.  This is a list of the screening recommended for you and due dates:  Health Maintenance  Topic Date Due   COVID-19 Vaccine (7 - 2023-24 season) 03/13/2023   Flu Shot  10/10/2023*   Medicare Annual Wellness Visit  04/05/2024   Colon Cancer Screening  05/16/2025   DTaP/Tdap/Td vaccine (3 - Td or Tdap) 05/28/2025   Pneumonia Vaccine  Completed   Hepatitis C Screening  Completed   Zoster (Shingles) Vaccine  Completed   HPV Vaccine  Aged Out  *Topic was postponed. The date shown is not the original due date.    Advanced directives: (Copy Requested) Please bring a copy of your health care power of attorney and living will to the office to be added to your chart at your convenience.  Next Medicare Annual Wellness Visit scheduled for next year: Yes, 04/11/24 @ 8:15am

## 2023-04-06 NOTE — Progress Notes (Signed)
Subjective:   Larry Richardson is a 67 y.o. male who presents for Medicare Annual/Subsequent preventive examination.  Visit Complete: Virtual  I connected with  Larry Richardson on 04/06/23 by a audio enabled telemedicine application and verified that I am speaking with the correct person using two identifiers.  Patient Location: Home  Provider Location: Home Office  I discussed the limitations of evaluation and management by telemedicine. The patient expressed understanding and agreed to proceed.  Because this visit was a virtual/telehealth visit, some criteria may be missing or patient reported. Any vitals not documented were not able to be obtained and vitals that have been documented are patient reported.    Cardiac Risk Factors include: advanced age (>62men, >42 women);male gender;dyslipidemia     Objective:    Today's Vitals   04/06/23 0813  Weight: 210 lb (95.3 kg)  Height: 6\' 2"  (1.88 m)   Body mass index is 26.96 kg/m.     04/06/2023    8:23 AM 10/06/2021    9:36 AM 01/15/2021    5:08 PM 01/02/2021    5:12 PM 01/02/2021    6:25 AM 12/26/2020    3:23 PM  Advanced Directives  Does Patient Have a Medical Advance Directive? Yes Yes No No Yes Yes  Type of Estate agent of [place];Living will Healthcare Power of [place];Living will   Healthcare Power of [place];Living will Healthcare Power of [place];Living will  Does patient want to make changes to medical advance directive? No - Patient declined No - Patient declined  No - Patient declined No - Patient declined No - Patient declined  Copy of Healthcare Power of Attorney in Chart? No - copy requested No - copy requested   No - copy requested Yes - validated most recent copy scanned in chart (See row information)    Current Medications (verified) Outpatient Encounter Medications as of 04/06/2023  Medication Sig   cetirizine (ZYRTEC) 10 MG tablet Take 10 mg by mouth daily.   docusate sodium  (COLACE) 100 MG capsule Take 1 capsule (100 mg total) by mouth 2 (two) times daily.   rosuvastatin (CRESTOR) 20 MG tablet TAKE 1 TABLET BY MOUTH EVERY DAY   sildenafil (REVATIO) 20 MG tablet 1-3 pills as needed 1 hour prior to intercourse.   No facility-administered encounter medications on file as of 04/06/2023.    Allergies (verified) Patient has no known allergies.   History: Past Medical History:  Diagnosis Date   Cancer (HCC) 12/2020   renal cell cancer   Chickenpox    Colon polyps    Diverticulosis    Hemorrhoids    Had blood in his stool 12-13 years ago, had colonoscopy and was found to have hemorrhoids   Hyperlipidemia    Past Surgical History:  Procedure Laterality Date   ACHILLES TENDON REPAIR Right 1980   COLONOSCOPY     LAPAROSCOPIC NEPHRECTOMY, HAND ASSISTED Right 01/02/2021   Procedure: HAND ASSISTED LAPAROSCOPIC NEPHRECTOMY;  Surgeon: Vanna Scotland, MD;  Location: [facility] ORS;  Service: Urology;  Laterality: Right;   Family History  Problem Relation Age of Onset   Hypertension Mother    Hyperlipidemia Mother    Breast cancer Mother 71   Hypertension Father    Leukemia Father    Breast cancer Other        Mom   Prostate cancer Other        Grandfather   Hyperlipidemia Other        Parent   Hypertension Other  Parent   Leukemia Other        Father   Social History   Socioeconomic History   Marital status: Married    Spouse name: [name]   Number of children: 2   Years of education: Not on file   Highest education level: Not on file  Occupational History   Occupation: was a Production designer, 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 Feels safe in his home.   His wife goes to [place] during week to take care of kids.   She will be here to help out after surgery.   Social Determinants of Health   Financial Resource Strain: Low Risk  (04/06/2023)   Overall Financial Resource Strain (CARDIA)    Difficulty of Paying Living Expenses: Not hard at all  Food Insecurity: No Food Insecurity (04/06/2023)   Hunger Vital Sign    Worried About Running Out of Food in the Last Year: Never true    Ran Out of Food in the Last Year: Never true  Transportation Needs: No Transportation Needs (04/06/2023)   PRAPARE - Administrator, Civil Service (Medical): No    Lack of Transportation (Non-Medical): No  Physical Activity: Sufficiently Active (04/06/2023)   Exercise Vital Sign    Days of Exercise per Week: 5 days    Minutes of Exercise per Session: 60 min  Stress: No Stress Concern Present (04/06/2023)   [employer] of Occupational Health - Occupational Stress Questionnaire    Feeling of Stress : Not at all  Social Connections: Socially Integrated (04/06/2023)   Social Connection and Isolation Panel [NHANES]    Frequency of Communication with Friends and Family: More than three times a week    Frequency of Social Gatherings with Friends and Family: Once a week    Attends Religious Services: More than 4 times per year    Active Member of [employer] or Organizations: Yes    Attends Engineer, structural: More than 4 times per year    Marital Status: Married    Tobacco Counseling Counseling given: Not Answered   Clinical Intake:  Pre-visit preparation completed: Yes  Pain : No/denies pain     BMI - recorded: 26.96 Nutritional Status: BMI 25 -29 Overweight Nutritional Risks: None Diabetes: No  How often do you need to have someone help you when you read instructions, pamphlets, or other written materials from your doctor or pharmacy?: 1 -  Never  Interpreter Needed?: No  Information entered by :: Tora Kindred, CMA   Activities of Daily Living    04/06/2023    8:15 AM  In your present state of health, do you have any difficulty performing the following activities:  Hearing? 0  Vision? 0  Difficulty concentrating or making decisions? 0  Walking or climbing stairs? 0  Dressing or bathing? 0  Doing errands, shopping? 0  Preparing Food and eating ? N  Using the Toilet? N  In the past six months, have you accidently leaked urine? N  Do you  have problems with loss of bowel control? N  Managing your Medications? N  Managing your Finances? N  Housekeeping or managing your Housekeeping? N    Patient Care Team: Glori Luis, MD as PCP - General (Family Medicine)  Indicate any recent Medical Services you may have received from other than [facility] providers in the past year (date may be approximate).     Assessment:   This is a routine wellness examination for Larry Richardson.  Hearing/Vision screen Hearing Screening - Comments:: Denies hearing loss Vision Screening - Comments:: Gets routine eye exams   Goals Addressed               This Visit's Progress     Weight (lb) < 195 lb (88.5 kg) (pt-stated)   210 lb (95.3 kg)     Depression Screen    04/06/2023    8:21 AM 06/30/2022    9:00 AM 12/30/2021    8:29 AM 10/06/2021    9:35 AM 07/01/2021    8:34 AM 11/10/2020    1:26 PM 06/30/2020    2:51 PM  PHQ 2/9 Scores  PHQ - 2 Score 0 0 0 0 0 0 0  PHQ- 9 Score 0 0         Fall Risk    04/06/2023    8:24 AM 06/30/2022    9:00 AM 12/30/2021    8:29 AM 10/06/2021    9:37 AM 07/01/2021    8:33 AM  Fall Risk   Falls in the past year? 0 0 0 0 0  Number falls in past yr: 0 0 0 0 0  Injury with Fall? 0 0 0  0  Risk for fall due to : No Fall Risks No Fall Risks No Fall Risks  No Fall Risks  Follow up Falls prevention discussed Falls evaluation completed Falls evaluation completed Falls evaluation completed Falls  evaluation completed    MEDICARE RISK AT HOME: Medicare Risk at Home Any stairs in or around the home?: Yes If so, are there any without handrails?: No Home free of loose throw rugs in walkways, pet beds, electrical cords, etc?: Yes Adequate lighting in your home to reduce risk of falls?: Yes Life alert?: No Use of a cane, walker or w/c?: No Grab bars in the bathroom?: No Shower chair or bench in shower?: Yes Elevated toilet seat or a handicapped toilet?: No  TIMED UP AND GO:  Was the test performed?  No    Cognitive Function:        04/06/2023    8:25 AM  6CIT Screen  What Year? 0 points  What month? 0 points  What time? 0 points  Count back from 20 0 points  Months in reverse 0 points  Repeat phrase 0 points  Total Score 0 points    Immunizations Immunization History  Administered Date(s) Administered   Fluad Quad(high Dose 65+) 04/25/2022   Influenza, Seasonal, Injecte, Preservative Fre 04/17/2018   Influenza-Unspecified 05/12/2016, 03/13/2020, 04/13/2021   Moderna Covid-19 Vaccine Bivalent Booster 13yrs & up 04/15/2021   Moderna Sars-Covid-2 Vaccination 09/20/2019, 10/18/2019, 06/01/2020, 12/07/2020   Pneumococcal Conjugate-13 06/30/2020   Pneumococcal Polysaccharide-23 09/18/2021   Td 07/13/1999   Tdap 05/29/2015   Unspecified SARS-COV-2 Vaccination 05/10/2022   Zoster Recombinant(Shingrix) 09/10/2018, 02/09/2019    TDAP status: Up to date  Flu Vaccine status: Due, Education has been provided regarding the importance of this vaccine. Advised may receive this vaccine at local pharmacy or Health Dept. Aware to provide a  copy of the vaccination record if obtained from local pharmacy or Health Dept. Verbalized acceptance and understanding.  Pneumococcal vaccine status: Up to date  Covid-19 vaccine status: Information provided on how to obtain vaccines.   Qualifies for Shingles Vaccine? Yes   Zostavax completed No   Shingrix Completed?: Yes  Screening  Tests Health Maintenance  Topic Date Due   INFLUENZA VACCINE  02/10/2023   COVID-19 Vaccine (7 - 2023-24 season) 03/13/2023   Medicare Annual Wellness (AWV)  04/05/2024   Colonoscopy  05/16/2025   DTaP/Tdap/Td (3 - Td or Tdap) 05/28/2025   Pneumonia Vaccine 69+ Years old  Completed   Hepatitis C Screening  Completed   Zoster Vaccines- Shingrix  Completed   HPV VACCINES  Aged Out    Health Maintenance  Health Maintenance Due  Topic Date Due   INFLUENZA VACCINE  02/10/2023   COVID-19 Vaccine (7 - 2023-24 season) 03/13/2023    Colorectal cancer screening: Type of screening: Colonoscopy. Completed 05/16/20. Repeat every 5 years  Lung Cancer Screening: (Low Dose CT Chest recommended if Age 11-80 years, 20 pack-year currently smoking OR have quit w/in 15years.) does not qualify.   Lung Cancer Screening Referral: n/a  Additional Screening:  Hepatitis C Screening: does not qualify; Completed 05/1715  Vision Screening: Recommended annual ophthalmology exams for early detection of glaucoma and other disorders of the eye.  Dental Screening: Recommended annual dental exams for proper oral hygiene   Community Resource Referral / Chronic Care Management: CRR required this visit?  No   CCM required this visit?  No     Plan:     I have personally reviewed and noted the following in the patient's chart:   Medical and social history Use of alcohol, tobacco or illicit drugs  Current medications and supplements including opioid prescriptions. Patient is not currently taking opioid prescriptions. Functional ability and status Nutritional status Physical activity Advanced directives List of other physicians Hospitalizations, surgeries, and ER visits in previous 12 months Vitals Screenings to include cognitive, depression, and falls Referrals and appointments  In addition, I have reviewed and discussed with patient certain preventive protocols, quality metrics, and best practice  recommendations. A written personalized care plan for preventive services as well as general preventive health recommendations were provided to patient.     Tora Kindred, CMA   04/06/2023   After Visit Summary: (MyChart) Due to this being a telephonic visit, the after visit summary with patients personalized plan was offered to patient via MyChart   Nurse Notes:  Will get flu, RSV and covid vaccines.

## 2023-04-22 DIAGNOSIS — R69 Illness, unspecified: Secondary | ICD-10-CM | POA: Diagnosis not present

## 2023-04-26 ENCOUNTER — Ambulatory Visit
Admit: 2023-04-26 | Discharge: 2023-04-26 | Attending: Student in an Organized Health Care Education/Training Program | Primary: Student in an Organized Health Care Education/Training Program

## 2023-04-26 ENCOUNTER — Ambulatory Visit: Admit: 2023-04-26 | Discharge: 2023-04-26

## 2023-04-26 DIAGNOSIS — C499 Malignant neoplasm of connective and soft tissue, unspecified: Principal | ICD-10-CM

## 2023-04-26 DIAGNOSIS — C480 Malignant neoplasm of retroperitoneum: Principal | ICD-10-CM

## 2023-04-26 DIAGNOSIS — N1832 Stage 3b chronic kidney disease (CMS-HCC): Principal | ICD-10-CM

## 2023-04-26 DIAGNOSIS — R918 Other nonspecific abnormal finding of lung field: Secondary | ICD-10-CM | POA: Diagnosis not present

## 2023-04-26 DIAGNOSIS — N62 Hypertrophy of breast: Secondary | ICD-10-CM | POA: Diagnosis not present

## 2023-04-26 DIAGNOSIS — C641 Malignant neoplasm of right kidney, except renal pelvis: Secondary | ICD-10-CM | POA: Diagnosis not present

## 2023-04-26 DIAGNOSIS — N189 Chronic kidney disease, unspecified: Secondary | ICD-10-CM | POA: Diagnosis not present

## 2023-04-26 DIAGNOSIS — K429 Umbilical hernia without obstruction or gangrene: Secondary | ICD-10-CM | POA: Diagnosis not present

## 2023-04-26 DIAGNOSIS — K449 Diaphragmatic hernia without obstruction or gangrene: Secondary | ICD-10-CM | POA: Diagnosis not present

## 2023-06-17 ENCOUNTER — Ambulatory Visit: Admit: 2023-06-17 | Discharge: 2023-06-18

## 2023-06-17 ENCOUNTER — Other Ambulatory Visit: Payer: Self-pay | Admitting: Family Medicine

## 2023-06-17 DIAGNOSIS — N1832 Stage 3b chronic kidney disease (CMS-HCC): Principal | ICD-10-CM

## 2023-06-17 DIAGNOSIS — Z905 Acquired absence of kidney: Principal | ICD-10-CM

## 2023-06-17 DIAGNOSIS — E785 Hyperlipidemia, unspecified: Secondary | ICD-10-CM

## 2023-06-29 ENCOUNTER — Telehealth: Payer: Self-pay | Admitting: *Deleted

## 2023-06-29 DIAGNOSIS — E663 Overweight: Secondary | ICD-10-CM

## 2023-06-29 DIAGNOSIS — E7800 Pure hypercholesterolemia, unspecified: Secondary | ICD-10-CM

## 2023-06-29 NOTE — Telephone Encounter (Signed)
Ordered

## 2023-06-29 NOTE — Telephone Encounter (Signed)
Please place future lab orders for lab appt on 06/30/23

## 2023-06-30 ENCOUNTER — Other Ambulatory Visit (INDEPENDENT_AMBULATORY_CARE_PROVIDER_SITE_OTHER): Payer: Medicare HMO

## 2023-06-30 DIAGNOSIS — E663 Overweight: Secondary | ICD-10-CM

## 2023-06-30 DIAGNOSIS — E7800 Pure hypercholesterolemia, unspecified: Secondary | ICD-10-CM | POA: Diagnosis not present

## 2023-06-30 LAB — COMPREHENSIVE METABOLIC PANEL
ALT: 35 U/L (ref 0–53)
AST: 29 U/L (ref 0–37)
Albumin: 4.3 g/dL (ref 3.5–5.2)
Alkaline Phosphatase: 51 U/L (ref 39–117)
BUN: 23 mg/dL (ref 6–23)
CO2: 27 meq/L (ref 19–32)
Calcium: 9 mg/dL (ref 8.4–10.5)
Chloride: 108 meq/L (ref 96–112)
Creatinine, Ser: 2.11 mg/dL — ABNORMAL HIGH (ref 0.40–1.50)
GFR: 31.58 mL/min — ABNORMAL LOW (ref >60.00)
Glucose, Bld: 106 mg/dL — ABNORMAL HIGH (ref 70–99)
Potassium: 5.2 meq/L — ABNORMAL HIGH (ref 3.5–5.1)
Sodium: 141 meq/L (ref 135–145)
Total Bilirubin: 0.9 mg/dL (ref 0.2–1.2)
Total Protein: 6.4 g/dL (ref 6.0–8.3)

## 2023-06-30 LAB — HEMOGLOBIN A1C: Hgb A1c MFr Bld: 5.8 % (ref 4.6–6.5)

## 2023-06-30 LAB — LIPID PANEL
Cholesterol: 88 mg/dL (ref 0–200)
HDL: 29.6 mg/dL — ABNORMAL LOW (ref >39.00)
LDL Cholesterol: 46 mg/dL (ref 0–99)
NonHDL: 58.43
Total CHOL/HDL Ratio: 3
Triglycerides: 61 mg/dL (ref 0.0–149.0)
VLDL: 12.2 mg/dL (ref 0.0–40.0)

## 2023-07-01 ENCOUNTER — Telehealth: Payer: Self-pay

## 2023-07-01 NOTE — Telephone Encounter (Signed)
 Left message to call the office back regarding the lab results below.

## 2023-07-01 NOTE — Telephone Encounter (Signed)
-----   Message from Marikay Alar sent at 07/01/2023 12:38 PM EST ----- Please let the patient know that his potassium is minimally elevated.  Has he been taking a potassium supplement?  His kidney function is generally stable.  His A1c is barely into the prediabetic range.  He needs to remain active and eat a healthy diet to help prevent this from worsening.  His cholesterol is well-controlled.

## 2023-07-04 ENCOUNTER — Encounter: Payer: Medicare HMO | Admitting: Family Medicine

## 2023-07-05 ENCOUNTER — Other Ambulatory Visit: Payer: Self-pay | Admitting: Family Medicine

## 2023-07-05 DIAGNOSIS — E875 Hyperkalemia: Secondary | ICD-10-CM

## 2023-07-22 ENCOUNTER — Encounter: Payer: Medicare HMO | Admitting: Family Medicine

## 2023-07-29 DIAGNOSIS — C499 Malignant neoplasm of connective and soft tissue, unspecified: Principal | ICD-10-CM

## 2023-08-09 ENCOUNTER — Ambulatory Visit: Admit: 2023-08-09 | Discharge: 2023-08-09 | Payer: MEDICARE

## 2023-08-09 ENCOUNTER — Inpatient Hospital Stay: Admit: 2023-08-09 | Discharge: 2023-08-09 | Payer: MEDICARE

## 2023-08-09 ENCOUNTER — Ambulatory Visit
Admit: 2023-08-09 | Discharge: 2023-08-09 | Payer: MEDICARE | Attending: Student in an Organized Health Care Education/Training Program | Primary: Student in an Organized Health Care Education/Training Program

## 2023-08-09 DIAGNOSIS — N1832 Stage 3b chronic kidney disease (CMS-HCC): Principal | ICD-10-CM

## 2023-08-09 DIAGNOSIS — C499 Malignant neoplasm of connective and soft tissue, unspecified: Principal | ICD-10-CM

## 2023-08-09 DIAGNOSIS — C480 Malignant neoplasm of retroperitoneum: Principal | ICD-10-CM

## 2023-08-09 DIAGNOSIS — Z905 Acquired absence of kidney: Principal | ICD-10-CM

## 2023-08-16 DIAGNOSIS — C499 Malignant neoplasm of connective and soft tissue, unspecified: Principal | ICD-10-CM

## 2023-08-18 ENCOUNTER — Ambulatory Visit (INDEPENDENT_AMBULATORY_CARE_PROVIDER_SITE_OTHER): Payer: Medicare Other | Admitting: Family Medicine

## 2023-08-18 ENCOUNTER — Encounter: Payer: Self-pay | Admitting: Family Medicine

## 2023-08-18 VITALS — BP 110/60 | HR 69 | Temp 98.3°F | Resp 18 | Ht 74.0 in | Wt 214.5 lb

## 2023-08-18 DIAGNOSIS — C499 Malignant neoplasm of connective and soft tissue, unspecified: Secondary | ICD-10-CM | POA: Diagnosis not present

## 2023-08-18 DIAGNOSIS — E785 Hyperlipidemia, unspecified: Secondary | ICD-10-CM | POA: Insufficient documentation

## 2023-08-18 DIAGNOSIS — N1832 Chronic kidney disease, stage 3b: Secondary | ICD-10-CM

## 2023-08-18 DIAGNOSIS — Z0000 Encounter for general adult medical examination without abnormal findings: Secondary | ICD-10-CM | POA: Insufficient documentation

## 2023-08-18 DIAGNOSIS — Z23 Encounter for immunization: Secondary | ICD-10-CM | POA: Diagnosis not present

## 2023-08-18 MED ORDER — ROSUVASTATIN CALCIUM 10 MG PO TABS: 10.0000 mg | ORAL_TABLET | Freq: Every day | ORAL | 3 refills | Status: AC

## 2023-08-18 MED ORDER — ROSUVASTATIN CALCIUM 20 MG PO TABS: 20.0000 mg | ORAL_TABLET | Freq: Every day | ORAL | 1 refills | Status: DC

## 2023-08-18 NOTE — Assessment & Plan Note (Signed)
 One kidney post nephrectomy. Recent labs show high creatinine andGFR 33. Regular follow-ups with nephrologist. -Continue current follow-up plan with nephrologist.

## 2023-08-18 NOTE — Patient Instructions (Addendum)
 It was a pleasure meeting you today. Thank you for allowing me to take part in your health care.  Our goals for today as we discussed include:  Please schedule a transfer of care appointment  Received Pneumonia 20 vaccine today. No further vaccine required  Decrease Crestor  to 10 mg daily  This is a list of the screening recommended for you and due dates:  Health Maintenance  Topic Date Due   COVID-19 Vaccine (8 - 2024-25 season) 08/17/2023   Medicare Annual Wellness Visit  04/05/2024   Colon Cancer Screening  05/16/2025   DTaP/Tdap/Td vaccine (3 - Td or Tdap) 05/28/2025   Pneumonia Vaccine  Completed   Flu Shot  Completed   Hepatitis C Screening  Completed   Zoster (Shingles) Vaccine  Completed   HPV Vaccine  Aged Out      If you have any questions or concerns, please do not hesitate to call the office at [phone].  I look forward to our next visit and until then take care and stay safe.  Regards,   Glenys Ferrari, MD   [facility]

## 2023-08-18 NOTE — Assessment & Plan Note (Signed)
-  Administer Pneumonia 20 vaccine today. -Next Tetanus vaccine due in 2026. -Next colonoscopy due in 2026. -Annual labs up to date

## 2023-08-18 NOTE — Progress Notes (Signed)
 SUBJECTIVE:   Chief Complaint  Patient presents with   Annual Exam   HPI Presents for annual physical   Discussed the use of AI scribe software for clinical note transcription with the patient, who gave verbal consent to proceed.  History of Present Illness Larry Richardson is a 68 year old male with cancer and chronic kidney disease who presents for an annual physical exam and transfer of care. He was referred by Dr. Maribeth for transfer of care as Dr. Maribeth is leaving the practice.  He had blood work done in December, which showed elevated potassium levels. Recent blood work from January 28th at [facility] showed potassium levels at 4.7, which is within normal range.  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 This was discovered after episodes of vomiting and dry heaving, leading to a CT scan that revealed a large, aggressive tumor. His right kidney was removed, and he underwent inpatient chemotherapy every three weeks at [facility]. He has been cancer-free for almost three years and continues to see his oncologist every three months.  He has chronic kidney disease, stage 3, with a history of high creatinine and low GFR due to having only one kidney and previous chemotherapy. He is not on any kidney-specific medication but is monitored by a nephrologist, Dr. Damien Chihuahua, at [facility].  He is currently taking Crestor  20 mg for cholesterol, which was prescribed before his cancer diagnosis. He has been on this medication for at least four to five years. His cholesterol levels have been stable. He also takes Zyrtec, Colace, and sildenafil  (Revatio ) as needed.  He has received all his vaccinations, including the RSV vaccine, and is considering the new pneumonia vaccine due to upcoming travel. His last PSA was checked in July by his urologist, Dr. Renaee, and he has no history of prostate cancer. No high blood pressure, no diabetes, no  current symptoms related to prostate issues.      PERTINENT PMH / PSH: As above  OBJECTIVE:  BP 110/60   Pulse 69   Temp 98.3 F (36.8 C)   Resp 18   Ht 6' 2 (1.88 m)   Wt 214 lb 8 oz (97.3 kg)   SpO2 100%   BMI 27.54 kg/m    Physical Exam Vitals reviewed.  HENT:     Head: Normocephalic.     Right Ear: Tympanic membrane, ear canal and external ear normal.     Left Ear: Tympanic membrane, ear canal and external ear normal.     Nose: Nose normal.     Mouth/Throat:     Mouth: Mucous membranes are moist.  Eyes:     Conjunctiva/sclera: Conjunctivae normal.     Pupils: Pupils are equal, round, and reactive to light.  Neck:     Thyroid : No thyromegaly or thyroid  tenderness.     Vascular: No carotid bruit.  Cardiovascular:     Rate and Rhythm: Normal rate and regular rhythm.     Pulses: Normal pulses.     Heart sounds: Normal heart sounds.  Pulmonary:     Effort: Pulmonary effort is normal.     Breath sounds: Normal breath sounds.  Abdominal:     General: Abdomen is flat. Bowel sounds are normal.     Palpations: Abdomen is soft.  Musculoskeletal:        General: Normal range of motion.     Cervical back: Normal range of motion and neck supple.  Right lower leg: No edema.     Left lower leg: No edema.  Lymphadenopathy:     Cervical: No cervical adenopathy.  Neurological:     Mental Status: He is alert.  Psychiatric:        Mood and Affect: Mood normal.        Behavior: Behavior normal.        Thought Content: Thought content normal.        Judgment: Judgment normal.           08/18/2023    8:54 AM 04/06/2023    8:21 AM 06/30/2022    9:00 AM 12/30/2021    8:29 AM 10/06/2021    9:35 AM  Depression screen PHQ 2/9  Decreased Interest 0 0 0 0 0  Down, Depressed, Hopeless 0 0 0 0 0  PHQ - 2 Score 0 0 0 0 0  Altered sleeping 0 0 0    Tired, decreased energy 0 0 0    Change in appetite 0 0 0    Feeling bad or failure about yourself  0 0 0    Trouble  concentrating 0 0 0    Moving slowly or fidgety/restless 0 0 0    Suicidal thoughts 0 0 0    PHQ-9 Score 0 0 0    Difficult doing work/chores Not difficult at all Not difficult at all Not difficult at all        08/18/2023    8:54 AM 06/30/2022    9:00 AM  GAD 7 : Generalized Anxiety Score  Nervous, Anxious, on Edge 0 0  Control/stop worrying 0 0  Worry too much - different things 0 0  Trouble relaxing 0 0  Restless 0 0  Easily annoyed or irritable 0 0  Afraid - awful might happen 0 0  Total GAD 7 Score 0 0  Anxiety Difficulty Not difficult at all Not difficult at all    ASSESSMENT/PLAN:  Annual physical exam Assessment & Plan: -Administer Pneumonia 20 vaccine today. -Next Tetanus vaccine due in 2026. -Next colonoscopy due in 2026. -Annual labs up to date   Hyperlipidemia, unspecified hyperlipidemia type Assessment & Plan: On Crestor  20mg . Recent LDL below goal. -Decrease Crestor  10mg  daily, GFR 33  Orders: -     Rosuvastatin  Calcium ; Take 1 tablet (10 mg total) by mouth daily.  Dispense: 90 tablet; Refill: 3  Encounter for immunization -     Pneumococcal conjugate vaccine 20-valent  Liposarcoma (HCC) Assessment & Plan: History of Dedifferentiated Liposarcoma In remission for almost three years. Regular follow-ups with oncologist every three months. -Continue current follow-up plan with oncologist.   Stage 3b chronic kidney disease [facility]) Assessment & Plan: One kidney post nephrectomy. Recent labs show high creatinine andGFR 33. Regular follow-ups with nephrologist. -Continue current follow-up plan with nephrologist.      PDMP reviewed  Return if symptoms worsen or fail to improve, for PCP.  Glenys Ferrari, MD

## 2023-08-18 NOTE — Assessment & Plan Note (Signed)
 History of Dedifferentiated Liposarcoma In remission for almost three years. Regular follow-ups with oncologist every three months. -Continue current follow-up plan with oncologist.

## 2023-08-18 NOTE — Assessment & Plan Note (Signed)
 On Crestor  20mg . Recent LDL below goal. -Decrease Crestor  10mg  daily, GFR 33

## 2023-08-31 DIAGNOSIS — H43813 Vitreous degeneration, bilateral: Secondary | ICD-10-CM | POA: Diagnosis not present

## 2023-08-31 DIAGNOSIS — Z0100 Encounter for examination of eyes and vision without abnormal findings: Secondary | ICD-10-CM | POA: Diagnosis not present

## 2023-08-31 DIAGNOSIS — H35379 Puckering of macula, unspecified eye: Secondary | ICD-10-CM | POA: Diagnosis not present

## 2023-08-31 DIAGNOSIS — H2511 Age-related nuclear cataract, right eye: Secondary | ICD-10-CM | POA: Diagnosis not present

## 2023-09-19 ENCOUNTER — Telehealth: Payer: Self-pay | Admitting: Family Medicine

## 2023-09-19 NOTE — Telephone Encounter (Signed)
 Called pt to call back and get appointment for a TOC slot due to provider Dana Allan will be leaving our office.

## 2023-09-28 ENCOUNTER — Ambulatory Visit (INDEPENDENT_AMBULATORY_CARE_PROVIDER_SITE_OTHER): Admitting: Family

## 2023-09-28 ENCOUNTER — Encounter: Payer: Self-pay | Admitting: Family

## 2023-09-28 VITALS — BP 128/80 | HR 62 | Temp 97.7°F | Ht 74.0 in | Wt 215.4 lb

## 2023-09-28 DIAGNOSIS — C499 Malignant neoplasm of connective and soft tissue, unspecified: Secondary | ICD-10-CM

## 2023-09-28 DIAGNOSIS — E785 Hyperlipidemia, unspecified: Secondary | ICD-10-CM

## 2023-09-28 DIAGNOSIS — Z125 Encounter for screening for malignant neoplasm of prostate: Secondary | ICD-10-CM

## 2023-09-28 LAB — LIPID PANEL
Cholesterol: 109 mg/dL (ref 0–200)
HDL: 32.5 mg/dL — ABNORMAL LOW (ref >39.00)
LDL Cholesterol: 59 mg/dL (ref 0–99)
NonHDL: 76.14
Total CHOL/HDL Ratio: 3
Triglycerides: 88 mg/dL (ref 0.0–149.0)
VLDL: 17.6 mg/dL (ref 0.0–40.0)

## 2023-09-28 NOTE — Progress Notes (Signed)
 Assessment & Plan:  Screening for prostate cancer -     PSA  Hyperlipidemia, unspecified hyperlipidemia type -     Lipid panel  Liposarcoma [facility]) Assessment & Plan: Dx 2022. In remission at this time.  Following closely with [facility] oncology, Dr. Dorna Mai.  Repeat CT A/P every 3 months, labs obtained by oncology. Will follow.       Return precautions given.   Risks, benefits, and alternatives of the medications and treatment plan prescribed today were discussed, and patient expressed understanding.   Education regarding symptom management and diagnosis given to patient on AVS either electronically or printed.  No follow-ups on file.  Rennie Plowman, FNP  Subjective:    Patient ID: Larry Richardson, male    DOB: Apr 20, 1956, 68 y.o.   MRN: [id number]  CC: Larry Richardson is a 68 y.o. male who presents today for follow up, TOC  HPI: Accompanied by wife  He goes to the [facility] 4 days per week, weights and elliptical.   Feels well today.  No new complaints.  He he would like cholesterol panel, PSA repeated today.       Decreased crestor from 20mg  to 10mg  2 mos ago  Follows with [facility] oncology Dr Dorna Mai  for dedifferentiated liposarcoma retroperitoneum dx 2022.  For surveillance , he repeat CT a/p every 3 months  He reports unusual episodic vomiting over the course of year leading up to diagnosis CT a/p dx 12/2020.   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 status postchemotherapy  Follows with [facility] nephrology, Dr Cherly Hensen  Denies nocturia, urinary frequency  Previously seen by Dr Apolinar Junes.    Allergies: Patient has no known allergies. Current Outpatient Medications on File Prior to Visit  Medication Sig Dispense Refill   cetirizine (ZYRTEC) 10 MG tablet Take 10 mg by mouth daily.     docusate sodium (COLACE) 100 MG capsule Take 1 capsule (100 mg total) by mouth 2 (two) times daily. 60 capsule 0   rosuvastatin (CRESTOR) 10 MG tablet Take 1  tablet (10 mg total) by mouth daily. 90 tablet 3   sildenafil (REVATIO) 20 MG tablet 1-3 pills as needed 1 hour prior to intercourse. 30 tablet 11   No current facility-administered medications on file prior to visit.    Review of Systems  Constitutional:  Negative for chills and fever.  Respiratory:  Negative for cough.   Cardiovascular:  Negative for chest pain and palpitations.  Gastrointestinal:  Negative for nausea and vomiting.      Objective:    BP 128/80   Pulse 62   Temp 97.7 F (36.5 C) (Oral)   Ht 6\' 2"  (1.88 m)   Wt 215 lb 6.4 oz (97.7 kg)   SpO2 99%   BMI 27.66 kg/m  BP Readings from Last 3 Encounters:  09/28/23 128/80  08/18/23 110/60  09/14/22 110/73   Wt Readings from Last 3 Encounters:  09/28/23 215 lb 6.4 oz (97.7 kg)  08/18/23 214 lb 8 oz (97.3 kg)  04/06/23 210 lb (95.3 kg)    Physical Exam Vitals reviewed.  Constitutional:      Appearance: He is well-developed.  Cardiovascular:     Rate and Rhythm: Regular rhythm.     Heart sounds: Normal heart sounds.  Pulmonary:     Effort: Pulmonary effort is normal. No respiratory distress.     Breath sounds: Normal breath sounds. No wheezing, rhonchi or rales.  Skin:    General: Skin is warm  and dry.  Neurological:     Mental Status: He is alert.  Psychiatric:        Speech: Speech normal.        Behavior: Behavior normal.

## 2023-09-28 NOTE — Assessment & Plan Note (Signed)
 Dx 2022. In remission at this time.  Following closely with [facility] oncology, Dr. Dorna Mai.  Repeat CT A/P every 3 months, labs obtained by oncology. Will follow.

## 2023-09-29 ENCOUNTER — Encounter: Payer: Self-pay | Admitting: Family

## 2023-09-29 LAB — PSA: PSA: 2.63 ng/mL (ref <=4.00)

## 2023-11-22 ENCOUNTER — Ambulatory Visit: Admit: 2023-11-22 | Discharge: 2023-11-22 | Payer: Medicare (Managed Care)

## 2023-11-22 ENCOUNTER — Inpatient Hospital Stay: Admit: 2023-11-22 | Discharge: 2023-11-22 | Payer: Medicare (Managed Care)

## 2023-11-22 ENCOUNTER — Ambulatory Visit
Admit: 2023-11-22 | Discharge: 2023-11-22 | Payer: Medicare (Managed Care) | Attending: Student in an Organized Health Care Education/Training Program | Primary: Student in an Organized Health Care Education/Training Program

## 2023-11-22 DIAGNOSIS — C499 Malignant neoplasm of connective and soft tissue, unspecified: Principal | ICD-10-CM

## 2023-11-22 DIAGNOSIS — C480 Malignant neoplasm of retroperitoneum: Principal | ICD-10-CM

## 2023-11-22 DIAGNOSIS — K449 Diaphragmatic hernia without obstruction or gangrene: Secondary | ICD-10-CM | POA: Diagnosis not present

## 2023-11-22 DIAGNOSIS — J9811 Atelectasis: Secondary | ICD-10-CM | POA: Diagnosis not present

## 2023-11-22 DIAGNOSIS — E785 Hyperlipidemia, unspecified: Secondary | ICD-10-CM | POA: Diagnosis not present

## 2023-11-22 DIAGNOSIS — N62 Hypertrophy of breast: Secondary | ICD-10-CM | POA: Diagnosis not present

## 2023-11-22 DIAGNOSIS — N189 Chronic kidney disease, unspecified: Secondary | ICD-10-CM | POA: Diagnosis not present

## 2023-11-22 DIAGNOSIS — I517 Cardiomegaly: Secondary | ICD-10-CM | POA: Diagnosis not present

## 2023-11-28 DIAGNOSIS — C499 Malignant neoplasm of connective and soft tissue, unspecified: Principal | ICD-10-CM

## 2023-11-28 DIAGNOSIS — C480 Malignant neoplasm of retroperitoneum: Principal | ICD-10-CM

## 2024-01-09 DIAGNOSIS — K080 Exfoliation of teeth due to systemic causes: Secondary | ICD-10-CM | POA: Diagnosis not present

## 2024-01-10 ENCOUNTER — Encounter: Payer: Medicare Other | Admitting: Family Medicine

## 2024-03-14 DIAGNOSIS — Z905 Acquired absence of kidney: Principal | ICD-10-CM

## 2024-03-14 DIAGNOSIS — N1832 Stage 3b chronic kidney disease (CMS-HCC): Principal | ICD-10-CM

## 2024-03-23 ENCOUNTER — Ambulatory Visit: Admit: 2024-03-23 | Discharge: 2024-03-24 | Payer: Medicare (Managed Care)

## 2024-03-23 DIAGNOSIS — N1832 Stage 3b chronic kidney disease (CMS-HCC): Principal | ICD-10-CM

## 2024-03-23 DIAGNOSIS — Z905 Acquired absence of kidney: Principal | ICD-10-CM

## 2024-03-30 ENCOUNTER — Ambulatory Visit: Admit: 2024-03-30 | Discharge: 2024-03-31 | Payer: Medicare (Managed Care)

## 2024-03-30 DIAGNOSIS — N1832 Stage 3b chronic kidney disease (CMS-HCC): Principal | ICD-10-CM

## 2024-03-30 DIAGNOSIS — Z905 Acquired absence of kidney: Principal | ICD-10-CM

## 2024-04-05 DIAGNOSIS — Z85828 Personal history of other malignant neoplasm of skin: Secondary | ICD-10-CM | POA: Diagnosis not present

## 2024-04-05 DIAGNOSIS — D225 Melanocytic nevi of trunk: Secondary | ICD-10-CM | POA: Diagnosis not present

## 2024-04-05 DIAGNOSIS — L820 Inflamed seborrheic keratosis: Secondary | ICD-10-CM | POA: Diagnosis not present

## 2024-04-05 DIAGNOSIS — D2261 Melanocytic nevi of right upper limb, including shoulder: Secondary | ICD-10-CM | POA: Diagnosis not present

## 2024-04-05 DIAGNOSIS — D2262 Melanocytic nevi of left upper limb, including shoulder: Secondary | ICD-10-CM | POA: Diagnosis not present

## 2024-04-13 ENCOUNTER — Ambulatory Visit

## 2024-05-07 ENCOUNTER — Ambulatory Visit

## 2024-05-11 ENCOUNTER — Ambulatory Visit

## 2024-05-11 VITALS — Ht 74.0 in | Wt 210.0 lb

## 2024-05-11 DIAGNOSIS — Z0000 Encounter for general adult medical examination without abnormal findings: Secondary | ICD-10-CM

## 2024-05-11 NOTE — Patient Instructions (Signed)
 [name],  Thank you for taking the time for your Medicare Wellness Visit. I appreciate your continued commitment to your health goals. Please review the care plan we discussed, and feel free to reach out if I can assist you further.  Medicare recommends these wellness visits once per year to help you and your care team stay ahead of potential health issues. These visits are designed to focus on prevention, allowing your provider to concentrate on managing your acute and chronic conditions during your regular appointments.  Please note that Annual Wellness Visits do not include a physical exam. Some assessments may be limited, especially if the visit was conducted virtually. If needed, we may recommend a separate in-person follow-up with your provider.  Ongoing Care Seeing your primary care provider every 3 to 6 months helps us  monitor your health and provide consistent, personalized care.  Remember to keep your vaccines up to date.   Referrals If a referral was made during today's visit and you haven't received any updates within two weeks, please contact the referred provider directly to check on the status.  Recommended Screenings:  Health Maintenance  Topic Date Due   COVID-19 Vaccine (9 - Moderna risk 2025-26 season) 10/30/2024   Medicare Annual Wellness Visit  05/11/2025   Colon Cancer Screening  05/16/2025   DTaP/Tdap/Td vaccine (3 - Td or Tdap) 05/28/2025   Pneumococcal Vaccine for age over 62  Completed   Flu Shot  Completed   Hepatitis C Screening  Completed   Zoster (Shingles) Vaccine  Completed   Meningitis B Vaccine  Aged Out   Breast Cancer Screening  Discontinued       05/11/2024   11:45 AM  Advanced Directives  Does Patient Have a Medical Advance Directive? Yes  Type of Estate Agent of [place];Living will  Copy of Healthcare Power of Attorney in Chart? No - copy requested   Advance Care Planning is important because it: Ensures you  receive medical care that aligns with your values, goals, and preferences. Provides guidance to your family and loved ones, reducing the emotional burden of decision-making during critical moments.  Vision: Annual vision screenings are recommended for early detection of glaucoma, cataracts, and diabetic retinopathy. These exams can also reveal signs of chronic conditions such as diabetes and high blood pressure.  Dental: Annual dental screenings help detect early signs of oral cancer, gum disease, and other conditions linked to overall health, including heart disease and diabetes.  Please see the attached documents for additional preventive care recommendations.

## 2024-05-11 NOTE — Progress Notes (Signed)
 Subjective:   Larry Richardson is a 68 y.o. who presents for a Medicare Wellness preventive visit.  As a reminder, Annual Wellness Visits don't include a physical exam, and some assessments may be limited, especially if this visit is performed virtually. We may recommend an in-person follow-up visit with your provider if needed.  Visit Complete: Virtual I connected with  [name] on 05/11/24 by a video and audio enabled telemedicine application and verified that I am speaking with the correct person using two identifiers.  Patient Location: Home  Provider Location: Home Office  I discussed the limitations of evaluation and management by telemedicine. The patient expressed understanding and agreed to proceed.  Vital Signs: Because this visit was a virtual/telehealth visit, some criteria may be missing or patient reported. Any vitals not documented were not able to be obtained and vitals that have been documented are patient reported.    Persons Participating in Visit: Patient.  AWV Questionnaire: No: Patient Medicare AWV questionnaire was not completed prior to this visit.  Cardiac Risk Factors include: advanced age (>17men, >40 women);male gender;dyslipidemia     Objective:    Today's Vitals   05/11/24 1131  Weight: 210 lb (95.3 kg)  Height: 6' 2 (1.88 m)   Body mass index is 26.96 kg/m.     05/11/2024   11:45 AM 04/06/2023    8:23 AM 10/06/2021    9:36 AM 01/15/2021    5:08 PM 01/02/2021    5:12 PM 01/02/2021    6:25 AM 12/26/2020    3:23 PM  Advanced Directives  Does Patient Have a Medical Advance Directive? Yes Yes Yes No No Yes Yes  Type of Estate Agent of [place];Living will Healthcare Power of [place];Living will Healthcare Power of [place];Living will   Healthcare Power of [place];Living will Healthcare Power of [place];Living will  Does patient want to make changes to medical advance directive?  No - Patient declined  No - Patient declined  No - Patient declined No - Patient declined No - Patient declined  Copy of Healthcare Power of Attorney in Chart? No - copy requested No - copy requested No - copy requested   No - copy requested Yes - validated most recent copy scanned in chart (See row information)    Current Medications (verified) Outpatient Encounter Medications as of 05/11/2024  Medication Sig   cetirizine (ZYRTEC) 10 MG tablet Take 10 mg by mouth daily.   docusate sodium  (COLACE) 100 MG capsule Take 1 capsule (100 mg total) by mouth 2 (two) times daily. (Patient taking differently: Take 100 mg by mouth daily.)   rosuvastatin  (CRESTOR ) 10 MG tablet Take 1 tablet (10 mg total) by mouth daily.   sildenafil  (REVATIO ) 20 MG tablet 1-3 pills as needed 1 hour prior to intercourse.   No facility-administered encounter medications on file as of 05/11/2024.    Allergies (verified) Patient has no known allergies.   History: Past Medical History:  Diagnosis Date   Basal cell carcinoma    right side of nose 2020, [facility]   Cancer [facility]) 12/2020   renal cell cancer   Chickenpox    Colon polyps    Diverticulosis    Hemorrhoids    Had blood in his stool 12-13 years ago, had colonoscopy and was found to have hemorrhoids   Hyperlipidemia    Past Surgical History:  Procedure Laterality Date   ACHILLES TENDON REPAIR Right 1980   COLONOSCOPY  LAPAROSCOPIC NEPHRECTOMY, HAND ASSISTED Right 01/02/2021   Procedure: HAND ASSISTED LAPAROSCOPIC NEPHRECTOMY;  Surgeon: Penne Knee, MD;  Location: [facility] ORS;  Service: Urology;  Laterality: Right;   Family History  Problem Relation Age of Onset   Hypertension Mother    Hyperlipidemia Mother    Breast cancer Mother 71   Hypertension Father    Leukemia Father    Breast cancer Other        Mom   Prostate cancer Other        Grandfather   Hyperlipidemia Other        Parent   Hypertension Other        Parent   Leukemia Other         Father   Social History   Socioeconomic History   Marital status: Married    Spouse name: Darice   Number of children: 2   Years of education: Not on file   Highest education level: Not on file  Occupational History   Occupation: was a production designer, 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 Feels safe in his home.   Married 42 years   2 girls , 31, 60    2 granddaughters- outside of [place]   Retired   [employer]      His wife goes to [place] during week to take care of kids.   She will be here to help out after surgery.   Social Drivers of Corporate Investment Banker Strain: Low Risk  (05/11/2024)   Overall Financial Resource Strain (CARDIA)    Difficulty of Paying Living Expenses: Not hard at all  Food Insecurity: No Food Insecurity (05/11/2024)   Hunger Vital Sign    Worried About Running Out of Food in the Last Year: Never true    Ran Out of Food in the Last Year: Never true  Transportation Needs: No Transportation Needs (05/11/2024)   PRAPARE - Administrator, Civil Service (Medical): No    Lack of Transportation (Non-Medical): No  Physical Activity: Sufficiently Active (05/11/2024)   Exercise Vital Sign    Days of Exercise per Week: 5 days    Minutes of Exercise per Session: 60 min  Stress: No Stress Concern Present (05/11/2024)   [employer] of Occupational Health - Occupational Stress Questionnaire    Feeling of Stress: Only a little  Social Connections: Socially Integrated (05/11/2024)   Social Connection and Isolation Panel    Frequency of Communication  with Friends and Family: More than three times a week    Frequency of Social Gatherings with Friends and Family: Twice a week    Attends Religious Services: More than 4 times per year    Active Member of [employer] or Organizations: Yes    Attends Engineer, Structural: More than 4 times per year    Marital Status: Married    Tobacco Counseling Counseling given: Not Answered    Clinical Intake:  Pre-visit preparation completed: Yes  Pain : No/denies pain  BMI - recorded: 26.96 Nutritional Status: BMI 25 -29 Overweight Nutritional Risks: None Diabetes: No  Lab Results  Component Value Date   HGBA1C 5.8 06/30/2023   HGBA1C 5.6 06/30/2022   HGBA1C 4.2 (L) 07/01/2021     How often do you need to have someone help you when you read instructions, pamphlets, or other written materials from your doctor or pharmacy?: 1 - Never  Interpreter Needed?: No  Information entered by :: R. Axiel Fjeld LPN   Activities of Daily Living     05/11/2024   11:33 AM  In your present state of health, do you have any difficulty performing the following activities:  Hearing? 0  Vision? 0  Difficulty concentrating or making decisions? 0  Walking or climbing stairs? 0  Dressing or bathing? 0  Doing errands, shopping? 0  Preparing Food and eating ? N  Using the Toilet? N  In the past six months, have you accidently leaked urine? N  Do you have problems with loss of bowel control? N  Managing your Medications? N  Managing your Finances? N  Housekeeping or managing your Housekeeping? N    Patient Care Team: Dineen Rollene MATSU, FNP as PCP - General (Family Medicine) Jaye Fallow, MD as Referring Physician (Ophthalmology) Oksana Anes, MD as Referring Physician (Hematology and Oncology) Tina Damien DEL, MD as Referring Physician (Nephrology)  I have updated your Care Teams any recent Medical Services you may have received from other providers in the past year.     Assessment:    This is a routine wellness examination for Yovanny.  Hearing/Vision screen Hearing Screening - Comments:: No issues Vision Screening - Comments:: readers   Goals Addressed             This Visit's Progress    Patient Stated       Wants to continue to exercise and lose another 5- 10 pounds       Depression Screen     05/11/2024   11:40 AM 09/28/2023   11:12 AM 08/18/2023    8:54 AM 04/06/2023    8:21 AM 06/30/2022    9:00 AM 12/30/2021    8:29 AM 10/06/2021    9:35 AM  PHQ 2/9 Scores  PHQ - 2 Score 0 0 0 0 0 0 0  PHQ- 9 Score 0 0 0 0 0      Fall Risk     05/11/2024   11:36 AM 09/28/2023   11:12 AM 08/18/2023    8:54 AM 04/06/2023    8:24 AM 06/30/2022    9:00 AM  Fall Risk   Falls in the past year? 0 0 0 0 0  Number falls in past yr: 0 0 0 0 0  Injury with Fall? 0 0 0 0 0  Risk for fall due to : No Fall Risks No Fall Risks No Fall Risks No Fall Risks No Fall Risks  Follow up Falls evaluation completed;Falls prevention discussed Falls evaluation completed Falls evaluation completed;Education provided Falls prevention discussed Falls evaluation completed      Data saved with a previous flowsheet row definition    MEDICARE RISK AT HOME:  Medicare Risk at Home Any stairs in or around the home?: Yes If so, are there any without handrails?: No Home free of loose throw rugs in walkways, pet beds, electrical cords, etc?: Yes Adequate lighting in your home to reduce risk of falls?: Yes Life alert?: No Use of a cane, walker or w/c?: No Grab bars in  the bathroom?: No Shower chair or bench in shower?: Yes Elevated toilet seat or a handicapped toilet?: No  TIMED UP AND GO:  Was the test performed?  No  Cognitive Function: 6CIT completed        05/11/2024   11:46 AM 04/06/2023    8:25 AM  6CIT Screen  What Year?  0 points  What month? 0 points 0 points  What time? 0 points 0 points  Count back from 20 0 points 0 points  Months in reverse 0 points 0 points   Repeat phrase 2 points 0 points  Total Score  0 points    Immunizations Immunization History  Administered Date(s) Administered   Fluad Quad(high Dose 65+) 04/25/2022   INFLUENZA, HIGH DOSE SEASONAL PF 05/16/2023   Influenza, Seasonal, Injecte, Preservative Fre 04/17/2018   Influenza-Unspecified 05/12/2016, 03/13/2020, 04/13/2021   Moderna Covid-19 Fall Seasonal Vaccine 67yrs & older 06/22/2023   Moderna Covid-19 Vaccine Bivalent Booster 85yrs & up 04/15/2021   Moderna Sars-Covid-2 Vaccination 09/20/2019, 10/18/2019, 06/01/2020, 12/07/2020   PNEUMOCOCCAL CONJUGATE-20 08/18/2023   Pneumococcal Conjugate-13 06/30/2020   Pneumococcal Polysaccharide-23 09/18/2021   RSV,unspecified 07/01/2022   Td 07/13/1999   Tdap 05/29/2015   Unspecified SARS-COV-2 Vaccination 05/10/2022   Zoster Recombinant(Shingrix) 09/10/2018, 02/09/2019    Screening Tests Health Maintenance  Topic Date Due   Influenza Vaccine  02/10/2024   COVID-19 Vaccine (8 - 2025-26 season) 03/12/2024   Medicare Annual Wellness (AWV)  04/05/2024   Colonoscopy  05/16/2025   DTaP/Tdap/Td (3 - Td or Tdap) 05/28/2025   Pneumococcal Vaccine: 50+ Years  Completed   Hepatitis C Screening  Completed   Zoster Vaccines- Shingrix  Completed   Meningococcal B Vaccine  Aged Out   Mammogram  Discontinued    Health Maintenance Items Addressed: Discussed the need to keep vaccines up to date.  Additional Screening:  Vision Screening: Recommended annual ophthalmology exams for early detection of glaucoma and other disorders of the eye. Is the patient up to date with their annual eye exam?  Yes  Who is the provider or what is the name of the office in which the patient attends annual eye exams?  [facility]  Dental Screening: Recommended annual dental exams for proper oral hygiene  Community Resource Referral / Chronic Care Management: CRR required this visit?  No   CCM required this visit?  No   Plan:    I have  personally reviewed and noted the following in the patient's chart:   Medical and social history Use of alcohol, tobacco or illicit drugs  Current medications and supplements including opioid prescriptions. Patient is not currently taking opioid prescriptions. Functional ability and status Nutritional status Physical activity Advanced directives List of other physicians Hospitalizations, surgeries, and ER visits in previous 12 months Vitals Screenings to include cognitive, depression, and falls Referrals and appointments  In addition, I have reviewed and discussed with patient certain preventive protocols, quality metrics, and best practice recommendations. A written personalized care plan for preventive services as well as general preventive health recommendations were provided to patient.   Angeline Fredericks, LPN   [id number]   After Visit Summary: (MyChart) Due to this being a telephonic visit, the after visit summary with patients personalized plan was offered to patient via MyChart   Notes: Nothing significant to report at this time.

## 2024-05-17 DIAGNOSIS — C480 Malignant neoplasm of retroperitoneum: Principal | ICD-10-CM

## 2024-05-17 DIAGNOSIS — C499 Malignant neoplasm of connective and soft tissue, unspecified: Principal | ICD-10-CM

## 2024-05-22 ENCOUNTER — Inpatient Hospital Stay: Admit: 2024-05-22 | Discharge: 2024-05-22 | Payer: Medicare (Managed Care)

## 2024-05-22 ENCOUNTER — Ambulatory Visit: Admit: 2024-05-22 | Discharge: 2024-05-22 | Payer: Medicare (Managed Care)

## 2024-05-22 ENCOUNTER — Ambulatory Visit
Admit: 2024-05-22 | Discharge: 2024-05-22 | Payer: Medicare (Managed Care) | Attending: Student in an Organized Health Care Education/Training Program | Primary: Student in an Organized Health Care Education/Training Program

## 2024-05-22 DIAGNOSIS — C499 Malignant neoplasm of connective and soft tissue, unspecified: Principal | ICD-10-CM

## 2024-05-22 DIAGNOSIS — C480 Malignant neoplasm of retroperitoneum: Principal | ICD-10-CM

## 2024-05-22 DIAGNOSIS — I2510 Atherosclerotic heart disease of native coronary artery without angina pectoris: Secondary | ICD-10-CM | POA: Diagnosis not present

## 2024-05-22 DIAGNOSIS — Z905 Acquired absence of kidney: Secondary | ICD-10-CM | POA: Diagnosis not present

## 2024-06-04 DIAGNOSIS — C499 Malignant neoplasm of connective and soft tissue, unspecified: Principal | ICD-10-CM

## 2024-06-04 DIAGNOSIS — C480 Malignant neoplasm of retroperitoneum: Principal | ICD-10-CM

## 2024-06-12 DIAGNOSIS — C499 Malignant neoplasm of connective and soft tissue, unspecified: Principal | ICD-10-CM

## 2024-06-12 DIAGNOSIS — C480 Malignant neoplasm of retroperitoneum: Principal | ICD-10-CM

## 2024-06-14 DIAGNOSIS — C499 Malignant neoplasm of connective and soft tissue, unspecified: Principal | ICD-10-CM

## 2024-06-14 DIAGNOSIS — C480 Malignant neoplasm of retroperitoneum: Principal | ICD-10-CM

## 2024-09-27 ENCOUNTER — Encounter: Admitting: Family

## 2025-05-17 ENCOUNTER — Ambulatory Visit
